# Patient Record
Sex: Male | Born: 1935 | Race: White | Hispanic: No | Marital: Married | Smoking: Former smoker
Health system: Southern US, Community
[De-identification: ages and names within clinical notes are randomized; demographics above are authoritative.]

## PROBLEM LIST (undated history)

## (undated) DIAGNOSIS — I739 Peripheral vascular disease, unspecified: Secondary | ICD-10-CM

## (undated) DIAGNOSIS — E039 Hypothyroidism, unspecified: Secondary | ICD-10-CM

## (undated) DIAGNOSIS — N289 Disorder of kidney and ureter, unspecified: Secondary | ICD-10-CM

## (undated) DIAGNOSIS — C189 Malignant neoplasm of colon, unspecified: Secondary | ICD-10-CM

## (undated) DIAGNOSIS — K5790 Diverticulosis of intestine, part unspecified, without perforation or abscess without bleeding: Secondary | ICD-10-CM

## (undated) DIAGNOSIS — E785 Hyperlipidemia, unspecified: Secondary | ICD-10-CM

## (undated) DIAGNOSIS — I6529 Occlusion and stenosis of unspecified carotid artery: Secondary | ICD-10-CM

## (undated) DIAGNOSIS — I10 Essential (primary) hypertension: Secondary | ICD-10-CM

## (undated) DIAGNOSIS — I2510 Atherosclerotic heart disease of native coronary artery without angina pectoris: Secondary | ICD-10-CM

## (undated) DIAGNOSIS — Z8673 Personal history of transient ischemic attack (TIA), and cerebral infarction without residual deficits: Secondary | ICD-10-CM

## (undated) HISTORY — DX: Hyperlipidemia, unspecified: E78.5

## (undated) HISTORY — DX: Disorder of kidney and ureter, unspecified: N28.9

## (undated) HISTORY — PX: HEMORRHOID SURGERY: SHX153

## (undated) HISTORY — DX: Essential (primary) hypertension: I10

## (undated) HISTORY — DX: Atherosclerotic heart disease of native coronary artery without angina pectoris: I25.10

## (undated) HISTORY — DX: Occlusion and stenosis of unspecified carotid artery: I65.29

## (undated) HISTORY — DX: Hypothyroidism, unspecified: E03.9

## (undated) HISTORY — DX: Personal history of transient ischemic attack (TIA), and cerebral infarction without residual deficits: Z86.73

## (undated) HISTORY — DX: Peripheral vascular disease, unspecified: I73.9

## (undated) HISTORY — PX: CAROTID ENDARTERECTOMY: SUR193

## (undated) HISTORY — DX: Diverticulosis of intestine, part unspecified, without perforation or abscess without bleeding: K57.90

## (undated) HISTORY — DX: Malignant neoplasm of colon, unspecified: C18.9

---

## 2001-08-24 ENCOUNTER — Ambulatory Visit (HOSPITAL_COMMUNITY): Admission: RE | Admit: 2001-08-24 | Discharge: 2001-08-24 | Payer: Self-pay | Admitting: Neurosurgery

## 2001-08-24 ENCOUNTER — Encounter: Payer: Self-pay | Admitting: Neurosurgery

## 2003-12-22 ENCOUNTER — Ambulatory Visit (HOSPITAL_COMMUNITY): Admission: RE | Admit: 2003-12-22 | Discharge: 2003-12-22 | Payer: Self-pay | Admitting: Ophthalmology

## 2004-11-03 ENCOUNTER — Ambulatory Visit: Payer: Self-pay | Admitting: Cardiology

## 2004-11-11 ENCOUNTER — Ambulatory Visit: Payer: Self-pay | Admitting: Cardiology

## 2004-11-14 HISTORY — PX: CORONARY ARTERY BYPASS GRAFT: SHX141

## 2004-11-16 ENCOUNTER — Inpatient Hospital Stay (HOSPITAL_COMMUNITY): Admission: RE | Admit: 2004-11-16 | Discharge: 2004-11-16 | Payer: Self-pay | Admitting: Cardiology

## 2004-11-16 ENCOUNTER — Inpatient Hospital Stay (HOSPITAL_COMMUNITY): Admission: AD | Admit: 2004-11-16 | Discharge: 2004-11-24 | Payer: Self-pay | Admitting: Cardiology

## 2004-11-16 ENCOUNTER — Ambulatory Visit: Payer: Self-pay | Admitting: Cardiology

## 2004-11-16 ENCOUNTER — Ambulatory Visit: Payer: Self-pay | Admitting: Gastroenterology

## 2004-12-06 ENCOUNTER — Ambulatory Visit: Payer: Self-pay | Admitting: Cardiology

## 2005-01-11 ENCOUNTER — Ambulatory Visit: Payer: Self-pay | Admitting: Cardiology

## 2005-02-14 HISTORY — PX: HEMICOLECTOMY: SHX854

## 2005-04-04 ENCOUNTER — Ambulatory Visit: Payer: Self-pay | Admitting: Gastroenterology

## 2005-04-22 ENCOUNTER — Encounter (INDEPENDENT_AMBULATORY_CARE_PROVIDER_SITE_OTHER): Payer: Self-pay | Admitting: Specialist

## 2005-04-22 ENCOUNTER — Ambulatory Visit: Payer: Self-pay | Admitting: Gastroenterology

## 2005-05-02 ENCOUNTER — Ambulatory Visit: Payer: Self-pay | Admitting: Cardiology

## 2005-05-12 ENCOUNTER — Ambulatory Visit: Payer: Self-pay | Admitting: Cardiology

## 2005-06-06 ENCOUNTER — Ambulatory Visit: Payer: Self-pay | Admitting: Gastroenterology

## 2005-06-17 ENCOUNTER — Encounter (INDEPENDENT_AMBULATORY_CARE_PROVIDER_SITE_OTHER): Payer: Self-pay | Admitting: *Deleted

## 2005-06-17 ENCOUNTER — Ambulatory Visit: Payer: Self-pay | Admitting: Gastroenterology

## 2005-06-21 ENCOUNTER — Ambulatory Visit: Payer: Self-pay | Admitting: Gastroenterology

## 2005-06-22 ENCOUNTER — Ambulatory Visit: Payer: Self-pay | Admitting: Internal Medicine

## 2005-07-06 ENCOUNTER — Ambulatory Visit: Payer: Self-pay | Admitting: Cardiology

## 2005-08-01 ENCOUNTER — Encounter (INDEPENDENT_AMBULATORY_CARE_PROVIDER_SITE_OTHER): Payer: Self-pay | Admitting: Specialist

## 2005-08-01 ENCOUNTER — Inpatient Hospital Stay (HOSPITAL_COMMUNITY): Admission: RE | Admit: 2005-08-01 | Discharge: 2005-08-08 | Payer: Self-pay | Admitting: Surgery

## 2005-08-03 ENCOUNTER — Ambulatory Visit: Payer: Self-pay | Admitting: Internal Medicine

## 2006-05-15 ENCOUNTER — Ambulatory Visit: Payer: Self-pay | Admitting: Cardiology

## 2006-06-05 ENCOUNTER — Ambulatory Visit: Payer: Self-pay | Admitting: Gastroenterology

## 2006-06-29 ENCOUNTER — Emergency Department (HOSPITAL_COMMUNITY): Admission: EM | Admit: 2006-06-29 | Discharge: 2006-06-29 | Payer: Self-pay | Admitting: Emergency Medicine

## 2006-07-04 ENCOUNTER — Encounter: Payer: Self-pay | Admitting: Gastroenterology

## 2006-07-11 ENCOUNTER — Ambulatory Visit: Payer: Self-pay | Admitting: Gastroenterology

## 2006-07-31 ENCOUNTER — Encounter: Payer: Self-pay | Admitting: Gastroenterology

## 2006-07-31 ENCOUNTER — Ambulatory Visit: Payer: Self-pay | Admitting: Gastroenterology

## 2006-12-22 ENCOUNTER — Ambulatory Visit: Payer: Self-pay | Admitting: Cardiology

## 2006-12-28 ENCOUNTER — Encounter: Payer: Self-pay | Admitting: Urology

## 2006-12-28 ENCOUNTER — Ambulatory Visit (HOSPITAL_BASED_OUTPATIENT_CLINIC_OR_DEPARTMENT_OTHER): Admission: RE | Admit: 2006-12-28 | Discharge: 2006-12-28 | Payer: Self-pay | Admitting: Urology

## 2007-05-30 ENCOUNTER — Encounter: Payer: Self-pay | Admitting: Cardiology

## 2007-07-10 ENCOUNTER — Encounter: Payer: Self-pay | Admitting: Physician Assistant

## 2007-07-10 ENCOUNTER — Ambulatory Visit: Payer: Self-pay | Admitting: Cardiology

## 2007-09-18 ENCOUNTER — Encounter: Payer: Self-pay | Admitting: Cardiology

## 2007-11-07 ENCOUNTER — Encounter: Payer: Self-pay | Admitting: Cardiology

## 2007-12-06 ENCOUNTER — Encounter: Payer: Self-pay | Admitting: Cardiology

## 2007-12-17 ENCOUNTER — Encounter: Payer: Self-pay | Admitting: Cardiology

## 2007-12-19 ENCOUNTER — Encounter: Payer: Self-pay | Admitting: Gastroenterology

## 2008-02-22 ENCOUNTER — Ambulatory Visit: Payer: Self-pay | Admitting: Cardiology

## 2008-07-09 ENCOUNTER — Encounter (INDEPENDENT_AMBULATORY_CARE_PROVIDER_SITE_OTHER): Payer: Self-pay | Admitting: *Deleted

## 2008-07-23 ENCOUNTER — Ambulatory Visit: Payer: Self-pay | Admitting: Gastroenterology

## 2008-07-29 ENCOUNTER — Encounter: Payer: Self-pay | Admitting: Cardiology

## 2008-08-07 ENCOUNTER — Ambulatory Visit (HOSPITAL_COMMUNITY): Admission: RE | Admit: 2008-08-07 | Discharge: 2008-08-07 | Payer: Self-pay | Admitting: Internal Medicine

## 2008-08-12 ENCOUNTER — Telehealth: Payer: Self-pay | Admitting: Gastroenterology

## 2008-08-20 ENCOUNTER — Encounter: Payer: Self-pay | Admitting: Gastroenterology

## 2008-08-20 ENCOUNTER — Ambulatory Visit: Payer: Self-pay | Admitting: Gastroenterology

## 2008-08-22 ENCOUNTER — Encounter: Payer: Self-pay | Admitting: Gastroenterology

## 2008-11-05 ENCOUNTER — Encounter: Payer: Self-pay | Admitting: Cardiology

## 2008-11-13 ENCOUNTER — Ambulatory Visit: Payer: Self-pay | Admitting: Cardiology

## 2008-11-13 ENCOUNTER — Encounter: Payer: Self-pay | Admitting: Physician Assistant

## 2008-11-14 ENCOUNTER — Encounter: Payer: Self-pay | Admitting: Physician Assistant

## 2008-11-14 ENCOUNTER — Encounter (INDEPENDENT_AMBULATORY_CARE_PROVIDER_SITE_OTHER): Payer: Self-pay | Admitting: *Deleted

## 2008-11-14 ENCOUNTER — Ambulatory Visit: Payer: Self-pay | Admitting: Cardiology

## 2008-11-14 DIAGNOSIS — E785 Hyperlipidemia, unspecified: Secondary | ICD-10-CM | POA: Insufficient documentation

## 2008-11-14 DIAGNOSIS — I359 Nonrheumatic aortic valve disorder, unspecified: Secondary | ICD-10-CM | POA: Insufficient documentation

## 2008-11-14 DIAGNOSIS — E039 Hypothyroidism, unspecified: Secondary | ICD-10-CM | POA: Insufficient documentation

## 2008-11-14 DIAGNOSIS — I10 Essential (primary) hypertension: Secondary | ICD-10-CM

## 2008-11-14 DIAGNOSIS — I25810 Atherosclerosis of coronary artery bypass graft(s) without angina pectoris: Secondary | ICD-10-CM

## 2008-11-18 ENCOUNTER — Telehealth (INDEPENDENT_AMBULATORY_CARE_PROVIDER_SITE_OTHER): Payer: Self-pay | Admitting: *Deleted

## 2008-11-18 ENCOUNTER — Encounter (INDEPENDENT_AMBULATORY_CARE_PROVIDER_SITE_OTHER): Payer: Self-pay | Admitting: *Deleted

## 2008-11-19 ENCOUNTER — Ambulatory Visit: Payer: Self-pay

## 2008-11-19 ENCOUNTER — Ambulatory Visit: Payer: Self-pay | Admitting: Cardiology

## 2008-11-19 ENCOUNTER — Encounter (HOSPITAL_COMMUNITY): Admission: RE | Admit: 2008-11-19 | Discharge: 2008-11-19 | Payer: Self-pay | Admitting: Cardiology

## 2008-11-24 ENCOUNTER — Encounter (INDEPENDENT_AMBULATORY_CARE_PROVIDER_SITE_OTHER): Payer: Self-pay | Admitting: *Deleted

## 2008-12-18 ENCOUNTER — Telehealth (INDEPENDENT_AMBULATORY_CARE_PROVIDER_SITE_OTHER): Payer: Self-pay | Admitting: *Deleted

## 2008-12-22 ENCOUNTER — Ambulatory Visit: Payer: Self-pay | Admitting: Cardiology

## 2009-01-21 ENCOUNTER — Ambulatory Visit (HOSPITAL_COMMUNITY): Admission: RE | Admit: 2009-01-21 | Discharge: 2009-01-21 | Payer: Self-pay | Admitting: Surgery

## 2009-01-29 ENCOUNTER — Encounter: Payer: Self-pay | Admitting: Gastroenterology

## 2009-02-17 ENCOUNTER — Encounter: Payer: Self-pay | Admitting: Cardiology

## 2009-04-07 ENCOUNTER — Ambulatory Visit (HOSPITAL_COMMUNITY): Admission: RE | Admit: 2009-04-07 | Discharge: 2009-04-08 | Payer: Self-pay | Admitting: Surgery

## 2009-04-23 ENCOUNTER — Encounter: Payer: Self-pay | Admitting: Cardiology

## 2010-03-14 LAB — CONVERTED CEMR LAB
ALT: 18 units/L (ref 0–53)
AST: 23 units/L (ref 0–37)
Albumin: 3.8 g/dL (ref 3.5–5.2)
Alkaline Phosphatase: 73 units/L (ref 39–117)
Bilirubin, Direct: 0.1 mg/dL (ref 0.0–0.3)
Cholesterol: 135 mg/dL (ref 0–200)
HDL: 35.5 mg/dL — ABNORMAL LOW (ref >39.00)
LDL Cholesterol: 84 mg/dL (ref 0–99)
Total Bilirubin: 0.7 mg/dL (ref 0.3–1.2)
Total CHOL/HDL Ratio: 4
Total Protein: 7.4 g/dL (ref 6.0–8.3)
Triglycerides: 79 mg/dL (ref 0.0–149.0)
VLDL: 15.8 mg/dL (ref 0.0–40.0)

## 2010-03-18 NOTE — Letter (Signed)
Summary: Followup Visit / [facility]  Followup Visit / [facility]   Imported By: Lennie Odor 02/19/2009 12:50:14  _____________________________________________________________________  External Attachment:    Type:   Image     Comment:   External Document

## 2010-03-18 NOTE — Letter (Signed)
Summary: Dr Molli Hazard Tsuei's Office Note  Dr Molli Hazard Tsuei's Office Note   Imported By: Roderic Ovens 03/16/2009 16:14:23  _____________________________________________________________________  External Attachment:    Type:   Image     Comment:   External Document

## 2010-03-18 NOTE — Letter (Signed)
Summary: Dr Wilmon Arms Tsuei's Office Note  Dr Wilmon Arms Tsuei's Office Note   Imported By: Roderic Ovens 05/18/2009 12:21:16  _____________________________________________________________________  External Attachment:    Type:   Image     Comment:   External Document

## 2010-05-05 LAB — DIFFERENTIAL
Basophils Absolute: 0.1 10*3/uL (ref 0.0–0.1)
Basophils Relative: 1 % (ref 0–1)
Eosinophils Absolute: 0.2 10*3/uL (ref 0.0–0.7)
Eosinophils Relative: 3 % (ref 0–5)
Lymphocytes Relative: 23 % (ref 12–46)
Lymphs Abs: 1.4 10*3/uL (ref 0.7–4.0)
Monocytes Absolute: 0.7 10*3/uL (ref 0.1–1.0)
Monocytes Relative: 11 % (ref 3–12)
Neutro Abs: 3.9 10*3/uL (ref 1.7–7.7)
Neutrophils Relative %: 61 % (ref 43–77)

## 2010-05-05 LAB — BASIC METABOLIC PANEL
BUN: 25 mg/dL — ABNORMAL HIGH (ref 6–23)
CO2: 26 mEq/L (ref 19–32)
Calcium: 9.4 mg/dL (ref 8.4–10.5)
Chloride: 108 mEq/L (ref 96–112)
Creatinine, Ser: 1.64 mg/dL — ABNORMAL HIGH (ref 0.4–1.5)
GFR calc Af Amer: 50 mL/min — ABNORMAL LOW (ref >60)
GFR calc non Af Amer: 41 mL/min — ABNORMAL LOW (ref >60)
Glucose, Bld: 109 mg/dL — ABNORMAL HIGH (ref 70–99)
Potassium: 4.8 mEq/L (ref 3.5–5.1)
Sodium: 139 mEq/L (ref 135–145)

## 2010-05-05 LAB — CBC
HCT: 43 % (ref 39.0–52.0)
Hemoglobin: 14.8 g/dL (ref 13.0–17.0)
MCHC: 34.4 g/dL (ref 30.0–36.0)
MCV: 96.3 fL (ref 78.0–100.0)
Platelets: 140 10*3/uL — ABNORMAL LOW (ref 150–400)
RBC: 4.47 MIL/uL (ref 4.22–5.81)
RDW: 13.7 % (ref 11.5–15.5)
WBC: 6.3 10*3/uL (ref 4.0–10.5)

## 2010-05-18 LAB — CBC
HCT: 43.2 % (ref 39.0–52.0)
Hemoglobin: 14.6 g/dL (ref 13.0–17.0)
MCHC: 33.9 g/dL (ref 30.0–36.0)
MCV: 95 fL (ref 78.0–100.0)
Platelets: 149 10*3/uL — ABNORMAL LOW (ref 150–400)
RBC: 4.55 MIL/uL (ref 4.22–5.81)
RDW: 15.5 % (ref 11.5–15.5)
WBC: 6.2 10*3/uL (ref 4.0–10.5)

## 2010-05-18 LAB — DIFFERENTIAL
Basophils Absolute: 0 10*3/uL (ref 0.0–0.1)
Basophils Relative: 1 % (ref 0–1)
Eosinophils Absolute: 0.2 10*3/uL (ref 0.0–0.7)
Monocytes Relative: 10 % (ref 3–12)
Neutrophils Relative %: 66 % (ref 43–77)

## 2010-05-18 LAB — BASIC METABOLIC PANEL
BUN: 20 mg/dL (ref 6–23)
CO2: 27 mEq/L (ref 19–32)
Calcium: 9.4 mg/dL (ref 8.4–10.5)
Chloride: 108 mEq/L (ref 96–112)
Creatinine, Ser: 1.41 mg/dL (ref 0.4–1.5)
GFR calc Af Amer: 59 mL/min — ABNORMAL LOW (ref >60)
GFR calc non Af Amer: 49 mL/min — ABNORMAL LOW (ref >60)
Glucose, Bld: 107 mg/dL — ABNORMAL HIGH (ref 70–99)
Potassium: 4.8 mEq/L (ref 3.5–5.1)
Sodium: 140 mEq/L (ref 135–145)

## 2010-06-29 NOTE — Assessment & Plan Note (Signed)
[facility]                          [facility]   NAME:SCOTTNealy, Karapetian                       MRN:          [id number]  DATE:12/22/2006                            DOB:          [birth year]    Mr. Maya is doing well.  He is post coronary artery bypass grafting.  He is doing well, he is not having any chest pain.  He has carotid  disease and needs a followup Doppler.  He does have peripheral vascular  disease, but he walks.  He had cut back on his walking routine when he  had a problem with his colon, and he tells me that he has increased it  again and will work on doing better.  He is not having any syncope or  presyncope.   PAST MEDICAL HISTORY:   ALLERGIES:  He has had muscles aches from ZOCOR, but he does tolerate  Crestor.   MEDICATIONS:  1. Thyroid.  2. Aspirin (aspirin currently on hold for a prostate procedure, and      then it will be restarted).  3. Fish oil.  4. Lisinopril 40.  5. Crestor 10.  6. Nifedipine.  7. An antibiotic for tooth abscess that will be completed in a few      days.   OTHER MEDICAL PROBLEMS:  See the list below.   REVIEW OF SYSTEMS:  Recently he was actually admitted overnight with a  fever.  He received antibiotics and there was question that he had  bronchitis.  However, it turned out the next day that it clearly was  related to an abscess in a tooth that was drained, and he is on  antibiotics and doing much better.  Otherwise, his review of systems is  negative.   PHYSICAL EXAMINATION:  Blood pressure is 168/76 with a pulse of 70.  At  home his blood pressure, systolic, is normal on a repeated basis.  His  weight is 191 pounds.  The patient is oriented to person, time, and place.  Affect is normal.  HEENT:  Reveals no xanthelasma.  He has normal extraocular  motion.  The  patient has bilateral carotid bruits.  There is no jugular venous  distension.  CARDIAC EXAM:  Reveals an S1 with an  S2.  There are no clicks or  significant murmurs.  LUNGS:  Clear.  Respiratory effort is not labored.  The abdomen is soft.  He has normal bowel sounds.  He has no significant peripheral edema.   No labs are done today.   PROBLEMS:  1. History of a CVA in the past with no residual.  2. Hypertension.  He needs close attention to his blood pressure, but      he insists that it is normal at home.  3. History of disability.  4. Status post left carotid endarterectomy by Dr. Hart Rochester in the past.      He has a right and left carotid bruit today.  I do not have any      recent Dopplers and these will be ordered and be done soon.  5.  Hypothyroidism, on medication.  6. Coronary artery disease, post 4-vessel coronary artery bypass      grafting in October of 2006.  7. Ejection fraction in the 50% range by catheterization.  8. Decreased ABIs bilaterally in his legs, but he is active and not      having any rest pain.  9. History of tobacco use historically.  10.Intolerance to ZOCOR and GEMFIBROZIL, but he tolerates Crestor.  11.History of mild renal insufficiency.  12.History of a small colon cancer that was removed successfully, and      he is followed through the cancer center.   The patient is doing well.  He is stable and I will see him back in 6  months.     Luis Abed, MD, [facility]  Electronically Signed    JDK/MedQ  DD: 12/22/2006  DT: 12/23/2006  Job #: [id number]   cc:   Juanetta Beets, MD, [facility]

## 2010-06-29 NOTE — Op Note (Signed)
NAME:  Darius Barber, Darius Barber                ACCOUNT NO.:  [id number]   MEDICAL RECORD NO.:  [id number]          PATIENT TYPE:  AMB   LOCATION:  [facility]                         FACILITY:  [facility]   PHYSICIAN:  Excell Seltzer. Annabell Howells, M.D.    DATE OF BIRTH:  27-Jun-1931   DATE OF PROCEDURE:  12/28/2006  DATE OF DISCHARGE:  12/28/2006                               OPERATIVE REPORT   PROCEDURE:  1. Prostate ultrasound.  2. Ultrasound guided needle biopsy of the prostate.   PREOPERATIVE DIAGNOSIS:  Elevated PSA.   POSTOPERATIVE DIAGNOSIS:  Elevated PSA.   SURGEON:  Excell Seltzer. Annabell Howells, M.D.   ANESTHESIA:  General.   SPECIMEN:  Prostate biopsies from the right and left base, apex, and mid  prostate.   COMPLICATIONS:  None.   INDICATIONS:  Darius Barber is a 77 year old white male with an elevated PSA  who could not tolerate office prostate biopsy.   FINDINGS AND PROCEDURE:  The patient is taken to the operating room.  He  received Cipro and Rocephin.  A general anesthetic was induced.  He was  placed in the lateral decubitus position.  A probe was inserted,  scanning was performed.  Examination revealed normal seminal vesicles.  The peripheral zone was generally isoechoic without obvious hypoechoic  lesions.  The transitional zone was slightly enlarged with normal  variegated echo texture. Sagittal views revealed no additional  abnormalities.  The prostate and seminal vesicle angle was unremarkable  and prostate volume is 42 mL.  After completion of diagnostic scan, a 12  core pattern biopsy was obtained in the routine fashion without  complications.  At this point, the probe was removed, the patient's  anesthetic was reversed, he was taken to the recovery room in stable  condition.  There were no complications.      Excell Seltzer. Annabell Howells, M.D.  Electronically Signed     JJW/MEDQ  D:  12/29/2006  T:  12/30/2006  Job:  [id number]

## 2010-06-29 NOTE — Assessment & Plan Note (Signed)
[facility]                          [facility]   Darius Barber, Darius Barber                       MRN:          [id number]  DATE:07/10/2007                            DOB:          March 07, 1931    PRIMARY CARDIOLOGIST:  Luis Abed, MD, [facility]   REASON FOR VISIT:  Scheduled followup.   Since last seen here in the clinic in November 2008, Darius Barber continues  to report no exertional chest discomfort.  In fact, he states that he  has never had any.  He is now nearly 3 years out since undergoing four-  vessel CABG in October 2006 for treatment of multivessel CAD.  LV  function was preserved by cardiac catheterization.  He has not had any  subsequent ischemic testing.   The patient also has severe cerebrovascular disease, having undergone  prior left carotid endarterectomy.  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  Surveillance Dopplers, ordered by Dr. Myrtis Ser when the  patient was last seen in the clinic, did suggest progression on the left  side with estimated 70-79% stenosis.  The right ICA remained completely  occluded.   Most recent lipid profile from this past March reveals total cholesterol  150, triglyceride 106, HDL 38 and LDL 91.   The patient is on Crestor, but only at 10 mg daily.  He was not able to  tolerate 20 mg daily or in divided dose of 10 mg b.i.d., secondary to  significant muscle ache.   The patient also has known, severe peripheral vascular disease, with  previously documented ABIs of 0.5-0.6 range in October 2006.  He  continues to walk on a regular basis and denies any worsening of his  chronic intermittent claudication, which is worse on the right.   The patient has remote history of tobacco smoking.   Electrocardiogram today reveals an normal sinus rhythm at 60 beats per  minute with the LAD and poor R-wave progression and no acute changes.   MEDICATIONS:  1. Lisinopril 40 nightly.  2. Nifedipine  60 daily.  3. Crestor 10 daily.  4. Fish oil 1000 mg daily.  5. Levothyroxine 0.1255 daily.  6. Finasteride 5 daily.  7. Aspirin 81 daily   PHYSICAL EXAMINATION:  VITAL SIGNS:  Blood pressure 153/86, pulse 72,  regular. Weight 194.  GENERAL:  A 77 year old male, sitting upright, no distress.  HEENT:  Normocephalic and atraumatic.  NECK:  Palpable carotid pulses with bilateral bruits (left greater than  right); no JVD.  LUNGS:  Clear to auscultation all fields.  HEART:  Regular rhythm (S1, S2), no significant murmurs.  No rubs.  ABDOMEN:  Protuberant, nontender, intact bowel sounds.  EXTREMITIES:  Minimally palpable dorsalis pedis pulses with trace pedal  edema.  NEUROLOGIC:  No focal deficit.   IMPRESSION:  1. Multivessel coronary artery disease.      a.     Four-vessel coronary artery bypass grafting, October 2006.      b.     Ejection fraction of 50% by catheterization.  2. Severe peripheral vascular disease.  a.     Known 100% right internal carotid artery; 70-79% left       internal carotid artery stenosis by carotid Dopplers, November       2008.      b.     Status post left carotid endarterectomy.      c.     Chronic, intermittent claudication (right greater left),       treated medically.  3. Hypertension.  4. Dyslipidemia, intolerant to ZOCOR and GEMFIBROZIL.  5. History of stroke.  6. History of mild renal insufficiency.  7. Treated hypothyroidism.   PLAN:  1. Surveillance carotid Dopplers to rule out significant progression      since his previous study in November.  2. Rechallenge with increased dose of Crestor, but in divided dose of      10 mg b.i.d.  I emphasized the need to try to attain an LDL target      of 70 or less, if at all possible.  The patient is willing to try      the higher dose and we will see if he can tolerate a certain level      of myalgia at this higher dose.  He will then need followup      FLP/LFTs in 12 weeks.  3. Otherwise  continue current medication regimen for now.  However, I      am concerned with respect to his uncontrolled hypertension.  I      suggested a twice-daily blood pressure log for 2 weeks, as well as      occasional readings in public settings.  We can review this data at      time of his next visit and make further recommendations with      respect to his antihypertensive regimen.  4. Schedule return clinic followup with myself and Dr. Myrtis Ser in 6      months.  Of note, we will defer scheduling ischemic diagnostic test      at this point in time, given that the patient has      remained asymptomatic since undergoing bypass surgery in 2006.  He      will be 3 years out this coming October.      Rozell Searing, PA-C  Electronically Signed      Luis Abed, MD, [facility]  Electronically Signed   GS/MedQ  DD: 07/10/2007  DT: 07/10/2007  Job #: [id number]   cc:   Doreen Beam, MD

## 2010-06-29 NOTE — Assessment & Plan Note (Signed)
[facility]                          [facility]   NAME:Darius Barber, Darius Barber                       MRN:          [id number]  DATE:02/22/2008                            DOB:          26-Jun-1931    Darius Barber is seen for Cardiology followup.  He is actually doing well.  He underwent CABG in 2006.  He is having no significant symptoms.  He  has good LV function.  He does not need followup testing.   He does have significant carotid disease.  He is status post left  carotid endarterectomy.  He has total occlusion of the right carotid and  he has a return of 70-79% stenosis of the left.  We arranged for  followup Dopplers that were done on July 17, 2007.  That study showed no  significant change with total occlusion of the right and 70-79% stenosis  of the left.  Vertebral arteries were patent.   Today, he is doing well.   ALLERGIES:  He has felt poorly with the ZOCOR and GEMFIBROZIL.   MEDICATIONS:  See the flow sheet and the MR.   OTHER MEDICAL PROBLEMS:  See the list on the note of Jul 10, 2007.   REVIEW OF SYSTEMS:  He has no GI or GU symptoms.  He has no fevers or  chills or headaches.  There are no skin rashes.  His review of systems  otherwise is negative.   PHYSICAL EXAMINATION:  VITAL SIGNS:  Blood pressure is 145/72 with a  pulse of 79.  Weight is 201 pounds.  NEUROLOGIC:  The patient is oriented to person, time, and place.  Affect  is normal.  HEENT:  No xanthelasma.  He has normal extraocular motion.  NECK:  There is a left carotid bruit.  There is no jugular venous  distention.  LUNGS:  Clear.  Respiratory effort is not labored.  CARDIAC:  S1 and S2.  There is a 2/6 systolic murmur.  ABDOMEN:  Soft.  EXTREMITIES:  He has no peripheral edema.   PROBLEMS:  1. History of coronary artery disease with four-vessel coronary artery      bypass graft in June 2006.  2. History of ejection fraction 50%.  3. Total occlusion of the  right carotid and 70-79% stenosis of the      left internal carotid artery.  He needs followup Dopplers.  4. Status post left carotid endarterectomy.  5. Some claudication, but this is stable.  6. Hypertension, treated.  7. Dyslipidemia, on Crestor, intolerant to Zocor and gemfibrozil.  8. History of a stroke.  9. History of mild renal insufficiency.  10.Hypothyroidism, treated.   He needs continued aggressive secondary prevention.  It is time for  followup carotid Doppler, this will be done.  I will see him back in 6  months.     Luis Abed, MD, [facility]  Electronically Signed    JDK/MedQ  DD: 02/22/2008  DT: 02/23/2008  Job #: [id number]   cc:   Franchot Heidelberg, M.D.

## 2010-06-29 NOTE — Assessment & Plan Note (Signed)
 HEALTHCARE                         GASTROENTEROLOGY OFFICE NOTE   NAME:SCOTTTracen, Mahler                       MRN:          [id number]  DATE:07/11/2006                            DOB:          1931/08/07    PRIMARY CARE PHYSICIAN:  Dr. Wende Crease.   SURGEON:  Wilmon Arms. Corliss Skains, M.D.   GASTROINTESTINAL PROBLEM LIST:  Colon cancer discovered on March 2007,  colonoscopy transverse colon cancer, small.  Underwent left  hemicolectomy by Dr. Marcille Blanco, June 2007, T1 N0 M0 colon cancer.  Scheduled for a repeat colonoscopy June 2008.   INTERVAL HISTORY:  I last saw Cebert about five weeks ago.  I was  suspicious that he was having some GERD related dyspeptic symptoms.  He  began taking Protonix and did not really notice much of a difference.  Started on Gas-X and apple vinegar and said that definitely helped him.  He presented to the emergency room recently with abdominal discomforts  and chest discomforts.  Imaging studies were normal and he was given the  diagnosis of possible irritable bowel, sent home on pain medicines and  antispasmodics by the ER physician.  He empirically started himself back  on Cipro and Flagyl that he has had laying around for history of  diverticulitis.  He said shortly after starting that, his pains went  away.   CURRENT MEDICATIONS:  1. Levothyroxine.  2. Baby aspirin.  3. Lisinopril.  4. Nifedipine.  5. Rosuvastatin.  6. Cipro/Flagyl on a p.r.n. basis.  7. GlycoLax.   PHYSICAL EXAMINATION:  GENERAL APPEARANCE:  Generally well-appearing.  VITAL SIGNS:  Weight is 195 pounds, which is down 3 pounds since his  last visit.  Blood pressure 148/88, pulse 68.  ABDOMEN:  Soft, nontender, nondistended, normal bowel sounds.  EXTREMITIES:  No lower extremity edema.   ASSESSMENT/PLAN:  A 77 year old man with personal history of colon  cancer and dyspeptic symptoms.   He has been having abdominal bloating after he eats that  is generally  helped by Gas-X and apple vinegar.  He says he has a feeling that if he  could belch, that things would definitely improved.  I recommended he  take peppermint on an as needed basis.  It is not clear that he has been  having bouts of diverticulitis.  Certainly has never had CT scan  confirming this.  Do not recommend he take empiric antibiotics without  at least calling one of his physicians first.  He is  due for a colonoscopy for one year follow-up after colon cancer  resection.  We will arrange for that to be done in the next two to three  weeks.     Rachael Fee, MD  Electronically Signed    DPJ/MedQ  DD: 07/11/2006  DT: 07/11/2006  Job #: [id number]   cc:   Wilmon Arms. Tsuei, M.D.  Wende Crease, M.D.

## 2010-07-02 NOTE — Assessment & Plan Note (Signed)
[facility]                         GASTROENTEROLOGY OFFICE NOTE   NAME:SCOTTEluzer, Howdeshell                       MRN:          [id number]  DATE:06/05/2006                            DOB:          1931-07-17    PRIMARY CARE PHYSICIAN:  Dr. Wende Crease.   GASTROINTESTINAL PROBLEM LIST:  Colon cancer discovered on March 2007,  colonoscopy transverse colon cancer, small.  Underwent left  hemicolectomy by Dr. Marcille Blanco, June 2007, T1 N0 M0 colon cancer.  Scheduled for a repeat colonoscopy June 2008.   INTERVAL HISTORY:  I last saw Mr. Villacis about a year ago around the time  I initially diagnosed him with colon cancer.  Since then, he has  undergone left hemicolectomy.  This was a T1 lesion.  He required no  adjuvant chemotherapy.  He is already in our reminder system for a  colonoscopy in June 2008.  He actually comes here now for some new  discomfort.  He has mild lower GI discomfort as well as some burning in  his epigastrium, especially when he lays down.  He had lab tests and  imaging studies recently.  He tells me what sounds like a plain  abdominal film was normal.  Lab testing including a CBC and a complete  metabolic profile were all essentially normal.  He was put on omeprazole  20 mg which he has been taking 20-30 minutes prior to his breakfast  meal.  He says on that, he has definitely improved, but his symptoms are  not completely gone.  He drinks 2 large coffees daily and eats within 2-  3 hours of lying down for bed.   CURRENT MEDICATIONS:  Levothyroxine, __________, Lisinopril, nifedipine,  Rosuvastatin, omeprazole.   PHYSICAL EXAMINATION:  VITAL SIGNS:  Weight 198 pounds which is 10  pounds heavier than he was a year ago.  Blood pressure 170/82, pulse 80.  CONSTITUTIONAL:  General well-appearing.  LUNGS:  Clear to auscultation bilaterally.  ABDOMEN:  Soft, nontender, nondistended, normal bowel sounds.   ASSESSMENT/PLAN:  A 77 year old  man with personal history of colon  cancer, new likely gastroesophageal reflux disease symptoms.   I will give him samples of Protonix 40 mg.  He will take these 20-30  minutes prior to his breakfast meal.  I also recommend he cut back on  his daily caffeine and try not to eat within 2-3 hours of lying down for  bed.  We have also given him a gastroesophageal reflux disease handout  to summarize these and other lifestyle modifications.  He will return to  see me in 4 weeks' time and if his symptoms are not improved by then, we  will have to proceed with upper  endoscopy.  He has no worrisome findings of anemia, overt  gastrointestinal bleeding, or dysphagia.     Rachael Fee, MD  Electronically Signed    DPJ/MedQ  DD: 06/05/2006  DT: 06/05/2006  Job #: [id number]   cc:   Wende Crease

## 2010-07-02 NOTE — Op Note (Signed)
NAME:  Darius Barber, Darius Barber                ACCOUNT NO.:  [id number]   MEDICAL RECORD NO.:  [id number]          PATIENT TYPE:  INP   LOCATION:  0005                         FACILITY:  [facility]   PHYSICIAN:  Wilmon Arms. Corliss Skains, M.D. DATE OF BIRTH:  1931-06-21   DATE OF PROCEDURE:  DATE OF DISCHARGE:                                 OPERATIVE REPORT   PREOPERATIVE DIAGNOSIS:  Transverse colon cancer.   POSTOPERATIVE DIAGNOSIS:  Transverse colon cancer.   PROCEDURE PERFORMED:  Left hemicolectomy.   SURGEON:  Wilmon Arms. Corliss Skains, M.D.   ASSISTANT:  Dr. Kendrick Ranch.   ANESTHESIA:  General endotracheal.   INDICATIONS:  The patient is a 77 year old male who has a significant  cardiac history who presents after a colonoscopy for chronic constipation  and intermittent lower abdominal pain.  He was initially seen by Dr. Wendall Papa who performed a colonoscopy in March this year.  At that time a 12 mm  sessile polyp was removed from the transverse colon in piecemeal fashion.  He did not feel he completely removed the polyp.  Pathology at that time  showed a tubular adenoma with focal high-grade dysplasia.  He brought the  patient back on May4 for repeat colonoscopy and removed what appeared to be  residual tumor at the prior polypectomy site.  Fortunately he did  polypectomy site with [place] ink.  The pathology at that time showed  adenocarcinoma.  The patient underwent a cardiac workup for presurgical  clearance.  He presents today for resection.   DESCRIPTION OF PROCEDURE:  The patient is brought to the operating room,  placed supine position on the operating room table.  After an adequate level  of general endotracheal anesthesia was obtained, the patient's abdomen was  shaved, prepped with Betadine and draped in sterile fashion.  A Foley  catheter had been placed under sterile technique.  A time-out was then taken  assure proper patient, proper procedure.  An upper midline incision was made  just below  the xiphoid to just below the umbilicus.  The peritoneal cavity  was entered.  Exploration of abdomen showed no liver lesions.  The stomach  appeared to be normal.  We were able to thread the nasogastric tube into the  distal stomach.  We were able to identify the inked site in the transverse  colon near the splenic flexure.  The splenic flexure was fairly high.  We  inserted the Balfour retractor.  We then began mobilizing the left colon by  dividing the white line of Toldt with cautery.  Blunt dissection was used to  mobilize the colon medially.  We continued our dissection up around the  splenic flexure.  We were able mobilize this down.  The LigaSure device was  used to dissect the greater omentum off the transverse colon in the distal  half.  Once we were satisfied that we had adequately mobilized the colon, we  divided the transverse colon just distal to the middle colic artery.  This  was performed with a GIA stapler.  The LigaSure device was used to divide  the  mesentery heading distally.  Once we had an adequate distal length in  the mid descending colon, we divided the colon distally with another firing  of the GIA stapler.  The specimen was sent for pathologic examination with a  suture to marked the proximal end.  We were able to palpate a small polyp  under the inked area.  We then reinspected for hemostasis.  We created our  anastomosis by using a GIA stapler in a side-to-side functional end-to-end  anastomosis.  The enterotomy site was inspected for hemostasis and then was  closed with a TA 60 stapler.  A reinforcing crotch stitch of 2-0 silk was  placed.  The mesenteric defect was closed with figure-of-eight 2-0 silk  sutures.  The anastomosis was inspected and it was felt to be widely patent  and there was no obvious leak.  The abdomen was then thoroughly irrigated.  Hemostasis was felt to be adequate.  All the sponge, instrument and needle  counts were correct.  The fascia  was then closed with a double-stranded #1  PDS suture.  The subcutaneous tissues were then irrigated and skin staples  were used to close skin.  The patient tolerated the procedure well.  He was  extubated, brought to recovery in stable condition.  All sponge, instrument  and needle counts correct.      Wilmon Arms. Tsuei, M.D.  Electronically Signed     MKT/MEDQ  D:  08/01/2005  T:  08/02/2005  Job:  [id number]   cc:   Rachael Fee, M.D.

## 2010-07-02 NOTE — Op Note (Signed)
NAME:  Darius Barber, Darius Barber NO.:  [id number]   MEDICAL RECORD NO.:  [id number]          PATIENT TYPE:  INP   LOCATION:  2303                         FACILITY:  [facility]   PHYSICIAN:  Salvatore Decent. [name], M.D.DATE OF BIRTH:  27-Oct-1931   DATE OF PROCEDURE:  11/17/2004  DATE OF DISCHARGE:                                 OPERATIVE REPORT   PREOPERATIVE DIAGNOSIS:  Left main and three-vessel coronary disease with  exertional angina.   POSTOPERATIVE DIAGNOSIS:  Left main and three-vessel coronary disease with  exertional angina.   PROCEDURE:  Median sternotomy, extracorporeal circulation, coronary artery  bypass grafting x4 (left internal mammary artery to left anterior  descending, saphenous vein to first diagonal, saphenous vein graft to first  obtuse marginal, saphenous vein graft to posterior descending), endoscopic  vein harvest, left thigh.   SURGEON:  Salvatore Decent. [name], M.D.   ASSISTANT:  Pecola Leisure, PA   ANESTHESIA:  General.   FINDINGS:  PDA -- fair quality.  Remaining targets  -- good quality.  Vein -  - fair quality.  Mammary -- good quality.   CLINICAL NOTE:  Darius Barber is a 77 year old gentleman who presented with  exertional angina.  He saw Dr. [name] and a Cardiolite was markedly  abnormal.  He underwent cardiac catheterization on November 16, 2004 which  revealed total occlusion of his right coronary and critical stenosis of his  left main.  The patient was referred for coronary artery bypass grafting.  The indications, benefits and alternatives were discussed in detail with the  patient; he understood and accepted the risks, and agreed to proceed.   OPERATIVE NOTE:  Darius Barber was brought to the preop holding area on November 17, 2004.  There, the Anesthesia Service placed the lines for monitoring  arterial, central venous and pulmonary arterial pressure.  EKG leads were  placed for continuous telemetry.  Intravenous antibiotics were  administered.  The patient was taken to the operating room, anesthetized and intubated.  A  Foley catheter was placed.  The chest, abdomen and legs were prepped and  draped in the usual fashion.   A median sternotomy was performed and the left internal mammary artery was  harvested using standard technique.  Simultaneously, incision was made in  the medial aspect of the left leg at the level of the knee.  The greater  saphenous vein was harvested endoscopically from the left thigh.  A short  additional segment was harvested in an open fashion just below the knee  because of a branching vessel.  The vein was of fair quality.  The left  internal mammary artery was of excellent quality.  It was taken down in a  standard fashion and the patient was heparinized prior to dividing the  distal end of the vessel.   The pericardium was opened and the ascending aorta was inspected and  palpated; there was no palpable atherosclerotic disease.  The aorta was  cannulated via concentric  2-0 Ethibond pledgeted pursestring sutures.  A  dual-stage venous cannula was placed via a pursestring suture in the right  atrial appendage.  Cardiopulmonary bypass was instituted and the patient was  cooled to 32 degrees Celsius.  The coronary arteries were inspected and  anastomotic sites were chosen.  The conduits were inspected and cut to  length.  A foam pad was placed in the pericardium to protect the left  phrenic nerve.  A temperature probe was placed in the myocardial septum and  a cardioplegic cannula was placed in the ascending aorta.   The aorta was crossclamped.  The left ventricle was emptied via the aortic  root vent.  Cardiac arrest then was achieved with a combination of cold  antegrade cardioplegia and topical iced saline.  After achieving a complete  diastolic arrest and adequate myocardial septal cooling, the following  distal anastomoses were performed.   First, a reversed saphenous vein graft  was placed end-to-side to the  posterior descending; this was a 1.3-mm fair-quality target.  The vein was  of fair quality.  The anastomosis was performed with a running 7-0 Prolene  suture.  This anastomosis and all subsequent ones were probed proximally and  distally at its completion to ensure patency.  The graft did flush well and  cardioplegia was administered with good flow and good hemostasis.   Next, a reverse saphenous vein graft was placed end-to-side to the obtuse  marginal 1; this was a large dominant lateral branch; it 2 mm in diameter.  The vein graft was of better quality for this vessel.  An end-to-side  anastomosis was performed with a running 7-0 Prolene suture.  There was  excellent flow through the graft.  Cardioplegia was administered.  There was  good hemostasis.   Next, a reversed saphenous vein graft was placed end-to-side to the first  diagonal.  This was 1.5-mm vessel.  The vein was anastomosed end-to-side  with a running 7-0 Prolene suture.  There was good flow through this graft.   Next, the left internal mammary artery was brought through a window in the  pericardium.  The distal end was spatulated and it then was anastomosed end-  to-side to the LAD.  The LAD was a 2-mm vessel at the site of the  anastomosis.  The mammary was a good-quality conduit.  The anastomosis was  performed end-to-side with a running 8-0 Prolene suture.  At the completion  of the anastomosis, the bulldog clamp was briefly removed to inspect for  hemostasis; immediate and rapid septal rewarming was noted.  The bulldog  clamp was replaced.  Additional cardioplegia then was administered.   The vein grafts were cut to length.  The cardioplegic cannula was removed  from the ascending aorta and the proximal vein graft anastomoses were  performed to 4.5-mm punch aortotomies with running 6-0 Prolene sutures. After completion of the final proximal anastomosis, the patient was placed  in  Trendelenburg position.  The bulldog clamp was again removed from the  left mammary artery.  Lidocaine was administered.  The aortic root was de-  aired and the aortic crossclamp was removed.  The total crossclamp time was  74 minutes.   While the patient was being rewarmed, all proximal and distal anastomoses  were inspected for hemostasis.  The patient initially had fibrillated and  was defibrillated with a single cardioversion with 20 joules; he then was in  heart block.  Epicardial pacing wires were placed on the right ventricle and  right atrium, and the patient was DDD-paced.  When the patient had rewarmed  to a core temperature of 37  degrees Celsius, he was weaned from  cardiopulmonary bypass without difficulty.  The total bypass time was 121  minutes.   A test dose of protamine was administered and was well-tolerated.  The  atrial and aortic cannulae were removed.  The remainder of the protamine was  administered without incident.  The chest was irrigated with warm normal  saline containing 1 gram of vancomycin.  Hemostasis was achieved.  A left  pleural and 2 mediastinal chest tubes were placed through separate subcostal  incisions.  The pericardium was closed with interrupted 3-0 silk sutures; it  came together easily without tension.  The sternum was closed with heavy-  gauge interrupted stainless steel wires.  The remainder of the incisions were closed in standard fashion.  All sponge,  needle and instrument counts were correct at the end of the procedure.  The  patient remained hemodynamically stable throughout the post- bypass period  and was taken from the operating room to the surgical intensive care unit in  critical, but stable condition.           ______________________________  Salvatore Decent [name], M.D.     SCH/MEDQ  D:  11/17/2004  T:  11/18/2004  Job:  [id number]   cc:   [name], M.D.  [address]  [address]  [place]  [place] [address]   Wende Crease  MD

## 2010-07-02 NOTE — Assessment & Plan Note (Signed)
[facility]                          [facility] NOTE   Darius Barber                       MRN:          [id number]  DATE:05/15/2006                            DOB:          February 17, 1931    PRIMARY CARDIOLOGIST:  Darius Abed, MD, [facility]   REASON FOR VISIT:  Annual followup.   Since last seen in the clinic in May 2007, by Dr. Myrtis Ser, the patient  reports no interim development of signs or symptoms suggestive of  unstable angina pectoris. When last seen here in the clinic, the patient  was cleared to proceed with colon surgery, which he reports today did  take place later that same month with successful removal of colonic  polyps, one of which apparently was malignant. However, he has been  closely followed by Dr. Toney Reil at the oncology clinic here in [place] and  he has done well without the need for any adjuvant therapy with either  chemotherapy or radiation treatment.   From a clinical standpoint, the patient continues to walk on a daily  basis with his wife, [place], with no  associated chest discomfort.   The patient was seen for scheduled followup at the [facility] earlier  this morning. He obtains most of his medications there. He reports  having had a systolic blood pressure of 200 and was instructed to  double his dose of nifedipine. The patient also has had surveillance  carotid Dopplers done there, as well as followup lipid profiles, the  results of which are currently unavailable.   Of note, the does report easy bruisability on hands and arms on current  dose of aspirin. There was no evidence of overt bleeding.   Electrocardiogram today reveals NSR at 76 bpm with left axis deviation  (LAHB) and no ischemic changes.   CURRENT MEDICATIONS:  1. Aspirin 162 daily.  2. Levothyroxine 0.125 daily.  3. Nifedipine 30 daily.  4. Fish oil.  5. Lisinopril 40 daily.  6. Crestor 10 daily.   REVIEW OF SYSTEMS:  The patient has chronic bilateral intermittent  claudication (right greater than left). Remaining systems negative.   PHYSICAL EXAMINATION:  Blood pressure is 138/80, pulse 76 and regular,  weight 201.4.  GENERAL: A 77 year old male, sitting upright, in no distress.  HEENT: Normocephalic, atraumatic.  NECK: Palpable bilateral carotid pulses with bilateral bruits; left CEA  scar.  LUNGS:  Diminished breath sounds in the bases, with mild crackles in the  right lower base. No wheezes.  HEART: Regular rate and rhythm (S1, S2). No significant murmurs. Soft  S4.  ABDOMEN: Soft, nontender with intact bowel sounds.  EXTREMITIES: Non-palpable peripheral pulses with no edema and feet are  warm to touch.  NEURO: No focal deficit.   IMPRESSION:  1. Coronary artery disease, stable.      a.     Four vessel coronary artery bypass graft October 2006.      b.     Ejection fraction of 50% by catheterization.  2. Peripheral vascular disease.      a.  Status post remote left carotid endarterectomy; known 100%       occlusion proximal right ICA.      b.     ABIs 0.54 right; 0.57 left, October 2006.  3. Hypertension.  4. History of tobacco.  5. Hyperlipidemia.      a.     INTOLERANT TO ZOCOR AND GEMFIBROZIL.  6. History of stroke.  7. History of mild renal insufficiency.   PLAN:  1. Decrease aspirin to 81 daily, given complaint of easy bruisability.  2. Request carotid Doppler and lipid profile results from the      [facility].  3. Schedule return clinic followup with Dr. Zackery Barber in six      months.      Darius Searing, PA-C  Electronically Signed      Darius Abed, MD, [facility]  Electronically Signed   GS/MedQ  DD: 05/15/2006  DT: 05/15/2006  Job #: [id number]   cc:   Darius Barber

## 2010-07-02 NOTE — Discharge Summary (Signed)
NAME:  Darius Barber, Darius Barber                ACCOUNT NO.:  [id number]   MEDICAL RECORD NO.:  [id number]          PATIENT TYPE:  INP   LOCATION:  2020                         FACILITY:  [facility]   PHYSICIAN:  Salvatore Decent. [name], M.D.DATE OF BIRTH:  04/01/1931   DATE OF ADMISSION:  11/16/2004  DATE OF DISCHARGE:                                 DISCHARGE SUMMARY   ADMISSION DIAGNOSIS:  Exertional chest discomfort.   PAST MEDICAL HISTORY/DISCHARGE DIAGNOSES:  1.  Hypertension.  2.  Hyperlipidemia.  3.  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  4.  Baseline renal insufficiency with a baseline creatinine of 1.6.  5.  Status post cataract removal with lens implant.  6.  Status post hemorrhoidectomy.  7.  Status post melanoma excision.  8.  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  9.  Left carotid endarterectomy in 1989.  10. Peripheral vascular disease with AVI's  0.6 bilaterally.  Duplex      revealing right internal carotid artery occlusion and left carotid      artery patency.  11. Three vessel coronary artery disease, status post coronary artery bypass      grafting x4.  12. Postoperative anemia, stable.  13. Postoperative sternal drainage, cultures negative.  Prophylactic Keflex      was given x7 days.   ALLERGIES:  STATINS, which cause myalgias.   BRIEF HISTORY:  The patient is a 77 year old Caucasian male who presented to  Dr. Willa Barber with exertional angina.  A Cardiolite was performed, and  this was found to be markedly abnormal.  He was therefore sent for elective  cardiac cath on November 16, 2004.   HOSPITAL COURSE:  The patient was admitted and taken for a cardiac cath on  November 16, 2004, which revealed left main and three vessel coronary artery  disease, in the setting of exertional angina.  Secondary to these findings,  Dr. Charlett Lango, CVTS service was called regarding surgical  revascularization.  Dr. [name] evaluated the patient on November 16, 2004, and it was his opinion that the patient should proceed with elective  coronary artery bypass graft surgery.  The patient remained in stable  condition until he could be taken to the OR.   The patient was taken to the OR on November 17, 2004 for coronary artery  bypass grafting x4.  Endoscopic harvesting was performed on the left lower  extremity.  The left internal mammary artery was grafted to the LAD,  saphenous vein was grafted to the PDA, saphenous vein was grafted to the OM,  and saphenous vein was grafted to the diagonal.  The patient tolerated the  procedure well and was hemodynamically stable immediately postoperatively.  The patient was transferred from the OR to the ICU in stable condition.  The  patient was extubated without complication.  He woke up from anesthesia  neurologically intact.   The patient's postoperative course has progressed as expected.  On  postoperative day #1, he was afebrile with stable vital signs and maintained  a  normal sinus rhythm.  All chest tubes and invasive lines were discontinued  in a routine manner without difficulty.  All drips were weaned accordingly.  The patient's creatinine was noted to be elevated on postoperative day #1 at  1.6; however, this is his baseline.  His creatinine did continue to trend up  over the next several days with a maximum value of 2.1.  This has  subsequently continued to trend down with a current value on postoperative  day #6 of 1.7.  Also on postoperative day #1, the patient was noted to be  mildly anemic.  This has been followed and has subsequently also improved.   The patient began cardiac rehab on postoperative day #1 and has increased  his tolerance for a satisfactory level on room air, and he is ambulating  independently.   On postoperative day #4, the patient was noted to have a small amount of  purulent drainage present from the  proximal portion of the sternal incision.  The skin edges were intact, and the sternum was stable.  Wound cultures were  obtained, and these were found to be negative.  The patient was started on  Keflex and continued on local wound care.  This drainage has become  serosanguineous in nature, and the patient will complete a seven day course  of Keflex.   The patient has been volume-overloaded postoperatively and has been diuresed  accordingly.  At one point, he did require both Lasix and Zaroxylan.  His  fluid has been removed effectively, and he will continue on a short term  diuretic as an outpatient.   On postoperative day #6, the patient is without complaints with the  exception of a mildly productive cough with brownish-colored sputum.  His  malfunction has returned, and he is ambulating well.  He is afebrile with  stable vital signs and maintaining a normal sinus rhythm.  Again, his wound  culture was negative.   PHYSICAL EXAMINATION:  CARDIAC:  Regular rate and rhythm.  LUNGS:  Crackles in the lateral bases.  ABDOMEN:  Benign.  EXTREMITIES:  Left lower extremity incisions are clean, dry, and intact.  There is a small amount of serosanguineous present from the proximal portion  of the sternal incision.  The skin edges are intact.  It is mildly  indurated.  There is edema present in the left lower extremity.   The patient is in stable condition at this time.  As long as he continues to  rest in this current manner and his sternal drainage resolves, he will be  ready for discharge in the next 1-2 days, pending morning round re-  evaluation.   LABS:  BNP on November 23, 2004:  Sodium 137, potassium 3.9, BUN 33,  creatinine 1.7, glucose 131.  CBC on November 22, 2004:  White count 8.3,  hemoglobin 10.8, hematocrit 30.4, platelets 252.   CONDITION ON DISCHARGE:  Improved.   INSTRUCTIONS: 1.  Medications:  Aspirin 325 mg daily, Toprol XL 25 mg daily, gemfibrozil      600 mg b.i.d.,  Synthroid 125 mcg daily, chlorthalidone 12.5 mg daily,      folic acid 1 mg daily, Lasix 40 mg daily x5 days, potassium chloride 20      mEq daily x5 days, Keflex 500 mg b.i.d. x5 additional days, albuterol      inhaler 2 puffs q.4h. p.r.n., Tylox 1-2 q.4-6h. p.r.n. pain.  2.  Activity:  No driving.  No lifting of more than 10 pounds.  The patient      should continue daily bathing and walking exercises.  3.  Diet:  Low salt, low fat.  4.  Wound care: The patient will shower daily and clean the incision with      soap and water.  If wound problems arise, the patient should contact the      CVTS office at [phone].  5.  Follow-up appointment with Dr. Henrietta Hoover office.  Patient will be      instructed to contact his office for an appointment two weeks after      discharge.  6.  Follow up with Dr. [name] on December 10, 2004 at 12:30.      Pecola Leisure, PA    ______________________________  Salvatore Decent [name], M.D.    AY/MEDQ  D:  11/23/2004  T:  11/23/2004  Job:  [id number]   cc:   Salvatore Decent. [name], M.D.  [address]  [place]  [place] [address]   Darius Barber, M.D.  [address]  [address]  [place]  [place] [address]

## 2010-07-02 NOTE — Discharge Summary (Signed)
NAME:  Darius Barber, Darius Barber                ACCOUNT NO.:  [id number]   MEDICAL RECORD NO.:  [id number]          PATIENT TYPE:  INP   LOCATION:  1621                         FACILITY:  [facility]   PHYSICIAN:  Wilmon Arms. Corliss Skains, M.D. DATE OF BIRTH:  [birth year]   DATE OF ADMISSION:  08/01/2005  DATE OF DISCHARGE:  08/08/2005                                 DISCHARGE SUMMARY   ADMISSION DIAGNOSIS:  Colon cancer.   DISCHARGE DIAGNOSIS:  Colon cancer.   PROCEDURE PERFORMED:  Left hemicolectomy   BRIEF HISTORY:  The patient is a 77 year old male with a significant cardiac  history who presents after a recent colonoscopy for chronic constipation and  intermittent lower abdominal pain.  Dr. Wendall Papa performed this endoscopy  and removed a polyp from the transverse colon which showed tubular adenoma  with a focal high-grade dysplasia.  In May, the patient had a repeat  colonoscopy and had a residual tumor at the prior polypectomy site.  This  was biopsied; and the areas was tattooed with [place] ink.  Pathology at that  time showed adenocarcinoma.  The patient underwent a preoperative cardiac  workup; and now presents for elective excision.   DESCRIPTION OF HOSPITAL COURSE:  The patient was admitted to hospital on  08/01/2005, where he underwent an exploratory laparotomy.  No sign of  metastatic disease was identified.  The lesion was noted to be at the  splenic flexure.  He underwent a left hemicolectomy with a primary stapled  anastomosis.  Pathology showed a 0 out of 11 lymph nodes involved with  cancer.  The margins were clear.  The primary tumor size was 0.9 cm.  Postoperatively the patient reported some flatus on postop day #1.  However,  he did develop postoperative fever.  Chest x-ray revealed possible  pneumonia, so the patient was started on antibiotics.  He also had elevation  of his creatinine to 2.1.  This resolved with fluid bolus.  His fever  resolved with antibiotics.  The patient  continued passing flatus, but did  not have any bowel movements until postop day #6.  The patient also  complained of some right knee pain and orthopedics was consulted.  The  decision was made not to drain any type of perfusion.  On postop day #7 the  patient was tolerating a regular diet; and was having regular bowel  movements.  He was discharged home.   DISCHARGE INSTRUCTIONS:  Vicodin p.r.n. for pain.  Continue his course of  antibiotics until complete.  Follow-up with Dr. Corliss Skains in 2-3 weeks.      Wilmon Arms. Tsuei, M.D.  Electronically Signed     MKT/MEDQ  D:  08/30/2005  T:  08/30/2005  Job:  [id number]

## 2010-07-02 NOTE — Cardiovascular Report (Signed)
NAME:  Darius Barber, MOM NO.:  [id number]   MEDICAL RECORD NO.:  [id number]          PATIENT TYPE:  OIB   LOCATION:  1966                         FACILITY:  [facility]   PHYSICIAN:  Arturo Morton. Riley Kill, M.D. [facility] OF BIRTH:  Oct 21, 1931   DATE OF PROCEDURE:  11/16/2004  DATE OF DISCHARGE:                              CARDIAC CATHETERIZATION   INDICATIONS:  Darius Barber is a delightful 77 year old gentleman who has had  some recurrent chest pain. He presents with exertional symptoms while  cutting the grass, and he has a markedly abnormal Cardiolite scan with a  large inferior defect.  Current study was done to assess coronary anatomy.   PROCEDURES:  1.  Left heart catheterization.  2.  Selective coronary arteriography.  3.  Selective left ventriculography.  4.  Subclavian angiography.   DESCRIPTION OF PROCEDURE:  The patient was brought to the catheterization  laboratory and prepped and draped in the usual fashion. Because of a  creatinine of 1.6, we gave him IV hydration just prior to the procedure.  In  the beginning of the procedure, the systolic pressure was over 200;  therefore, 20 mg of intravenous labetalol was administered with some  improvement down to about 180. There was extensive calcification of the left  main and damping in the left main with the first injection.  There was  evidence of severe disease involving the left main with collateralization of  the distal right.  We therefore took limited shots, but we were able to see  that the left main was severely diseased, that there was high-grade disease  of the proximal circumflex and segmental disease of the LAD as well as  involvement of the second diagonal.  In addition, the right filled by  collaterals on the left injection.  One additional view was obtained  attempting to keep the catheter just outside the left main. Following this,  we also took nonselective injections of the right which demonstrated  total  occlusion.  Ventriculography was performed in the RAO projection. We also  took a shot of the subclavian to demonstrate mammary patency.   He was taken to the holding area in satisfactory clinical condition for  sheath removal.  He experienced no chest pain during the course of the  procedure.   1.  Hemodynamic data:      1.  Central aortic pressure 186/77, mean 177.      2.  Left ventricular pressure 168/16.      3.  There was no gradient on pullback across the aortic valve.  2.  Ventriculography was performed in the RAO projection. There appeared to      be hypokinesis of the inferior segment. Ejection fraction to be      estimated in the range of 50%.  3.  The right coronary artery has 70% segmental plaquing proximally and then      is totally occluded. There is evidence of bridging collaterals to the      distal vessel. There is also evidence of left-to-right collaterals.  4.  The left main is heavily calcified. There  is damping on engagement of      the left main. There is severe segmental disease of the left main with a      focal lesion of about 80% or more in the midportion of the left main.      There is also extensive calcification at the LAD left main bifurcation.  5.  The circumflex proper provides predominantly one major marginal branch.      There is a long area of 80% diffuse proximal narrowing with a fairly      focal area of high-grade disease in the proximal circumflex but a fairly      large distal vessel that appears to be fairly suitable for grafting.  6.  The LAD proper demonstrates a segmental plaquing of 50-60% overlying the      origin of the first three diagonal branches.  There is then 70-75%      narrowing just after the third diagonal takeoff. The first diagonal is      an insignificant vessel. The second diagonal is a fairly large vessel      that has calcified disease at the ostium probably of 80-90%.  Distal to      this there is a moderate-sized  vessel.  7.  The third diagonal appears without critical narrowing, is relatively      small to moderate in caliber. The distal LAD wraps the apex and appears      to be at least suitable for grafting.   CONCLUSIONS:  1.  Preserved overall LV function with inferior wall hypokinesis.  2.  Total occlusion of the right coronary artery with left-to-right and      right-to-right collaterals.  3.  Significant disease of the left main as well as disease involving both      the LAD and circumflex as described in the above text.  4.  Systemic hypertension.   DISPOSITION:  The patient has a critical left main disease as well as total  occlusion of the right coronary artery.  He has not been symptomatic at rest  and we will admit him to the CCU step-down and obtain an immediate surgical  consultation for revascularization surgery.      Arturo Morton. Riley Kill, M.D. [facility]  Electronically Signed     TDS/MEDQ  D:  11/16/2004  T:  11/16/2004  Job:  [id number]   cc:   [name], M.D.  [address]  [address]  [place]  [place] [address]   Elby Beck M.D.   [facility]

## 2010-08-27 ENCOUNTER — Ambulatory Visit (INDEPENDENT_AMBULATORY_CARE_PROVIDER_SITE_OTHER): Payer: Medicare Other | Admitting: Urology

## 2010-08-27 DIAGNOSIS — R972 Elevated prostate specific antigen [PSA]: Secondary | ICD-10-CM

## 2010-08-27 DIAGNOSIS — N401 Enlarged prostate with lower urinary tract symptoms: Secondary | ICD-10-CM

## 2010-08-27 DIAGNOSIS — N529 Male erectile dysfunction, unspecified: Secondary | ICD-10-CM

## 2010-11-23 LAB — I-STAT 8, (EC8 V) (CONVERTED LAB)
BUN: 17
Bicarbonate: 26.4 — ABNORMAL HIGH
Glucose, Bld: 101 — ABNORMAL HIGH
Hemoglobin: 17.3 — ABNORMAL HIGH
Operator id: 133231
TCO2: 28
pCO2, Ven: 49.6

## 2011-08-19 ENCOUNTER — Encounter: Payer: Self-pay | Admitting: *Deleted

## 2011-08-19 DIAGNOSIS — I639 Cerebral infarction, unspecified: Secondary | ICD-10-CM | POA: Insufficient documentation

## 2011-08-19 DIAGNOSIS — K5790 Diverticulosis of intestine, part unspecified, without perforation or abscess without bleeding: Secondary | ICD-10-CM | POA: Insufficient documentation

## 2011-08-19 DIAGNOSIS — I517 Cardiomegaly: Secondary | ICD-10-CM | POA: Insufficient documentation

## 2011-09-02 ENCOUNTER — Ambulatory Visit (INDEPENDENT_AMBULATORY_CARE_PROVIDER_SITE_OTHER): Payer: Medicare Other | Admitting: Urology

## 2011-09-02 DIAGNOSIS — N529 Male erectile dysfunction, unspecified: Secondary | ICD-10-CM

## 2011-09-02 DIAGNOSIS — N401 Enlarged prostate with lower urinary tract symptoms: Secondary | ICD-10-CM

## 2011-09-02 DIAGNOSIS — R972 Elevated prostate specific antigen [PSA]: Secondary | ICD-10-CM

## 2012-09-07 ENCOUNTER — Ambulatory Visit (INDEPENDENT_AMBULATORY_CARE_PROVIDER_SITE_OTHER): Payer: Medicare Other | Admitting: Urology

## 2012-09-07 DIAGNOSIS — N529 Male erectile dysfunction, unspecified: Secondary | ICD-10-CM

## 2012-09-07 DIAGNOSIS — N401 Enlarged prostate with lower urinary tract symptoms: Secondary | ICD-10-CM

## 2012-09-07 DIAGNOSIS — N138 Other obstructive and reflux uropathy: Secondary | ICD-10-CM

## 2013-04-25 ENCOUNTER — Ambulatory Visit (HOSPITAL_COMMUNITY)
Admission: RE | Admit: 2013-04-25 | Discharge: 2013-04-25 | Disposition: A | Discharge status: Home / Self Care | Payer: Medicare HMO | Source: Ambulatory Visit | Attending: Family Medicine | Admitting: Family Medicine

## 2013-04-25 DIAGNOSIS — I2510 Atherosclerotic heart disease of native coronary artery without angina pectoris: Secondary | ICD-10-CM

## 2013-04-25 DIAGNOSIS — R011 Cardiac murmur, unspecified: Secondary | ICD-10-CM | POA: Insufficient documentation

## 2013-04-25 DIAGNOSIS — I10 Essential (primary) hypertension: Secondary | ICD-10-CM | POA: Insufficient documentation

## 2013-04-25 DIAGNOSIS — I059 Rheumatic mitral valve disease, unspecified: Secondary | ICD-10-CM

## 2013-04-25 DIAGNOSIS — E785 Hyperlipidemia, unspecified: Secondary | ICD-10-CM | POA: Insufficient documentation

## 2013-04-25 NOTE — Progress Notes (Signed)
*  PRELIMINARY RESULTS* Echocardiogram 2D Echocardiogram has been performed.  Darius Barber, [name] 04/25/2013, 10:58 AM

## 2013-06-18 ENCOUNTER — Encounter: Payer: Self-pay | Admitting: Gastroenterology

## 2013-06-18 ENCOUNTER — Encounter: Payer: Self-pay | Admitting: Cardiology

## 2013-06-18 DIAGNOSIS — R059 Cough, unspecified: Secondary | ICD-10-CM | POA: Insufficient documentation

## 2013-06-18 DIAGNOSIS — Z789 Other specified health status: Secondary | ICD-10-CM | POA: Insufficient documentation

## 2013-06-18 DIAGNOSIS — R9430 Abnormal result of cardiovascular function study, unspecified: Secondary | ICD-10-CM | POA: Insufficient documentation

## 2013-06-18 DIAGNOSIS — N183 Chronic kidney disease, stage 3 unspecified: Secondary | ICD-10-CM | POA: Insufficient documentation

## 2013-06-18 DIAGNOSIS — Z951 Presence of aortocoronary bypass graft: Secondary | ICD-10-CM | POA: Insufficient documentation

## 2013-06-18 DIAGNOSIS — I5032 Chronic diastolic (congestive) heart failure: Secondary | ICD-10-CM | POA: Insufficient documentation

## 2013-06-18 DIAGNOSIS — E349 Endocrine disorder, unspecified: Secondary | ICD-10-CM | POA: Insufficient documentation

## 2013-06-18 DIAGNOSIS — I779 Disorder of arteries and arterioles, unspecified: Secondary | ICD-10-CM | POA: Insufficient documentation

## 2013-06-18 DIAGNOSIS — I739 Peripheral vascular disease, unspecified: Secondary | ICD-10-CM

## 2013-06-18 DIAGNOSIS — F4310 Post-traumatic stress disorder, unspecified: Secondary | ICD-10-CM | POA: Insufficient documentation

## 2013-06-18 DIAGNOSIS — C189 Malignant neoplasm of colon, unspecified: Secondary | ICD-10-CM | POA: Insufficient documentation

## 2013-06-18 DIAGNOSIS — I340 Nonrheumatic mitral (valve) insufficiency: Secondary | ICD-10-CM | POA: Insufficient documentation

## 2013-06-18 DIAGNOSIS — R05 Cough: Secondary | ICD-10-CM | POA: Insufficient documentation

## 2013-06-19 ENCOUNTER — Telehealth: Payer: Self-pay

## 2013-06-19 ENCOUNTER — Ambulatory Visit (INDEPENDENT_AMBULATORY_CARE_PROVIDER_SITE_OTHER): Payer: Medicare Other | Admitting: Cardiology

## 2013-06-19 ENCOUNTER — Encounter: Payer: Self-pay | Admitting: Cardiology

## 2013-06-19 VITALS — BP 178/72 | HR 51 | Ht 68.0 in | Wt 203.0 lb

## 2013-06-19 DIAGNOSIS — R9430 Abnormal result of cardiovascular function study, unspecified: Secondary | ICD-10-CM

## 2013-06-19 DIAGNOSIS — I517 Cardiomegaly: Secondary | ICD-10-CM

## 2013-06-19 DIAGNOSIS — I6350 Cerebral infarction due to unspecified occlusion or stenosis of unspecified cerebral artery: Secondary | ICD-10-CM

## 2013-06-19 DIAGNOSIS — I779 Disorder of arteries and arterioles, unspecified: Secondary | ICD-10-CM

## 2013-06-19 DIAGNOSIS — I639 Cerebral infarction, unspecified: Secondary | ICD-10-CM

## 2013-06-19 DIAGNOSIS — I509 Heart failure, unspecified: Secondary | ICD-10-CM

## 2013-06-19 DIAGNOSIS — I359 Nonrheumatic aortic valve disorder, unspecified: Secondary | ICD-10-CM

## 2013-06-19 DIAGNOSIS — I10 Essential (primary) hypertension: Secondary | ICD-10-CM

## 2013-06-19 DIAGNOSIS — E785 Hyperlipidemia, unspecified: Secondary | ICD-10-CM

## 2013-06-19 DIAGNOSIS — E039 Hypothyroidism, unspecified: Secondary | ICD-10-CM

## 2013-06-19 DIAGNOSIS — N183 Chronic kidney disease, stage 3 unspecified: Secondary | ICD-10-CM

## 2013-06-19 DIAGNOSIS — I25810 Atherosclerosis of coronary artery bypass graft(s) without angina pectoris: Secondary | ICD-10-CM

## 2013-06-19 DIAGNOSIS — I5032 Chronic diastolic (congestive) heart failure: Secondary | ICD-10-CM

## 2013-06-19 DIAGNOSIS — I498 Other specified cardiac arrhythmias: Secondary | ICD-10-CM

## 2013-06-19 DIAGNOSIS — R0989 Other specified symptoms and signs involving the circulatory and respiratory systems: Secondary | ICD-10-CM

## 2013-06-19 DIAGNOSIS — R001 Bradycardia, unspecified: Secondary | ICD-10-CM

## 2013-06-19 DIAGNOSIS — I739 Peripheral vascular disease, unspecified: Secondary | ICD-10-CM

## 2013-06-19 DIAGNOSIS — I2510 Atherosclerotic heart disease of native coronary artery without angina pectoris: Secondary | ICD-10-CM

## 2013-06-19 MED ORDER — LOSARTAN POTASSIUM 25 MG PO TABS: 12.5000 mg | ORAL_TABLET | Freq: Every day | ORAL | Status: DC

## 2013-06-19 MED ORDER — ATENOLOL 25 MG PO TABS: 25.0000 mg | ORAL_TABLET | Freq: Every day | ORAL | Status: DC

## 2013-06-19 NOTE — Assessment & Plan Note (Signed)
Patient has significant renal insufficiency. This probably plays a role with his volume retention also. The patient and his wife know that Dr. Pleas Koch is thinking about this very carefully. I feel that his volume status is stable. I'm concerned that if he pushes diuretics more vigorously, we may worsen his renal function.

## 2013-06-19 NOTE — Assessment & Plan Note (Signed)
Thyroid is being treated. No change in therapy.

## 2013-06-19 NOTE — Progress Notes (Signed)
Patient ID: Darius Barber, male   DOB: 16-Apr-1931, 80 y.o.   MRN: [id number]    HPI  The patient is seen today in consultation with Dr. Pleas Koch to reestablish cardiology care. He was seen last in our office in 2010. The patient has known coronary disease. There is history of CABG in the past. Most recently he has had a problem with increased edema. He had shortness of breath with this. Two-dimensional echo was done April 25, 2013. There is severe left ventricular hypertrophy. The ejection fraction is 65-70%. There is diastolic dysfunction. The patient also has renal insufficiency. With a combination of diastolic dysfunction and renal insufficiency he is been retaining fluid. Dr. Pleas Koch started the diuretic. The patient has improved. He still has some edema in his left leg. Also a small dose of losartan was started.  The patient is 80 years old. He is quite active working in his yard. He does get fatigued. His breathing is somewhat better now that his volume is under better control. He is not having chest pain.  Allergies  Allergen Reactions  . Simvastatin     REACTION: muscle aches    Current Outpatient Prescriptions  Medication Sig Dispense Refill  . ALPRAZolam (XANAX) 0.25 MG tablet Take 0.25 mg by mouth at bedtime as needed.      Marland Kitchen aspirin (ASPIRIN EC) 81 MG EC tablet Take 81 mg by mouth daily. Swallow whole.      Marland Kitchen atenolol (TENORMIN) 50 MG tablet Take 50 mg by mouth daily.       . furosemide (LASIX) 40 MG tablet Take 1.5 tablets by mouth daily.      . hydrochlorothiazide (MICROZIDE) 12.5 MG capsule Take 12.5 mg by mouth daily.      Marland Kitchen levothyroxine (SYNTHROID, LEVOTHROID) 137 MCG tablet Take 137 mcg by mouth daily before breakfast.      . lisinopril (PRINIVIL,ZESTRIL) 40 MG tablet Take 40 mg by mouth daily.      Marland Kitchen NIFEdipine (PROCARDIA XL/ADALAT-CC) 60 MG 24 hr tablet Take 60 mg by mouth daily.      . rosuvastatin (CRESTOR) 20 MG tablet Take 20 mg by mouth daily.      . sertraline  (ZOLOFT) 100 MG tablet Take 100 mg by mouth daily.      Marland Kitchen testosterone cypionate (DEPOTESTOTERONE CYPIONATE) 200 MG/ML injection Inject into the muscle as directed.        No current facility-administered medications for this visit.    History   Social History  . Marital Status: Married    Spouse Name: N/A    Number of Children: N/A  . Years of Education: N/A   Occupational History  . Not on file.   Social History Main Topics  . Smoking status: Never Smoker   . Smokeless tobacco: Not on file  . Alcohol Use: No  . Drug Use:   . Sexual Activity:    Other Topics Concern  . Not on file   Social History Narrative  . No narrative on file    No family history on file.  Past Medical History  Diagnosis Date  . Hyperlipidemia   . Cancer     colorectal  . Hypertension   . Diverticulosis   . Coronary artery disease   . Carotid artery occlusion   . Thyroid disease     hypo  . PAD (peripheral artery disease)   . Renal insufficiency, mild   . Stroke   . LVH (left ventricular hypertrophy)  mild  . Ejection fraction     Past Surgical History  Procedure Laterality Date  . Coronary artery bypass graft  oct of 2006  . Other surgical history  2010    echo EF 65%  . Hemorrhoid surgery    . Hemicolectomy  2007    Dr.Matt Tsuei  . Carotid endarterectomy      Patient Active Problem List   Diagnosis Date Noted  . Mitral regurgitation 06/18/2013  . Carotid artery disease without cerebral infarction 06/18/2013  . Hx of CABG 06/18/2013  . CKD (chronic kidney disease) stage 3, GFR 30-59 ml/min 06/18/2013  . Chronic diastolic CHF (congestive heart failure) 06/18/2013  . Statin intolerance 06/18/2013  . Cough 06/18/2013  . Testosterone deficiency 06/18/2013  . Colon cancer 06/18/2013  . PTSD (post-traumatic stress disorder) 06/18/2013  . Ejection fraction   . Diverticulosis   . Stroke   . LVH (left ventricular hypertrophy)   . HYPOTHYROIDISM 11/14/2008  .  HYPERLIPIDEMIA-MIXED 11/14/2008  . HYPERTENSION, UNSPECIFIED 11/14/2008  . CAD 11/14/2008  . AORTIC VALVE SCLEROSIS 11/14/2008    ROS   Patient denies fever, chills, headache, sweats, rash, change in vision, change in hearing, chest pain, nausea vomiting, urinary symptoms. All other systems are reviewed and are negative.  PHYSICAL EXAM   Patient is here with his wife. He answers questions well. His wife is a very reliable historian. He is oriented to person time and place. Affect is normal. Head is atraumatic. Sclerae and conjunctiva are normal. There is no jugular venous distention. Lungs are clear. Respiratory effort is not labored. Cardiac exam reveals S1 and S2. There are carotid bruits. Abdomen is soft. There is mild edema in the left leg. There no musculoskeletal deformities. There are no skin rashes.  Filed Vitals:   06/19/13 0949  BP: 178/72  Pulse: 51  Height: 5\' 8"  (1.727 m)  Weight: 203 lb (92.08 kg)   EKG is done today and reviewed by me. There is sinus bradycardia with a rate of 51.  ASSESSMENT & PLAN

## 2013-06-19 NOTE — Assessment & Plan Note (Signed)
The patient has mild valvular heart disease by echo. No further workup is needed.

## 2013-06-19 NOTE — Assessment & Plan Note (Addendum)
His ejection fraction is normal by echo March, 2015. The EF is 65-70%.  As part of today's evaluation I spent greater than 45 minutes with his total care. More than half of this time has been with direct contact with the patient and his wife discussing all aspects of his care. He came to [facility] for this visit. I will follow him up in the [place] office.

## 2013-06-19 NOTE — Addendum Note (Signed)
Addended by: Dennie Fetters on: 06/19/2013 03:53 PM   Modules accepted: Orders

## 2013-06-19 NOTE — Telephone Encounter (Signed)
Per Dr Ron Parker I called Dr Burdine's office to see if the pt has had a carotid duplex. The lady (resceptionist) that I spoke to stated she would have to ask Dr Pleas Koch and that she will call me back with answer. If the pt has not had a carotid duplex we need to order one.

## 2013-06-19 NOTE — Assessment & Plan Note (Signed)
The patient had a left carotid endarterectomy in the past. Today I hear a right carotid bruit. We will suggest a followup carotid Doppler if one has not been done recently

## 2013-06-19 NOTE — Assessment & Plan Note (Signed)
Lipids are being treated. No change in therapy.

## 2013-06-19 NOTE — Assessment & Plan Note (Signed)
There is history of a remote stroke. It affected his left side. He does not have any significant residua.

## 2013-06-19 NOTE — Assessment & Plan Note (Signed)
The patient has diastolic CHF. Careful attention his to be paid to his blood pressure. His diuretic dosing is being managed carefully. No other workup is needed at this time.

## 2013-06-19 NOTE — Assessment & Plan Note (Signed)
Systolic blood pressure was elevated today. It was not elevated in Dr. Iverson Alamin office recently. At the time of his last visit a small dose of losartan had been added. I do not feel that this is needed strictly for diastolic heart failure. However blood pressure control is very important for diastolic art failure. Initially I suggested to the patient that we stop the small dose of losartan. However we are recontacting the patient and instructing him to remain on it.

## 2013-06-19 NOTE — Assessment & Plan Note (Signed)
The patient underwent CABG 2006. Nuclear scan in 2010 revealed an EF of 57% and no ischemia. No further workup at this time. He is not having significant symptoms.

## 2013-06-19 NOTE — Assessment & Plan Note (Signed)
Heart rate today in the office is 51. We want to protect against tachycardia with his diastolic dysfunction. However he may do a little better with mild increase in heart rate. I will instruct him to lower his atenolol dose in half.

## 2013-06-19 NOTE — Telephone Encounter (Signed)
**Note De-Identified Darius Barber Obfuscation** The pt had an OV with Dr Ron Parker this morning and was advised to stop taking Losartan. After reviewing the pts chart Dr Ron Parker recommends that the pt continue to take Losartan and to decrease Atenolol to 25 mg daily. The pts wife, Darius Barber, is advised and she verbalized understanding. RX's sent to [facility] in [place] to fill per Betty's request.

## 2013-06-19 NOTE — Patient Instructions (Signed)
Your physician has recommended you make the following change in your medication: stop taking Losartan  Your physician recommends that you schedule a follow-up appointment in: 3 months in the [place] office.  Please continue to be careful of your salt and fluid intake and wear your support stockings as often as possible during the day.

## 2013-06-19 NOTE — Assessment & Plan Note (Signed)
The patient has severe left ventricular hypertrophy by echo. This plays a role with his diastolic dysfunction. There is no literature to outflow tract obstruction. Careful attention to his blood pressure is very important.

## 2013-06-21 NOTE — Telephone Encounter (Signed)
**Note De-Identified Darius Barber Obfuscation** Dr Burdine's office contacted me and stated that the pt has not had a carotid doppler.  The pts wife is advised that Dr Ron Parker wants the pt to have a carotid duplex, she verbalized understanding and agrees with plan. She is aware that someone from this office will call her to schedule Carotid duplex.

## 2013-07-09 ENCOUNTER — Encounter (INDEPENDENT_AMBULATORY_CARE_PROVIDER_SITE_OTHER): Payer: Medicare HMO

## 2013-07-09 DIAGNOSIS — I739 Peripheral vascular disease, unspecified: Principal | ICD-10-CM

## 2013-07-09 DIAGNOSIS — R0989 Other specified symptoms and signs involving the circulatory and respiratory systems: Secondary | ICD-10-CM

## 2013-07-09 DIAGNOSIS — I6529 Occlusion and stenosis of unspecified carotid artery: Secondary | ICD-10-CM

## 2013-07-09 DIAGNOSIS — I779 Disorder of arteries and arterioles, unspecified: Secondary | ICD-10-CM

## 2013-07-10 ENCOUNTER — Other Ambulatory Visit: Payer: Self-pay

## 2013-07-10 ENCOUNTER — Encounter: Payer: Self-pay | Admitting: Cardiology

## 2013-07-10 DIAGNOSIS — I779 Disorder of arteries and arterioles, unspecified: Secondary | ICD-10-CM

## 2013-07-10 NOTE — Progress Notes (Signed)
Patient ID: Darius Barber, male   DOB: September 05, 1931, 80 y.o.   MRN: [id number]  When I saw the patient in the office recently, I checked to see if he had carotid Dopplers done in any other settings. There had not been any carotid Dopplers done and one was scheduled. This was done today. The patient has total R. ICA. There is 68-03% LICA. We are in the process of arranging for early vascular surgery assessment. The patient had his left carotid endarterectomy in the past by Dr. Kellie Simmering.   [name], MD

## 2013-07-12 ENCOUNTER — Other Ambulatory Visit: Payer: Self-pay | Admitting: *Deleted

## 2013-07-12 DIAGNOSIS — I6529 Occlusion and stenosis of unspecified carotid artery: Secondary | ICD-10-CM

## 2013-07-15 ENCOUNTER — Encounter: Payer: Self-pay | Admitting: Vascular Surgery

## 2013-07-16 ENCOUNTER — Other Ambulatory Visit: Payer: Self-pay

## 2013-07-16 ENCOUNTER — Ambulatory Visit (INDEPENDENT_AMBULATORY_CARE_PROVIDER_SITE_OTHER): Payer: Medicare HMO | Admitting: Vascular Surgery

## 2013-07-16 ENCOUNTER — Encounter: Payer: Self-pay | Admitting: Vascular Surgery

## 2013-07-16 ENCOUNTER — Ambulatory Visit (HOSPITAL_COMMUNITY)
Admission: RE | Admit: 2013-07-16 | Discharge: 2013-07-16 | Disposition: A | Discharge status: Home / Self Care | Payer: Medicare HMO | Source: Ambulatory Visit | Attending: Vascular Surgery | Admitting: Vascular Surgery

## 2013-07-16 VITALS — BP 181/72 | HR 57 | Ht 68.0 in | Wt 204.0 lb

## 2013-07-16 DIAGNOSIS — I6529 Occlusion and stenosis of unspecified carotid artery: Secondary | ICD-10-CM | POA: Insufficient documentation

## 2013-07-16 NOTE — Progress Notes (Addendum)
Subjective:     Patient ID: Darius Barber, male   DOB: 07/20/1931, 80 y.o.   MRN: [id number]  HPI this 80-year-old male was referred by Dr. Jeff Katz for evaluation of severe recurrent left ICA stenosis-80-90%. This patient had left carotid endarterectomy and left carotid-vertebral anastomosis performed by me in 1989. He had a right brain TIAs he preoperatively but did well postoperatively. He had a known right ICA occlusion at that time. Since then he has denies lateralizing weakness, aphasia, amaurosis fugax, diplopia, blurred vision, or syncope. He has not had a carotid duplex exam in 5 years according to family recently had a study which revealed a high-grade left ICA stenosis and he was referred for evaluation. He does have a history of coronary artery bypass grafting in 2006 by Dr. Hendrickson.  Past Medical History  Diagnosis Date  . Hyperlipidemia   . Cancer     colorectal  . Hypertension   . Diverticulosis   . Coronary artery disease   . Carotid artery occlusion   . Thyroid disease     hypo  . PAD (peripheral artery disease)   . Renal insufficiency, mild   . Stroke   . LVH (left ventricular hypertrophy)     mild  . Ejection fraction     History  Substance Use Topics  . Smoking status: Never Smoker   . Smokeless tobacco: Not on file  . Alcohol Use: No    History reviewed. No pertinent family history.  Allergies  Allergen Reactions  . Simvastatin     REACTION: muscle aches    Current outpatient prescriptions:ALPRAZolam (XANAX) 0.25 MG tablet, Take 0.25 mg by mouth at bedtime as needed., Disp: , Rfl: ;  aspirin (ASPIRIN EC) 81 MG EC tablet, Take 81 mg by mouth daily. Swallow whole., Disp: , Rfl: ;  atenolol (TENORMIN) 25 MG tablet, Take 1 tablet (25 mg total) by mouth daily., Disp: 30 tablet, Rfl: 3;  furosemide (LASIX) 40 MG tablet, Take 1.5 tablets by mouth daily., Disp: , Rfl:  hydrochlorothiazide (MICROZIDE) 12.5 MG capsule, Take 12.5 mg by mouth daily., Disp: , Rfl:  ;  levothyroxine (SYNTHROID, LEVOTHROID) 137 MCG tablet, Take 137 mcg by mouth daily before breakfast., Disp: , Rfl: ;  lisinopril (PRINIVIL,ZESTRIL) 40 MG tablet, Take 40 mg by mouth daily., Disp: , Rfl: ;  losartan (COZAAR) 25 MG tablet, Take 0.5 tablets (12.5 mg total) by mouth daily., Disp: 30 tablet, Rfl: 3 NIFEdipine (PROCARDIA XL/ADALAT-CC) 60 MG 24 hr tablet, Take 60 mg by mouth daily., Disp: , Rfl: ;  rosuvastatin (CRESTOR) 20 MG tablet, Take 20 mg by mouth daily., Disp: , Rfl: ;  sertraline (ZOLOFT) 100 MG tablet, Take 100 mg by mouth daily., Disp: , Rfl: ;  testosterone cypionate (DEPOTESTOTERONE CYPIONATE) 200 MG/ML injection, Inject into the muscle as directed. , Disp: , Rfl:   BP 181/72  Pulse 57  Ht 5' 8" (1.727 m)  Wt 204 lb (92.534 kg)  BMI 31.03 kg/m2  SpO2 95%  Body mass index is 31.03 kg/(m^2).           Review of Systems denies chest pain, dyspnea on exertion, PND, orthopnea, hemoptysis. Does complain of leg pain with walking, swelling in his legs as the day progresses. Other systems negative and a complete review of systems     Objective:   Physical Exam BP 181/72  Pulse 57  Ht 5' 8" (1.727 m)  Wt 204 lb (92.534 kg)  BMI 31.03 kg/m2  SpO2 95%    Gen.-alert and oriented x3 in no apparent distress HEENT normal for age Lungs no rhonchi or wheezing Cardiovascular regular rhythm no murmurs carotid pulses 3+ palpable -high pitch bruit on the left side Abdomen soft nontender no palpable masses-obese Musculoskeletal free of  major deformities Skin clear -no rashes Neurologic normal Lower extremities 3+ femoral  pulses palpable bilaterally with no edema  Today I ordered a carotid duplex exam which I reviewed and interpreted and compared her recent study. Patient does have an 80-90% recurrent left ICA stenosis and a right ICA occlusion which has been present for 26 years        Assessment:     Recurrent 80-90% left ICA stenosis with known right ICA  occlusion-asymptomatic History coronary artery disease with coronary bypass grafting 2006 Status post left carotid endarterectomy and carotid vertebral anastomosis performed in 1989    Plan:     Discuss situation with patient and family and have recommended treatment of this recurrent stenosis particularly in light of contralateral right ICA occlusion. Will obtain cerebral angiography by Dr. Brabham in the near future to see if patient candidate for left carotid stenting-will need to look for carotid vertebral anastomosis which should be in the proximal common carotid artery on the left If not we'll need to discuss redo left carotid endarterectomy. Patient does have saphenous vein remaining in right leg he states he has had vein removed from the left leg for heart surgery  We will schedule cerebral angiography on Tuesday, June 16 if this can be arranged      

## 2013-07-18 ENCOUNTER — Other Ambulatory Visit: Payer: Self-pay

## 2013-07-19 ENCOUNTER — Ambulatory Visit: Payer: Medicare Other | Admitting: Cardiology

## 2013-08-01 ENCOUNTER — Encounter (HOSPITAL_COMMUNITY): Payer: Self-pay | Admitting: Pharmacy Technician

## 2013-08-05 MED ORDER — SODIUM CHLORIDE 0.9 % IV SOLN
INTRAVENOUS | Status: DC
  Administered 2013-08-06: 07:00:00 via INTRAVENOUS

## 2013-08-06 ENCOUNTER — Encounter (HOSPITAL_COMMUNITY)
Admission: RE | Disposition: A | Discharge status: Home / Self Care | Payer: Self-pay | Source: Ambulatory Visit | Attending: Surgery

## 2013-08-06 ENCOUNTER — Ambulatory Visit (HOSPITAL_COMMUNITY)
Admission: RE | Admit: 2013-08-06 | Discharge: 2013-08-06 | Disposition: A | Discharge status: Home / Self Care | Payer: Medicare HMO | Source: Ambulatory Visit | Attending: Surgery | Admitting: Surgery

## 2013-08-06 DIAGNOSIS — I658 Occlusion and stenosis of other precerebral arteries: Secondary | ICD-10-CM | POA: Insufficient documentation

## 2013-08-06 DIAGNOSIS — M7989 Other specified soft tissue disorders: Secondary | ICD-10-CM | POA: Insufficient documentation

## 2013-08-06 DIAGNOSIS — I6529 Occlusion and stenosis of unspecified carotid artery: Secondary | ICD-10-CM

## 2013-08-06 DIAGNOSIS — Z951 Presence of aortocoronary bypass graft: Secondary | ICD-10-CM | POA: Insufficient documentation

## 2013-08-06 DIAGNOSIS — M79609 Pain in unspecified limb: Secondary | ICD-10-CM | POA: Insufficient documentation

## 2013-08-06 DIAGNOSIS — K5730 Diverticulosis of large intestine without perforation or abscess without bleeding: Secondary | ICD-10-CM | POA: Insufficient documentation

## 2013-08-06 DIAGNOSIS — I2510 Atherosclerotic heart disease of native coronary artery without angina pectoris: Secondary | ICD-10-CM | POA: Insufficient documentation

## 2013-08-06 DIAGNOSIS — E039 Hypothyroidism, unspecified: Secondary | ICD-10-CM | POA: Insufficient documentation

## 2013-08-06 DIAGNOSIS — E785 Hyperlipidemia, unspecified: Secondary | ICD-10-CM | POA: Insufficient documentation

## 2013-08-06 DIAGNOSIS — I10 Essential (primary) hypertension: Secondary | ICD-10-CM | POA: Insufficient documentation

## 2013-08-06 DIAGNOSIS — Z8673 Personal history of transient ischemic attack (TIA), and cerebral infarction without residual deficits: Secondary | ICD-10-CM | POA: Insufficient documentation

## 2013-08-06 DIAGNOSIS — Z9889 Other specified postprocedural states: Secondary | ICD-10-CM | POA: Insufficient documentation

## 2013-08-06 DIAGNOSIS — I517 Cardiomegaly: Secondary | ICD-10-CM | POA: Insufficient documentation

## 2013-08-06 HISTORY — PX: CAROTID ANGIOGRAM: SHX5504

## 2013-08-06 HISTORY — PX: ARCH AORTOGRAM: SHX5501

## 2013-08-06 LAB — POCT I-STAT, CHEM 8
BUN: 38 mg/dL — AB (ref 6–23)
CALCIUM ION: 1.16 mmol/L (ref 1.13–1.30)
CREATININE: 2.3 mg/dL — AB (ref 0.50–1.35)
Chloride: 99 mEq/L (ref 96–112)
GLUCOSE: 103 mg/dL — AB (ref 70–99)
HCT: 54 % — ABNORMAL HIGH (ref 39.0–52.0)
Hemoglobin: 18.4 g/dL — ABNORMAL HIGH (ref 13.0–17.0)
Potassium: 4.1 mEq/L (ref 3.7–5.3)
Sodium: 142 mEq/L (ref 137–147)
TCO2: 29 mmol/L (ref 0–100)

## 2013-08-06 SURGERY — ARCH AORTOGRAM
Anesthesia: LOCAL

## 2013-08-06 MED ORDER — GUAIFENESIN-DM 100-10 MG/5ML PO SYRP: 15.0000 mL | ORAL_SOLUTION | ORAL | Status: DC | PRN

## 2013-08-06 MED ORDER — SODIUM CHLORIDE 0.9 % IV SOLN: 1.0000 mL/kg/h | INTRAVENOUS | Status: DC

## 2013-08-06 MED ORDER — ACETAMINOPHEN 325 MG RE SUPP: 325.0000 mg | RECTAL | Status: DC | PRN

## 2013-08-06 MED ORDER — HYDRALAZINE HCL 20 MG/ML IJ SOLN
INTRAMUSCULAR | Status: AC
  Filled 2013-08-06: qty 1

## 2013-08-06 MED ORDER — METOPROLOL TARTRATE 1 MG/ML IV SOLN
2.0000 mg | INTRAVENOUS | Status: DC | PRN
Start: 2013-08-06 — End: 2013-08-06

## 2013-08-06 MED ORDER — PHENOL 1.4 % MT LIQD: 1.0000 | OROMUCOSAL | Status: DC | PRN

## 2013-08-06 MED ORDER — ONDANSETRON HCL 4 MG/2ML IJ SOLN: 4.0000 mg | Freq: Four times a day (QID) | INTRAMUSCULAR | Status: DC | PRN

## 2013-08-06 MED ORDER — ACETAMINOPHEN 325 MG PO TABS: 325.0000 mg | ORAL_TABLET | ORAL | Status: DC | PRN

## 2013-08-06 MED ORDER — ALUM & MAG HYDROXIDE-SIMETH 200-200-20 MG/5ML PO SUSP: 15.0000 mL | ORAL | Status: DC | PRN

## 2013-08-06 MED ORDER — LABETALOL HCL 5 MG/ML IV SOLN: 10.0000 mg | INTRAVENOUS | Status: DC | PRN

## 2013-08-06 MED ORDER — LIDOCAINE HCL (PF) 1 % IJ SOLN
INTRAMUSCULAR | Status: AC
  Filled 2013-08-06: qty 30

## 2013-08-06 MED ORDER — HYDRALAZINE HCL 20 MG/ML IJ SOLN
10.0000 mg | INTRAMUSCULAR | Status: DC | PRN
  Administered 2013-08-06: 10 mg via INTRAVENOUS

## 2013-08-06 MED ORDER — OXYCODONE HCL 5 MG PO TABS: 5.0000 mg | ORAL_TABLET | ORAL | Status: DC | PRN

## 2013-08-06 MED ORDER — HEPARIN (PORCINE) IN NACL 2-0.9 UNIT/ML-% IJ SOLN
INTRAMUSCULAR | Status: AC
  Filled 2013-08-06: qty 1000

## 2013-08-06 NOTE — Discharge Instructions (Signed)
Angiogram, Care After °Refer to this sheet in the next few weeks. These instructions provide you with information on caring for yourself after your procedure. Your health care provider may also give you more specific instructions. Your treatment has been planned according to current medical practices, but problems sometimes occur. Call your health care provider if you have any problems or questions after your procedure.  °WHAT TO EXPECT AFTER THE PROCEDURE °After your procedure, it is typical to have the following sensations: °· Minor discomfort or tenderness and a small bump at the catheter insertion site. The bump should usually decrease in size and tenderness within 1 to 2 weeks. °· Any bruising will usually fade within 2 to 4 weeks. °HOME CARE INSTRUCTIONS  °· You may need to keep taking blood thinners if they were prescribed for you. Only take over-the-counter or prescription medicines for pain, fever, or discomfort as directed by your health care provider. °· Do not apply powder or lotion to the site. °· Do not sit in a bathtub, swimming pool, or whirlpool for 5 to 7 days. °· You may shower 24 hours after the procedure. Remove the bandage (dressing) and gently wash the site with plain soap and water. Gently pat the site dry. °· Inspect the site at least twice daily. °· Limit your activity for the first 48 hours. Do not bend, squat, or lift anything over 20 lb (9 kg) or as directed by your health care provider. °· Do not drive home if you are discharged the day of the procedure. Have someone else drive you. Follow instructions about when you can drive or return to work. °SEEK MEDICAL CARE IF: °· You get lightheaded when standing up. °· You have drainage (other than a small amount of blood on the dressing). °· You have chills. °· You have a fever. °· You have redness, warmth, swelling, or pain at the insertion site. °SEEK IMMEDIATE MEDICAL CARE IF:  °· You develop chest pain or shortness of breath, feel faint,  or pass out. °· You have bleeding, swelling larger than a walnut, or drainage from the catheter insertion site. °· You develop pain, discoloration, coldness, or severe bruising in the leg or arm that held the catheter. °· You have heavy bleeding from the site. If this happens, hold pressure on the site and call 911. °MAKE SURE YOU: °· Understand these instructions. °· Will watch your condition. °· Will get help right away if you are not doing well or get worse. °Document Released: 08/19/2004 Document Revised: 02/05/2013 Document Reviewed: 06/25/2012 °Exit[employer]® Patient Information ©2015 [employer]. This information is not intended to replace advice given to you by your health care provider. Make sure you discuss any questions you have with your health care provider. ° °

## 2013-08-06 NOTE — Interval H&P Note (Signed)
History and Physical Interval Note:  08/06/2013 7:57 AM  Darius Barber  has presented today for surgery, with the diagnosis of carotid stenosis  The various methods of treatment have been discussed with the patient and family. After consideration of risks, benefits and other options for treatment, the patient has consented to  Procedure(s): ARCH AORTOGRAM (N/A) CAROTID ANGIOGRAM (N/A) as a surgical intervention .  The patient's history has been reviewed, patient examined, no change in status, stable for surgery.  I have reviewed the patient's chart and labs.  Questions were answered to the patient's satisfaction.     BRABHAM IV, V. WELLS

## 2013-08-06 NOTE — Op Note (Signed)
    Patient name: RUFINO STAUP MRN: [id number] DOB: [birth year] Sex: male  08/06/2013 Pre-operative Diagnosis: Recurrent left carotid stenosis Post-operative diagnosis:  Same Surgeon:  Eldridge Abrahams Procedure Performed:  1.  ultrasound-guided access, right femoral artery  2.  first order catheterization (left common carotid artery)  3.  left carotid angiogram     Indications:  The patient has a history of a left carotid endarterectomy and vertebral reimplantation to the left common carotid artery and 1989.  He has a known right carotid occlusion.  Ultrasound detected high-grade stenosis.  He is here today for further evaluation.  His creatinine is 2.3.  Procedure:  The patient was identified in the holding area and taken to room 8.  The patient was then placed supine on the table and prepped and draped in the usual sterile fashion.  A time out was called.  Ultrasound was used to evaluate the right common femoral artery.  It was patent .  A digital ultrasound image was acquired.  A micropuncture needle was used to access the right common femoral artery under ultrasound guidance.  An 018 wire was advanced without resistance and a micropuncture sheath was placed.  The 018 wire was removed and a benson wire was placed.  The micropuncture sheath was exchanged for a 5 french sheath.  Using a Berenstein 2 catheter and a Benson wire, the left common carotid artery was selected without difficulty.  The catheter tip was placed in the proximal left common carotid artery and left common carotid angiogram was performed in multiple obliquities.   Findings:  *  Left carotid artery:  The left common carotid artery is widely patent.  There is a reimplantation of the left vertebral artery near its origin.  The anastomosis is widely patent.  There is a high-grade stenosis within the left internal carotid artery, likely at the distal aspect of the previously placed patch.  This corresponds to approximately a  90% stenosis    Impression:  #1  widely patent left carotid vertebral anastomosis  #2  high-grade, greater than 90% left internal carotid artery stenosis    V. Annamarie Major, M.D. [facility] Office: [phone] Pager:  [phone]

## 2013-08-06 NOTE — H&P (View-Only) (Signed)
Subjective:     Patient ID: Darius Barber, male   DOB: [birth year], 80 y.o.   MRN: [id number]  HPI this 80 year old male was referred by Dr. [name] for evaluation of severe recurrent left ICA stenosis-80-90%. This patient had left carotid endarterectomy and left carotid-vertebral anastomosis performed by me in 1989. He had a right brain TIAs he preoperatively but did well postoperatively. He had a known right ICA occlusion at that time. Since then he has denies lateralizing weakness, aphasia, amaurosis fugax, diplopia, blurred vision, or syncope. He has not had a carotid duplex exam in 5 years according to family recently had a study which revealed a high-grade left ICA stenosis and he was referred for evaluation. He does have a history of coronary artery bypass grafting in 2006 by Dr. Roxan Hockey.  Past Medical History  Diagnosis Date  . Hyperlipidemia   . Cancer     colorectal  . Hypertension   . Diverticulosis   . Coronary artery disease   . Carotid artery occlusion   . Thyroid disease     hypo  . PAD (peripheral artery disease)   . Renal insufficiency, mild   . Stroke   . LVH (left ventricular hypertrophy)     mild  . Ejection fraction     History  Substance Use Topics  . Smoking status: Never Smoker   . Smokeless tobacco: Not on file  . Alcohol Use: No    History reviewed. No pertinent family history.  Allergies  Allergen Reactions  . Simvastatin     REACTION: muscle aches    Current outpatient prescriptions:ALPRAZolam (XANAX) 0.25 MG tablet, Take 0.25 mg by mouth at bedtime as needed., Disp: , Rfl: ;  aspirin (ASPIRIN EC) 81 MG EC tablet, Take 81 mg by mouth daily. Swallow whole., Disp: , Rfl: ;  atenolol (TENORMIN) 25 MG tablet, Take 1 tablet (25 mg total) by mouth daily., Disp: 30 tablet, Rfl: 3;  furosemide (LASIX) 40 MG tablet, Take 1.5 tablets by mouth daily., Disp: , Rfl:  hydrochlorothiazide (MICROZIDE) 12.5 MG capsule, Take 12.5 mg by mouth daily., Disp: , Rfl:  ;  levothyroxine (SYNTHROID, LEVOTHROID) 137 MCG tablet, Take 137 mcg by mouth daily before breakfast., Disp: , Rfl: ;  lisinopril (PRINIVIL,ZESTRIL) 40 MG tablet, Take 40 mg by mouth daily., Disp: , Rfl: ;  losartan (COZAAR) 25 MG tablet, Take 0.5 tablets (12.5 mg total) by mouth daily., Disp: 30 tablet, Rfl: 3 NIFEdipine (PROCARDIA XL/ADALAT-CC) 60 MG 24 hr tablet, Take 60 mg by mouth daily., Disp: , Rfl: ;  rosuvastatin (CRESTOR) 20 MG tablet, Take 20 mg by mouth daily., Disp: , Rfl: ;  sertraline (ZOLOFT) 100 MG tablet, Take 100 mg by mouth daily., Disp: , Rfl: ;  testosterone cypionate (DEPOTESTOTERONE CYPIONATE) 200 MG/ML injection, Inject into the muscle as directed. , Disp: , Rfl:   BP 181/72  Pulse 57  Ht 5\' 8"  (1.727 m)  Wt 204 lb (92.534 kg)  BMI 31.03 kg/m2  SpO2 95%  Body mass index is 31.03 kg/(m^2).           Review of Systems denies chest pain, dyspnea on exertion, PND, orthopnea, hemoptysis. Does complain of leg pain with walking, swelling in his legs as the day progresses. Other systems negative and a complete review of systems     Objective:   Physical Exam BP 181/72  Pulse 57  Ht 5\' 8"  (1.727 m)  Wt 204 lb (92.534 kg)  BMI 31.03 kg/m2  SpO2 95%  Gen.-alert and oriented x3 in no apparent distress HEENT normal for age Lungs no rhonchi or wheezing Cardiovascular regular rhythm no murmurs carotid pulses 3+ palpable -high pitch bruit on the left side Abdomen soft nontender no palpable masses-obese Musculoskeletal free of  major deformities Skin clear -no rashes Neurologic normal Lower extremities 3+ femoral  pulses palpable bilaterally with no edema  Today I ordered a carotid duplex exam which I reviewed and interpreted and compared her recent study. Patient does have an 80-90% recurrent left ICA stenosis and a right ICA occlusion which has been present for 26 years        Assessment:     Recurrent 80-90% left ICA stenosis with known right ICA  occlusion-asymptomatic History coronary artery disease with coronary bypass grafting 2006 Status post left carotid endarterectomy and carotid vertebral anastomosis performed in 1989    Plan:     Discuss situation with patient and family and have recommended treatment of this recurrent stenosis particularly in light of contralateral right ICA occlusion. Will obtain cerebral angiography by Dr. Trula Slade in the near future to see if patient candidate for left carotid stenting-will need to look for carotid vertebral anastomosis which should be in the proximal common carotid artery on the left If not we'll need to discuss redo left carotid endarterectomy. Patient does have saphenous vein remaining in right leg he states he has had vein removed from the left leg for heart surgery  We will schedule cerebral angiography on Tuesday, June 16 if this can be arranged

## 2013-08-08 ENCOUNTER — Other Ambulatory Visit: Payer: Self-pay

## 2013-08-20 ENCOUNTER — Encounter (HOSPITAL_COMMUNITY)
Admission: RE | Disposition: A | Discharge status: Home / Self Care | Payer: Medicare HMO | Source: Ambulatory Visit | Attending: Surgery

## 2013-08-20 ENCOUNTER — Inpatient Hospital Stay (HOSPITAL_COMMUNITY)
Admission: RE | Admit: 2013-08-20 | Discharge: 2013-08-21 | DRG: 036 | Disposition: A | Discharge status: Home / Self Care | Payer: Medicare HMO | Source: Ambulatory Visit | Attending: Surgery | Admitting: Surgery

## 2013-08-20 DIAGNOSIS — I6522 Occlusion and stenosis of left carotid artery: Secondary | ICD-10-CM

## 2013-08-20 DIAGNOSIS — Z8673 Personal history of transient ischemic attack (TIA), and cerebral infarction without residual deficits: Secondary | ICD-10-CM

## 2013-08-20 DIAGNOSIS — Z7982 Long term (current) use of aspirin: Secondary | ICD-10-CM

## 2013-08-20 DIAGNOSIS — I739 Peripheral vascular disease, unspecified: Secondary | ICD-10-CM | POA: Diagnosis present

## 2013-08-20 DIAGNOSIS — I63239 Cerebral infarction due to unspecified occlusion or stenosis of unspecified carotid arteries: Secondary | ICD-10-CM

## 2013-08-20 DIAGNOSIS — E785 Hyperlipidemia, unspecified: Secondary | ICD-10-CM | POA: Diagnosis present

## 2013-08-20 DIAGNOSIS — I6529 Occlusion and stenosis of unspecified carotid artery: Principal | ICD-10-CM | POA: Diagnosis present

## 2013-08-20 DIAGNOSIS — I2510 Atherosclerotic heart disease of native coronary artery without angina pectoris: Secondary | ICD-10-CM | POA: Diagnosis present

## 2013-08-20 DIAGNOSIS — E039 Hypothyroidism, unspecified: Secondary | ICD-10-CM | POA: Diagnosis present

## 2013-08-20 HISTORY — PX: CAROTID STENT INSERTION: SHX5505

## 2013-08-20 LAB — POCT I-STAT, CHEM 8
BUN: 33 mg/dL — AB (ref 6–23)
CALCIUM ION: 1.22 mmol/L (ref 1.13–1.30)
CHLORIDE: 98 meq/L (ref 96–112)
CREATININE: 2 mg/dL — AB (ref 0.50–1.35)
Glucose, Bld: 97 mg/dL (ref 70–99)
HCT: 54 % — ABNORMAL HIGH (ref 39.0–52.0)
Hemoglobin: 18.4 g/dL — ABNORMAL HIGH (ref 13.0–17.0)
Potassium: 4.2 mEq/L (ref 3.7–5.3)
Sodium: 142 mEq/L (ref 137–147)
TCO2: 31 mmol/L (ref 0–100)

## 2013-08-20 SURGERY — CAROTID STENT INSERTION
Anesthesia: LOCAL | Laterality: Left

## 2013-08-20 MED ORDER — NOREPINEPHRINE BITARTRATE 1 MG/ML IV SOLN
INTRAVENOUS | Status: AC
  Filled 2013-08-20: qty 4

## 2013-08-20 MED ORDER — LEVOTHYROXINE SODIUM 137 MCG PO TABS
137.0000 ug | ORAL_TABLET | Freq: Every day | ORAL | Status: DC
  Administered 2013-08-21: 137 ug via ORAL
  Filled 2013-08-20 (×2): qty 1

## 2013-08-20 MED ORDER — CLOPIDOGREL BISULFATE 75 MG PO TABS
75.0000 mg | ORAL_TABLET | Freq: Every day | ORAL | Status: DC
  Administered 2013-08-21: 75 mg via ORAL
  Filled 2013-08-20 (×2): qty 1

## 2013-08-20 MED ORDER — HYDRALAZINE HCL 20 MG/ML IJ SOLN
INTRAMUSCULAR | Status: AC
  Administered 2013-08-20: 10 mg
  Filled 2013-08-20: qty 1

## 2013-08-20 MED ORDER — ATORVASTATIN CALCIUM 20 MG PO TABS
20.0000 mg | ORAL_TABLET | Freq: Every day | ORAL | Status: DC
  Filled 2013-08-20: qty 1

## 2013-08-20 MED ORDER — NIFEDIPINE ER 60 MG PO TB24
60.0000 mg | ORAL_TABLET | Freq: Every day | ORAL | Status: DC
  Administered 2013-08-21: 60 mg via ORAL
  Filled 2013-08-20: qty 1

## 2013-08-20 MED ORDER — ASPIRIN 81 MG PO TBEC: 81.0000 mg | DELAYED_RELEASE_TABLET | Freq: Every day | ORAL | Status: DC

## 2013-08-20 MED ORDER — GUAIFENESIN-DM 100-10 MG/5ML PO SYRP: 15.0000 mL | ORAL_SOLUTION | ORAL | Status: DC | PRN

## 2013-08-20 MED ORDER — CLOPIDOGREL BISULFATE 75 MG PO TABS
75.0000 mg | ORAL_TABLET | Freq: Once | ORAL | Status: AC
  Administered 2013-08-20: 75 mg via ORAL

## 2013-08-20 MED ORDER — ACETAMINOPHEN 325 MG PO TABS
325.0000 mg | ORAL_TABLET | ORAL | Status: DC | PRN
  Administered 2013-08-20: 650 mg via ORAL
  Filled 2013-08-20: qty 2

## 2013-08-20 MED ORDER — METOPROLOL TARTRATE 1 MG/ML IV SOLN: 2.0000 mg | INTRAVENOUS | Status: DC | PRN

## 2013-08-20 MED ORDER — PHENOL 1.4 % MT LIQD: 1.0000 | OROMUCOSAL | Status: DC | PRN

## 2013-08-20 MED ORDER — ALUM & MAG HYDROXIDE-SIMETH 200-200-20 MG/5ML PO SUSP: 15.0000 mL | ORAL | Status: DC | PRN

## 2013-08-20 MED ORDER — ASPIRIN EC 81 MG PO TBEC
81.0000 mg | DELAYED_RELEASE_TABLET | Freq: Every day | ORAL | Status: DC
  Administered 2013-08-21: 81 mg via ORAL
  Filled 2013-08-20: qty 1

## 2013-08-20 MED ORDER — LABETALOL HCL 5 MG/ML IV SOLN: 10.0000 mg | INTRAVENOUS | Status: DC | PRN

## 2013-08-20 MED ORDER — ALPRAZOLAM 0.25 MG PO TABS: 0.2500 mg | ORAL_TABLET | Freq: Every evening | ORAL | Status: DC | PRN

## 2013-08-20 MED ORDER — HEPARIN (PORCINE) IN NACL 2-0.9 UNIT/ML-% IJ SOLN
INTRAMUSCULAR | Status: AC
  Filled 2013-08-20: qty 1000

## 2013-08-20 MED ORDER — TRAZODONE HCL 150 MG PO TABS
150.0000 mg | ORAL_TABLET | Freq: Every day | ORAL | Status: DC
  Administered 2013-08-20: 150 mg via ORAL
  Filled 2013-08-20 (×2): qty 1

## 2013-08-20 MED ORDER — BIVALIRUDIN 250 MG IV SOLR
INTRAVENOUS | Status: AC
  Filled 2013-08-20: qty 250

## 2013-08-20 MED ORDER — ONDANSETRON HCL 4 MG/2ML IJ SOLN: 4.0000 mg | Freq: Four times a day (QID) | INTRAMUSCULAR | Status: DC | PRN

## 2013-08-20 MED ORDER — ATENOLOL 25 MG PO TABS
25.0000 mg | ORAL_TABLET | Freq: Every day | ORAL | Status: DC
  Administered 2013-08-21: 25 mg via ORAL
  Filled 2013-08-20: qty 1

## 2013-08-20 MED ORDER — ATROPINE SULFATE 0.1 MG/ML IJ SOLN
INTRAMUSCULAR | Status: AC
  Filled 2013-08-20: qty 10

## 2013-08-20 MED ORDER — FUROSEMIDE 40 MG PO TABS
60.0000 mg | ORAL_TABLET | Freq: Every day | ORAL | Status: DC
  Administered 2013-08-20 – 2013-08-21 (×2): 60 mg via ORAL
  Filled 2013-08-20 (×2): qty 1

## 2013-08-20 MED ORDER — SERTRALINE HCL 100 MG PO TABS
100.0000 mg | ORAL_TABLET | Freq: Every day | ORAL | Status: DC
  Administered 2013-08-20 – 2013-08-21 (×2): 100 mg via ORAL
  Filled 2013-08-20 (×2): qty 1

## 2013-08-20 MED ORDER — LOSARTAN POTASSIUM 25 MG PO TABS
12.5000 mg | ORAL_TABLET | Freq: Every day | ORAL | Status: DC
  Administered 2013-08-21: 12.5 mg via ORAL
  Filled 2013-08-20: qty 0.5

## 2013-08-20 MED ORDER — SODIUM CHLORIDE 0.9 % IV SOLN: INTRAVENOUS | Status: DC

## 2013-08-20 MED ORDER — SODIUM CHLORIDE 0.9 % IV SOLN
INTRAVENOUS | Status: DC
  Administered 2013-08-20: 09:00:00 via INTRAVENOUS

## 2013-08-20 MED ORDER — TESTOSTERONE CYPIONATE 100 MG/ML IM SOLN: 100.0000 mg | INTRAMUSCULAR | Status: DC

## 2013-08-20 MED ORDER — ACETAMINOPHEN 325 MG RE SUPP
325.0000 mg | RECTAL | Status: DC | PRN
  Filled 2013-08-20: qty 2

## 2013-08-20 MED ORDER — CLOPIDOGREL BISULFATE 75 MG PO TABS
ORAL_TABLET | ORAL | Status: AC
  Administered 2013-08-20: 75 mg via ORAL
  Filled 2013-08-20: qty 1

## 2013-08-20 MED ORDER — LIDOCAINE HCL (PF) 1 % IJ SOLN
INTRAMUSCULAR | Status: AC
  Filled 2013-08-20: qty 30

## 2013-08-20 MED ORDER — HYDRALAZINE HCL 20 MG/ML IJ SOLN: 10.0000 mg | INTRAMUSCULAR | Status: DC | PRN

## 2013-08-20 NOTE — Progress Notes (Signed)
Right femoral arterial sheath removed by LMurphy,RN. 

## 2013-08-20 NOTE — Op Note (Signed)
    Patient name: Darius Barber MRN: [id number] DOB: [birth year] Sex: male  08/20/2013 Pre-operative Diagnosis: Recurrent left carotid stenosis Post-operative diagnosis:  Same Surgeon:  Eldridge Abrahams, Quay Burow Procedure Performed:  1.  ultrasound-guided access, right femoral artery  2.  left carotid stenting with distal embolic protection   Indications:  The patient has a history of left carotid endarterectomy many years ago by Dr. Kellie Simmering.  Simultaneously he had reimplantation of his left vertebral artery into the left common carotid artery.  He underwent angiography last week which identified a high-grade recurrent stenosis at the distal patch on the internal carotid artery.  He comes back in today for stenting.  Procedure:  The patient was identified in the holding area and taken to room 8.  The patient was then placed supine on the table and prepped and draped in the usual sterile fashion.  A time out was called.  Ultrasound was used to evaluate the right common femoral artery.  It was patent .  A digital ultrasound image was acquired.  A micropuncture needle was used to access the right common femoral artery under ultrasound guidance.  An 018 wire was advanced without resistance and a micropuncture sheath was placed.  The 018 wire was removed and a benson wire was placed.  The micropuncture sheath was exchanged for a 6 french sheath.  Next, a 6 French 90 cm sheath was inserted into the descending thoracic aorta.  Using a JB 1 catheter, the left common carotid artery was selected.  The patient was then given a bolus followed by continuous infusion of Angiomax.  Next, the catheter was advanced over the Bentson wire.  The sheath was then advanced over the catheter until it was in good position in the mid left common carotid artery.  Angiography was performed which identified the lesion as well as a good working view.  We then confirmed that the ACT was greater than 300.  A large NAV-6 filter  was then easily navigated across the stenosis and then fully deployed.  The stenosis was then predilated using a 3 x 20 balloon.  We selected a 9 x 40 Accu-Link stent.  This was successfully deployed in the internal/common carotid artery.  The lesion was then molded using a 5.5 balloon.  We did give 0.5 mg of atropine prior to molding of the stent.  Completion angiography was then performed this showed a widely patent internal carotid with the associated stent.  The filter was then retrieved.  I then withdrew the sheath to the proximal left carotid artery and shot an additional imaging to confirm that the vertebral artery is widely patent.  Next, the long sheath was exchanged out for a short 6 [place] sheath.  Patient was confirmed to be neurologically intact.  He was then taken to the holding area for sheath pull his coagulation profile corrects.  A total of 85 cc of contrast was utilized    Impression:  #1  Successful stenting of the left internal carotid artery with distal embolic protection.  A 9 x 40 stent was utilized.    Theotis Burrow, M.D. [facility] Office: [phone] Pager:  [phone]

## 2013-08-20 NOTE — Progress Notes (Signed)
Site area:Right groin and 5 Fr sheath removed  Site Prior to Removal:  Level 0  Pressure Applied For 25 MINUTES    Minutes Beginning at 1705  Manual:   Yes.    Patient Status During Pull:  stable  Post Pull Groin Site:  Level 0  Post Pull Instructions Given:  Yes.    Post Pull Pulses Present:  Yes.    Dressing Applied:  Yes.    Comments:  Pt remain stable during sheath pull.  Neuro status WNL.  Hemostatis achieved.  Pt transported to [facility] room 14

## 2013-08-20 NOTE — H&P (Signed)
HPI this 80 year old male was referred by Dr. [name] for evaluation of severe recurrent left ICA stenosis-80-90%. This patient had left carotid endarterectomy and left carotid-vertebral anastomosis performed by me in 1989. He had a right brain TIAs he preoperatively but did well postoperatively. He had a known right ICA occlusion at that time. Since then he has denies lateralizing weakness, aphasia, amaurosis fugax, diplopia, blurred vision, or syncope. He has not had a carotid duplex exam in 5 years according to family recently had a study which revealed a high-grade left ICA stenosis and he was referred for evaluation. He does have a history of coronary artery bypass grafting in 2006 by Dr. Roxan Hockey.  Past Medical History   Diagnosis  Date   .  Hyperlipidemia    .  Cancer      colorectal   .  Hypertension    .  Diverticulosis    .  Coronary artery disease    .  Carotid artery occlusion    .  Thyroid disease      hypo   .  PAD (peripheral artery disease)    .  Renal insufficiency, mild    .  Stroke    .  LVH (left ventricular hypertrophy)      mild   .  Ejection fraction     History   Substance Use Topics   .  Smoking status:  Never Smoker   .  Smokeless tobacco:  Not on file   .  Alcohol Use:  No   History reviewed. No pertinent family history.  Allergies   Allergen  Reactions   .  Simvastatin      REACTION: muscle aches   Current outpatient prescriptions:ALPRAZolam (XANAX) 0.25 MG tablet, Take 0.25 mg by mouth at bedtime as needed., Disp: , Rfl: ; aspirin (ASPIRIN EC) 81 MG EC tablet, Take 81 mg by mouth daily. Swallow whole., Disp: , Rfl: ; atenolol (TENORMIN) 25 MG tablet, Take 1 tablet (25 mg total) by mouth daily., Disp: 30 tablet, Rfl: 3; furosemide (LASIX) 40 MG tablet, Take 1.5 tablets by mouth daily., Disp: , Rfl:  hydrochlorothiazide (MICROZIDE) 12.5 MG capsule, Take 12.5 mg by mouth daily., Disp: , Rfl: ; levothyroxine (SYNTHROID, LEVOTHROID) 137 MCG tablet, Take  137 mcg by mouth daily before breakfast., Disp: , Rfl: ; lisinopril (PRINIVIL,ZESTRIL) 40 MG tablet, Take 40 mg by mouth daily., Disp: , Rfl: ; losartan (COZAAR) 25 MG tablet, Take 0.5 tablets (12.5 mg total) by mouth daily., Disp: 30 tablet, Rfl: 3  NIFEdipine (PROCARDIA XL/ADALAT-CC) 60 MG 24 hr tablet, Take 60 mg by mouth daily., Disp: , Rfl: ; rosuvastatin (CRESTOR) 20 MG tablet, Take 20 mg by mouth daily., Disp: , Rfl: ; sertraline (ZOLOFT) 100 MG tablet, Take 100 mg by mouth daily., Disp: , Rfl: ; testosterone cypionate (DEPOTESTOTERONE CYPIONATE) 200 MG/ML injection, Inject into the muscle as directed. , Disp: , Rfl:  BP 181/72  Pulse 57  Ht 5\' 8"  (1.727 m)  Wt 204 lb (92.534 kg)  BMI 31.03 kg/m2  SpO2 95%  Body mass index is 31.03 kg/(m^2).  Review of Systems denies chest pain, dyspnea on exertion, PND, orthopnea, hemoptysis. Does complain of leg pain with walking, swelling in his legs as the day progresses. Other systems negative and a complete review of systems  Objective:   Physical Exam BP 181/72  Pulse 57  Ht 5\' 8"  (1.727 m)  Wt 204 lb (92.534 kg)  BMI 31.03 kg/m2  SpO2 95%  Gen.-alert  and oriented x3 in no apparent distress  HEENT normal for age  Lungs no rhonchi or wheezing  Cardiovascular regular rhythm no murmurs carotid pulses 3+ palpable -high pitch bruit on the left side  Abdomen soft nontender no palpable masses-obese  Musculoskeletal free of major deformities  Skin clear -no rashes  Neurologic normal  Lower extremities 3+ femoral pulses palpable bilaterally with no edema  Today I ordered a carotid duplex exam which I reviewed and interpreted and compared her recent study. Patient does have an 80-90% recurrent left ICA stenosis and a right ICA occlusion which has been present for 26 years  Assessment:   Recurrent 80-90% left ICA stenosis with known right ICA occlusion-asymptomatic  History coronary artery disease with coronary bypass grafting 2006  Status post left  carotid endarterectomy and carotid vertebral anastomosis performed in 1989  Plan:   Discuss situation with patient and family and have recommended treatment of this recurrent stenosis particularly in light of contralateral right ICA occlusion.  Will obtain cerebral angiography by Dr. Trula Slade in the near future to see if patient candidate for left carotid stenting-will need to look for carotid vertebral anastomosis which should be in the proximal common carotid artery on the left  If not we'll need to discuss redo left carotid endarterectomy. Patient does have saphenous vein remaining in right leg he states he has had vein removed from the left leg for heart surgery    Angio shows that he is a candidate for stenting which we will proceed with.  Discussed risks and benefits including stroke  Annamarie Major

## 2013-08-20 NOTE — Progress Notes (Signed)
Pt arrived with 67fr sheath attached to transduder and sutured into rt femoral artery. Level 0. Pupils equal round and reactive. Pt able to repeat statement, hand grasp equal and feet push strong. Room air sat 81% and 2 liters nasal cannula has pt at 95%. Reinforce rt leg straight and bedrest

## 2013-08-21 ENCOUNTER — Telehealth: Payer: Self-pay | Admitting: Surgery

## 2013-08-21 ENCOUNTER — Encounter (HOSPITAL_COMMUNITY): Payer: Self-pay | Admitting: *Deleted

## 2013-08-21 ENCOUNTER — Other Ambulatory Visit: Payer: Self-pay

## 2013-08-21 DIAGNOSIS — I6529 Occlusion and stenosis of unspecified carotid artery: Secondary | ICD-10-CM

## 2013-08-21 DIAGNOSIS — Z48812 Encounter for surgical aftercare following surgery on the circulatory system: Secondary | ICD-10-CM

## 2013-08-21 LAB — POCT ACTIVATED CLOTTING TIME: ACTIVATED CLOTTING TIME: 467 s

## 2013-08-21 MED ORDER — CLOPIDOGREL BISULFATE 75 MG PO TABS: 75.0000 mg | ORAL_TABLET | Freq: Every day | ORAL | Status: DC

## 2013-08-21 MED FILL — Sodium Chloride IV Soln 0.9%: INTRAVENOUS | Qty: 50 | Status: AC

## 2013-08-21 NOTE — Progress Notes (Signed)
Darius Barber to be D/C'd Home per MD order.  Discussed with the patient and all questions fully answered.    Medication List         ALPRAZolam 0.25 MG tablet  Commonly known as:  XANAX  Take 0.25 mg by mouth at bedtime as needed.     aspirin EC 81 MG EC tablet  Generic drug:  aspirin  Take 81 mg by mouth daily. Swallow whole.     atenolol 25 MG tablet  Commonly known as:  TENORMIN  Take 1 tablet (25 mg total) by mouth daily.     clopidogrel 75 MG tablet  Commonly known as:  PLAVIX  Take 1 tablet (75 mg total) by mouth daily with breakfast.     furosemide 40 MG tablet  Commonly known as:  LASIX  Take 1.5 tablets by mouth daily.     levothyroxine 137 MCG tablet  Commonly known as:  SYNTHROID, LEVOTHROID  Take 137 mcg by mouth daily before breakfast.     losartan 25 MG tablet  Commonly known as:  COZAAR  Take 0.5 tablets (12.5 mg total) by mouth daily.     NIFEdipine 60 MG 24 hr tablet  Commonly known as:  PROCARDIA XL/ADALAT-CC  Take 60 mg by mouth daily.     rosuvastatin 20 MG tablet  Commonly known as:  CRESTOR  Take 20 mg by mouth daily.     sertraline 100 MG tablet  Commonly known as:  ZOLOFT  Take 100 mg by mouth daily.     testosterone cypionate 200 MG/ML injection  Commonly known as:  DEPOTESTOTERONE CYPIONATE  Inject 100 mg into the muscle every 14 (fourteen) days.     traZODone 150 MG tablet  Commonly known as:  DESYREL  Take 150 mg by mouth at bedtime.        VVS, Skin clean, dry and intact without evidence of skin break down, no evidence of skin tears noted. IV catheter discontinued intact. Site without signs and symptoms of complications. Dressing and pressure applied.  An After Visit Summary was printed and given to the patient.  D/c education completed with patient/family including follow up instructions, medication list, d/c activities limitations if indicated, with other d/c instructions as indicated by MD - patient able to verbalize  understanding, all questions fully answered.   Patient instructed to return to ED, call 911, or call MD for any changes in condition.   Patient escorted via [place], and D/C home via private auto.  [name] [facility] 08/21/2013 10:37 AM

## 2013-08-21 NOTE — Discharge Instructions (Signed)
Carotid Endarterectomy, Care After Refer to this sheet in the next few weeks. These instructions provide you with information on caring for yourself after your procedure. Your health care provider may also give you more specific instructions. Your treatment has been planned according to current medical practices, but problems sometimes occur. Call your health care provider if you have any problems or questions after your procedure.  WHAT TO EXPECT AFTER THE PROCEDURE You may have some pain or an ache in your neck for up to 2 weeks. This is normal. Recovery time varies depending on your age, condition, general health, and other factors. You will likely be able to return to a normal lifestyle within a few weeks.  HOME CARE INSTRUCTIONS   Take showers if your health care provider approves. Do not take tub baths or go swimming until your health care provider says it is okay.   Only take over-the-counter or prescription medicines as directed by your health care provider. If a blood thinner (anticoagulant) is prescribed after surgery, take this medicine exactly as directed.   Change bandages (dressings) as directed by your health care provider.   Avoid heavy lifting or strenuous activity until your health care provider says it is okay. Resume your normal activities as directed.   Stop smoking if you smoke. This is a risk factor for poor wound healing.   Stop taking the pill (oral contraceptives) unless your health care provider recommends otherwise.   Maintain good control of your blood pressure.   Exercise regularly or as instructed by your health care provider.   Eat a heart-healthy diet. Talk to your health care provider about how to lower blood lipids (cholesterol and triglycerides).   Follow up with your health care provider as directed. Make an appointment for the removal of stitches (sutures) or staples. SEEK MEDICAL CARE IF:   You have increased bleeding from the incision site.    You notice redness, swelling, or increasing pain at the incision site.   You notice swelling in your neck or have difficulty breathing or talking.   You notice a bad smell or pus coming from the incision site or dressing.   You have an oral temperature above 101F (38.3C).   You develop a rash.   You develop any reaction or side effects to medicine given.  SEEK IMMEDIATE MEDICAL CARE IF:   Your initial symptoms are getting worse rather than better.   You develop any abnormal bruising or bleeding.   You have difficulty breathing.   You develop chest pain, shortness of breath, or pain or swelling in your legs.   You have a return of symptoms or problems that caused you to have this surgery.   You develop a temporary loss of vision.   You develop temporary numbness on one side.   You develop a temporary inability to speak (aphasia).   You develop temporary weakness.  MAKE SURE YOU:   Understand these instructions.  Will watch your condition.  Will get help right away if you are not doing well or get worse. Document Released: 08/20/2004 Document Revised: 10/03/2012 Document Reviewed: 07/04/2012 [facility] Patient Information 2015 [place], [place]. This information is not intended to replace advice given to you by your health care provider. Make sure you discuss any questions you have with your health care provider.

## 2013-08-21 NOTE — Telephone Encounter (Signed)
Left message for patient regarding follow up appointments, dpm

## 2013-08-21 NOTE — Progress Notes (Signed)
UR Completed Kreed Kauffman Graves-Bigelow, RN,BSN [phone]  

## 2013-08-21 NOTE — Discharge Summary (Signed)
Agree with the above.  The patient is status post left carotid stent without complication.  He will followup in one month.  Annamarie Major

## 2013-08-21 NOTE — Telephone Encounter (Signed)
Message copied by Gena Fray on Wed Aug 21, 2013 12:11 PM ------      Message from: Denman George      Created: Wed Aug 21, 2013 10:51 AM      Regarding: Steph log; and needs vasc study/ov in 1 mo.                   ----- Message -----         From: Ulyses Amor, PA-C         Sent: 08/21/2013   7:43 AM           To: [facility] Charge Pool            Left carotid stent f/u with Dr. Trula Slade in 1 month and he needs a carotid duplex. ------

## 2013-08-21 NOTE — Discharge Summary (Signed)
[facility] Discharge Summary   Patient ID:  Darius Barber MRN: [id number] DOB/AGE: [birth year] 80 y.o.  Admit date: 08/20/2013 Discharge date: 08/21/2013 Date of Surgery: 08/20/2013 Surgeon: Surgeon(s): Serafina Mitchell, MD Lorretta Harp, MD  Admission Diagnosis: carotid stenosis  Discharge Diagnoses:  carotid stenosis  Secondary Diagnoses: Past Medical History  Diagnosis Date  . Hyperlipidemia   . Cancer     colorectal  . Hypertension   . Diverticulosis   . Coronary artery disease   . Carotid artery occlusion   . Thyroid disease     hypo  . PAD (peripheral artery disease)   . Renal insufficiency, mild   . Stroke   . LVH (left ventricular hypertrophy)     mild  . Ejection fraction     Procedure(s): CAROTID STENT INSERTION  Discharged Condition: good  HPI: 80 year old male with severe recurrent left ICA stenosis-80-90%.  This patient had left carotid endarterectomy and left carotid-vertebral anastomosis performed by me in 1989. He had a right brain TIAs he preoperatively but did well postoperatively. He had a known right ICA occlusion at that time. Since then he has denies lateralizing weakness, aphasia, amaurosis fugax, diplopia, blurred vision, or syncope.     Hospital Course:  Darius Barber is a 80 y.o. male is S/P Left Procedure(s): CAROTID STENT INSERTION Extubated: POD # 0 He was admitted to 3[facility] for close post-op observation.  His stay was uneventful.  He is ambulating, taking PO's well, and voiding.  No weakness of extremities, no vision changes, and no signs or symptoms of stroke. Complications:none  Consults:     Significant Diagnostic Studies: CBC Lab Results  Component Value Date   WBC 6.3 04/03/2009   HGB 18.4* 08/20/2013   HCT 54.0* 08/20/2013   MCV 96.3 04/03/2009   PLT 140* 04/03/2009    BMET    Component Value Date/Time   NA 142 08/20/2013 0842   K 4.2 08/20/2013 0842   CL 98 08/20/2013 0842   CO2 26 04/03/2009 1359    GLUCOSE 97 08/20/2013 0842   BUN 33* 08/20/2013 0842   CREATININE 2.00* 08/20/2013 0842   CALCIUM 9.4 04/03/2009 1359   GFRNONAA 41* 04/03/2009 1359   GFRAA  Value: 50        The eGFR has been calculated using the MDRD equation. This calculation has not been validated in all clinical situations. eGFR's persistently <60 mL/min signify possible Chronic Kidney Disease.* 04/03/2009 1359   COAG No results found for this basename: INR, PROTIME     Disposition:  Discharge to :Home Discharge Instructions   Call MD for:  redness, tenderness, or signs of infection (pain, swelling, bleeding, redness, odor or green/yellow discharge around incision site)    Complete by:  As directed      Call MD for:  severe or increased pain, loss or decreased feeling  in affected limb(s)    Complete by:  As directed      Call MD for:  temperature >100.5    Complete by:  As directed      Discharge instructions    Complete by:  As directed   Gradually increase you activity.  You may shower 24 hours from surgery with soap and water.     Driving Restrictions    Complete by:  As directed   No driving for 48 hours     Lifting restrictions    Complete by:  As directed   No lifting for 4 weeks  Resume previous diet    Complete by:  As directed             Medication List         ALPRAZolam 0.25 MG tablet  Commonly known as:  XANAX  Take 0.25 mg by mouth at bedtime as needed.     aspirin EC 81 MG EC tablet  Generic drug:  aspirin  Take 81 mg by mouth daily. Swallow whole.     atenolol 25 MG tablet  Commonly known as:  TENORMIN  Take 1 tablet (25 mg total) by mouth daily.     clopidogrel 75 MG tablet  Commonly known as:  PLAVIX  Take 1 tablet (75 mg total) by mouth daily with breakfast.     furosemide 40 MG tablet  Commonly known as:  LASIX  Take 1.5 tablets by mouth daily.     levothyroxine 137 MCG tablet  Commonly known as:  SYNTHROID, LEVOTHROID  Take 137 mcg by mouth daily before breakfast.      losartan 25 MG tablet  Commonly known as:  COZAAR  Take 0.5 tablets (12.5 mg total) by mouth daily.     NIFEdipine 60 MG 24 hr tablet  Commonly known as:  PROCARDIA XL/ADALAT-CC  Take 60 mg by mouth daily.     rosuvastatin 20 MG tablet  Commonly known as:  CRESTOR  Take 20 mg by mouth daily.     sertraline 100 MG tablet  Commonly known as:  ZOLOFT  Take 100 mg by mouth daily.     testosterone cypionate 200 MG/ML injection  Commonly known as:  DEPOTESTOTERONE CYPIONATE  Inject 100 mg into the muscle every 14 (fourteen) days.     traZODone 150 MG tablet  Commonly known as:  DESYREL  Take 150 mg by mouth at bedtime.       Verbal and written Discharge instructions given to the patient. Wound care per Discharge AVS     Follow-up Information   Follow up with Trula Slade IV, [place], MD In 1 month. (sent message tp office)    Specialty:  Vascular Surgery   Contact information:   [address] [place] [address] [phone]      He was given a prescription for plavix by Dr. Trula Slade.  He will f/u for carotid duplex and post-op visit in 1 month. SignedRoxy Horseman 08/21/2013, 7:48 AM

## 2013-08-21 NOTE — Progress Notes (Addendum)
[facility] [place]  Subjective  - Doing well.  Right groin is a little tender.   Objective 156/66 45 97.6 F (36.4 C) (Oral) 15 97%  Intake/Output Summary (Last 24 hours) at 08/21/13 0737 Last data filed at 08/21/13 0412  Gross per 24 hour  Intake 921.25 ml  Output    300 ml  Net 621.25 ml    Right groin is soft, slightly tender to palpation. Palpable radial pulses equal bil. Grip 5/5   Assessment/Planning: POD #1 Surgeon: Jacquelyne Balint  Procedure Performed:  1. ultrasound-guided access, right femoral artery  2. left carotid stenting with distal embolic protection F/U in 1 month with Dr. Fae Pippin, [place] 08/21/2013 7:37 AM --  Laboratory Lab Results:  Recent Labs  08/20/13 0842  HGB 18.4*  HCT 54.0*   BMET  Recent Labs  08/20/13 0842  NA 142  K 4.2  CL 98  GLUCOSE 97  BUN 33*  CREATININE 2.00*    COAG No results found for this basename: INR, PROTIME   No results found for this basename: PTT      The patient is neurologically intact, status post left carotid stent.  His right groin is soft.  He'll be discharged today  Annamarie Major

## 2013-08-21 NOTE — Plan of Care (Signed)
Problem: Phase I Progression Outcomes Goal: Vascular site scale level 0 - I Vascular Site Scale Level 0: No bruising/bleeding/hematoma Level I (Mild): Bruising/Ecchymosis, minimal bleeding/ooozing, palpable hematoma < 3 cm Level II (Moderate): Bleeding not affecting hemodynamic parameters, pseudoaneurysm, palpable hematoma > 3 cm Level III (Severe) Bleeding which affects hemodynamic parameters or retroperitoneal hemorrhage  Outcome: Completed/Met Date Met:  08/21/13 Level 0

## 2013-09-13 ENCOUNTER — Ambulatory Visit (INDEPENDENT_AMBULATORY_CARE_PROVIDER_SITE_OTHER): Payer: Medicare HMO | Admitting: Urology

## 2013-09-13 DIAGNOSIS — N401 Enlarged prostate with lower urinary tract symptoms: Secondary | ICD-10-CM

## 2013-09-13 DIAGNOSIS — N529 Male erectile dysfunction, unspecified: Secondary | ICD-10-CM

## 2013-09-16 ENCOUNTER — Encounter: Payer: Self-pay | Admitting: Cardiology

## 2013-09-16 ENCOUNTER — Ambulatory Visit (INDEPENDENT_AMBULATORY_CARE_PROVIDER_SITE_OTHER): Payer: Medicare HMO | Admitting: Cardiology

## 2013-09-16 ENCOUNTER — Ambulatory Visit (HOSPITAL_COMMUNITY)
Admission: RE | Admit: 2013-09-16 | Discharge: 2013-09-16 | Disposition: A | Discharge status: Home / Self Care | Payer: Medicare HMO | Source: Ambulatory Visit | Attending: Surgery | Admitting: Surgery

## 2013-09-16 ENCOUNTER — Other Ambulatory Visit: Payer: Self-pay | Admitting: Surgery

## 2013-09-16 VITALS — BP 186/65 | HR 52 | Ht 68.0 in | Wt 202.0 lb

## 2013-09-16 DIAGNOSIS — Z48812 Encounter for surgical aftercare following surgery on the circulatory system: Secondary | ICD-10-CM | POA: Diagnosis not present

## 2013-09-16 DIAGNOSIS — I739 Peripheral vascular disease, unspecified: Secondary | ICD-10-CM

## 2013-09-16 DIAGNOSIS — I6529 Occlusion and stenosis of unspecified carotid artery: Secondary | ICD-10-CM | POA: Insufficient documentation

## 2013-09-16 DIAGNOSIS — I2510 Atherosclerotic heart disease of native coronary artery without angina pectoris: Secondary | ICD-10-CM

## 2013-09-16 DIAGNOSIS — I779 Disorder of arteries and arterioles, unspecified: Secondary | ICD-10-CM

## 2013-09-16 DIAGNOSIS — I10 Essential (primary) hypertension: Secondary | ICD-10-CM

## 2013-09-16 NOTE — Patient Instructions (Signed)
Your physician recommends that you schedule a follow-up appointment in: 4 months. You will receive a reminder letter in the mail in about 2 months reminding you to call and schedule your appointment. If you don't receive this letter, please contact our office. Your physician recommends that you continue on your current medications as directed. Please refer to the Current Medication list given to you today. 

## 2013-09-16 NOTE — Progress Notes (Signed)
Patient ID: Darius Barber, male   DOB: [birth year], 80 y.o.   MRN: [id number]    HPI  Patient is seen today to followup his overall cardiac status. I saw him as an inpatient on Jun 19, 2013. There is a very extensive note. Decision was made to proceed with a carotid Doppler. This showed severe disease. He underwent carotid stenting. He is scheduled to be seen back for followup Doppler for this this afternoon in [place]. His blood pressures taken at home by history. The systolic tends to range in the 155 area. His medicines recently were increased.  Allergies  Allergen Reactions  . Simvastatin     REACTION: muscle aches    Current Outpatient Prescriptions  Medication Sig Dispense Refill  . ALPRAZolam (XANAX) 0.25 MG tablet Take 0.25 mg by mouth at bedtime as needed.      Marland Kitchen aspirin (ASPIRIN EC) 81 MG EC tablet Take 81 mg by mouth daily. Swallow whole.      Marland Kitchen atenolol (TENORMIN) 25 MG tablet Take 1 tablet (25 mg total) by mouth daily.  30 tablet  3  . clopidogrel (PLAVIX) 75 MG tablet Take 1 tablet (75 mg total) by mouth daily with breakfast.  30 tablet  5  . furosemide (LASIX) 40 MG tablet Take 1.5 tablets by mouth daily.      Marland Kitchen levothyroxine (SYNTHROID, LEVOTHROID) 137 MCG tablet Take 137 mcg by mouth daily before breakfast.      . losartan (COZAAR) 25 MG tablet Take 0.5 tablets (12.5 mg total) by mouth daily.  30 tablet  3  . NIFEdipine (PROCARDIA XL/ADALAT-CC) 60 MG 24 hr tablet Take 60 mg by mouth daily.      . rosuvastatin (CRESTOR) 20 MG tablet Take 20 mg by mouth daily.      . sertraline (ZOLOFT) 100 MG tablet Take 100 mg by mouth daily.      Marland Kitchen testosterone cypionate (DEPOTESTOTERONE CYPIONATE) 200 MG/ML injection Inject 100 mg into the muscle every 14 (fourteen) days.       . traZODone (DESYREL) 150 MG tablet Take 150 mg by mouth at bedtime.       No current facility-administered medications for this visit.    History   Social History  . Marital Status: Married    Spouse Name:  N/A    Number of Children: N/A  . Years of Education: N/A   Occupational History  . Not on file.   Social History Main Topics  . Smoking status: Never Smoker   . Smokeless tobacco: Not on file  . Alcohol Use: No  . Drug Use:   . Sexual Activity:    Other Topics Concern  . Not on file   Social History Narrative  . No narrative on file    No family history on file.  Past Medical History  Diagnosis Date  . Hyperlipidemia   . Cancer     colorectal  . Hypertension   . Diverticulosis   . Coronary artery disease   . Carotid artery occlusion   . Thyroid disease     hypo  . PAD (peripheral artery disease)   . Renal insufficiency, mild   . Stroke   . LVH (left ventricular hypertrophy)     mild  . Ejection fraction     Past Surgical History  Procedure Laterality Date  . Coronary artery bypass graft  oct of 2006  . Other surgical history  2010    echo EF 65%  . Hemorrhoid surgery    .  Hemicolectomy  2007    Dr.Matt Tsuei  . Carotid endarterectomy      Patient Active Problem List   Diagnosis Date Noted  . Carotid artery stenosis without cerebral infarction 08/20/2013  . Occlusion and stenosis of carotid artery without mention of cerebral infarction 07/16/2013  . Sinus bradycardia 06/19/2013  . Mitral regurgitation 06/18/2013  . Carotid artery disease without cerebral infarction 06/18/2013  . Hx of CABG 06/18/2013  . CKD (chronic kidney disease) stage 3, GFR 30-59 ml/min 06/18/2013  . Chronic diastolic CHF (congestive heart failure) 06/18/2013  . Statin intolerance 06/18/2013  . Cough 06/18/2013  . Testosterone deficiency 06/18/2013  . Colon cancer 06/18/2013  . PTSD (post-traumatic stress disorder) 06/18/2013  . Ejection fraction   . Diverticulosis   . Stroke   . LVH (left ventricular hypertrophy)   . HYPOTHYROIDISM 11/14/2008  . HYPERLIPIDEMIA-MIXED 11/14/2008  . HYPERTENSION, UNSPECIFIED 11/14/2008  . CAD 11/14/2008  . AORTIC VALVE SCLEROSIS 11/14/2008     ROS   Patient denies fever, chills, headache, sweats, rash, change in vision, change in hearing, chest pain, cough, nausea vomiting, urinary symptoms. All other systems are reviewed and are negative.  PHYSICAL EXAM  Patient is oriented to person time and place. Affect is normal. There is no jugulovenous distention. Head is atraumatic. Sclera and conjunctiva are normal. Lungs are clear. Respiratory effort is nonlabored. Cardiac exam reveals S1 and S2. Abdomen is soft. There is no peripheral edema. He is here with a family member.  Filed Vitals:   09/16/13 1040 09/16/13 1042  BP: 186/65 186/65  Pulse: 52   Height: 5\' 8"  (1.727 m)   Weight: 202 lb (91.627 kg)   SpO2: 93%      ASSESSMENT & PLAN

## 2013-09-16 NOTE — Assessment & Plan Note (Signed)
He has had a carotid stent placed and is receiving early followup today.

## 2013-09-16 NOTE — Assessment & Plan Note (Signed)
Coronary disease is stable. No change in therapy. 

## 2013-09-16 NOTE — Assessment & Plan Note (Signed)
His blood pressure was elevated. His meds have been adjusted. Pressure is high today but I am not inclined to change his meds at this point. This will be followed

## 2013-09-20 ENCOUNTER — Encounter: Payer: Self-pay | Admitting: Surgery

## 2013-09-23 ENCOUNTER — Encounter: Payer: Self-pay | Admitting: Surgery

## 2013-09-23 ENCOUNTER — Ambulatory Visit (INDEPENDENT_AMBULATORY_CARE_PROVIDER_SITE_OTHER): Payer: Medicare HMO | Admitting: Surgery

## 2013-09-23 VITALS — BP 156/55 | HR 50 | Ht 68.0 in | Wt 201.0 lb

## 2013-09-23 DIAGNOSIS — I6529 Occlusion and stenosis of unspecified carotid artery: Secondary | ICD-10-CM

## 2013-09-23 NOTE — Addendum Note (Signed)
Addended by: Mena Goes on: 09/23/2013 11:51 AM   Modules accepted: Orders

## 2013-09-23 NOTE — Progress Notes (Signed)
The patient is back today for followup.  He is status post left carotid stenting with distal embolic protection on 62/70/3500.  This was done for asymptomatic stenosis.  The patient has a history of left carotid endarterectomy in 1989 by Dr. Kellie Simmering.  This was done in the setting of neurologic compromise.  He has a known occlusion of his right carotid artery.  The patient reports no issues since his procedure.  He denies any neurologic symptoms.  On examination he remains neurologically intact.  His right groin cannulation site is okay.  I have reviewed his carotid ultrasound which shows a widely patent left carotid stent.  The patient will follow back up in 6 months with a repeat carotid ultrasound.

## 2013-11-22 ENCOUNTER — Other Ambulatory Visit (HOSPITAL_COMMUNITY): Payer: Self-pay | Admitting: Nephrology

## 2013-11-22 DIAGNOSIS — N183 Chronic kidney disease, stage 3 unspecified: Secondary | ICD-10-CM

## 2013-12-10 ENCOUNTER — Encounter: Payer: Self-pay | Admitting: Cardiology

## 2013-12-11 ENCOUNTER — Other Ambulatory Visit (HOSPITAL_COMMUNITY): Payer: Medicare HMO

## 2013-12-17 ENCOUNTER — Other Ambulatory Visit: Payer: Self-pay | Admitting: Surgery

## 2014-01-21 ENCOUNTER — Encounter: Payer: Self-pay | Admitting: Cardiology

## 2014-01-21 ENCOUNTER — Ambulatory Visit (INDEPENDENT_AMBULATORY_CARE_PROVIDER_SITE_OTHER): Payer: Medicare HMO | Admitting: Cardiology

## 2014-01-21 ENCOUNTER — Ambulatory Visit: Payer: Medicare HMO | Admitting: Cardiology

## 2014-01-21 VITALS — BP 188/74 | HR 48 | Ht 68.0 in | Wt 197.1 lb

## 2014-01-21 DIAGNOSIS — I25810 Atherosclerosis of coronary artery bypass graft(s) without angina pectoris: Secondary | ICD-10-CM

## 2014-01-21 DIAGNOSIS — I5032 Chronic diastolic (congestive) heart failure: Secondary | ICD-10-CM

## 2014-01-21 DIAGNOSIS — I10 Essential (primary) hypertension: Secondary | ICD-10-CM

## 2014-01-21 NOTE — Assessment & Plan Note (Signed)
His volume status is stable. No change in therapy. 

## 2014-01-21 NOTE — Assessment & Plan Note (Signed)
Coronary disease is stable. No change in therapy. 

## 2014-01-21 NOTE — Assessment & Plan Note (Signed)
I've carefully considered whether we should change his blood pressure meds. He is on several meds. He and his wife are hesitant. His wife is insistent that his blood pressure is very carefully followed at home. The majority of the time systolic is in the 185 range. This really is acceptable for this 80 year old patient. Therefore I chosen not to make any changes.

## 2014-01-21 NOTE — Progress Notes (Signed)
Patient ID: Darius Barber, male   DOB: 06/14/1931, 80 y.o.   MRN: [id number]    HPI Patient is seen today to follow-up coronary disease. I saw him last August, 2015. Earlier in the year it had become clear that he had severe carotid stenosis. He underwent carotid stenting. He had a post stenting Doppler done in August, 2015 and vascular surgery and it looked good. His blood pressure tends to run higher in the office that at home. His wife checks his blood pressure regularly at home. She says it is always in the 150s and sometimes as high as the 160s. His diastolic is always below 80. He has bradycardia at rest. He is stable.  Allergies  Allergen Reactions  . Simvastatin     REACTION: muscle aches    Current Outpatient Prescriptions  Medication Sig Dispense Refill  . ALPRAZolam (XANAX) 0.25 MG tablet Take 0.25 mg by mouth at bedtime as needed.    Marland Kitchen aspirin (ASPIRIN EC) 81 MG EC tablet Take 81 mg by mouth daily. Swallow whole.    Marland Kitchen atenolol (TENORMIN) 50 MG tablet Take 50 mg by mouth daily.    Marland Kitchen atorvastatin (LIPITOR) 80 MG tablet Take 80 mg by mouth daily.    . clopidogrel (PLAVIX) 75 MG tablet take 1 tablet by mouth once daily 30 tablet 6  . furosemide (LASIX) 40 MG tablet Take 40 mg by mouth. Take one tab every morning & 1/2 tab around 2 pm    . iron polysaccharides (NIFEREX) 150 MG capsule Take 150 mg by mouth daily.    Marland Kitchen levothyroxine (SYNTHROID, LEVOTHROID) 150 MCG tablet Take 150 mcg by mouth every morning.    Marland Kitchen losartan (COZAAR) 50 MG tablet Take 50 mg by mouth daily.    Marland Kitchen NIFEdipine (PROCARDIA XL/ADALAT-CC) 60 MG 24 hr tablet Take 60 mg by mouth daily.    . sertraline (ZOLOFT) 100 MG tablet Take 100 mg by mouth daily.    Marland Kitchen testosterone cypionate (DEPOTESTOTERONE CYPIONATE) 200 MG/ML injection Inject 100 mg into the muscle every 14 (fourteen) days.     . traZODone (DESYREL) 150 MG tablet Take 150 mg by mouth at bedtime.     No current facility-administered medications for this visit.     History   Social History  . Marital Status: Married    Spouse Name: N/A    Number of Children: N/A  . Years of Education: N/A   Occupational History  . Not on file.   Social History Main Topics  . Smoking status: Never Smoker   . Smokeless tobacco: Not on file  . Alcohol Use: No  . Drug Use: Not on file  . Sexual Activity: Not on file   Other Topics Concern  . Not on file   Social History Narrative    History reviewed. No pertinent family history.  Past Medical History  Diagnosis Date  . Hyperlipidemia   . Cancer     colorectal  . Hypertension   . Diverticulosis   . Coronary artery disease   . Carotid artery occlusion   . Thyroid disease     hypo  . PAD (peripheral artery disease)   . Renal insufficiency, mild   . Stroke   . LVH (left ventricular hypertrophy)     mild  . Ejection fraction     Past Surgical History  Procedure Laterality Date  . Coronary artery bypass graft  oct of 2006  . Other surgical history  2010    echo  EF 65%  . Hemorrhoid surgery    . Hemicolectomy  2007    Dr.Matt Tsuei  . Carotid endarterectomy      Patient Active Problem List   Diagnosis Date Noted  . Carotid artery stenosis without cerebral infarction 08/20/2013  . Occlusion and stenosis of carotid artery without mention of cerebral infarction 07/16/2013  . Sinus bradycardia 06/19/2013  . Mitral regurgitation 06/18/2013  . Carotid artery disease without cerebral infarction 06/18/2013  . Hx of CABG 06/18/2013  . CKD (chronic kidney disease) stage 3, GFR 30-59 ml/min 06/18/2013  . Chronic diastolic CHF (congestive heart failure) 06/18/2013  . Statin intolerance 06/18/2013  . Cough 06/18/2013  . Testosterone deficiency 06/18/2013  . Colon cancer 06/18/2013  . PTSD (post-traumatic stress disorder) 06/18/2013  . Ejection fraction   . Diverticulosis   . Stroke   . LVH (left ventricular hypertrophy)   . HYPOTHYROIDISM 11/14/2008  . HYPERLIPIDEMIA-MIXED 11/14/2008    . HYPERTENSION, UNSPECIFIED 11/14/2008  . CAD 11/14/2008  . AORTIC VALVE SCLEROSIS 11/14/2008    ROS  Patient denies fever, chills, headache, sweats, rash, change in vision, change in hearing, chest pain, cough, nausea or vomiting, urinary symptoms. All other systems are reviewed and are negative.  PHYSICAL EXAM Patient is stable. He is here with his wife. He is oriented to person time and place. Affect is normal. Head is atraumatic. Sclera and conjunctiva are normal. There is no jugular venous distention. Lungs are clear. Respiratory effort is not labored. Cardiac exam reveals S1 and S2. There is a soft systolic murmur. The abdomen is soft. There is no peripheral edema.  Filed Vitals:   01/21/14 1042  BP: 188/74  Pulse: 48  Height: 5\' 8"  (1.727 m)  Weight: 197 lb 1.9 oz (89.413 kg)  SpO2: 94%     ASSESSMENT & PLAN

## 2014-01-21 NOTE — Patient Instructions (Signed)

## 2014-01-23 ENCOUNTER — Encounter (HOSPITAL_COMMUNITY): Payer: Self-pay | Admitting: Surgery

## 2014-03-31 ENCOUNTER — Ambulatory Visit: Payer: Medicare HMO | Admitting: Surgery

## 2014-03-31 ENCOUNTER — Other Ambulatory Visit (HOSPITAL_COMMUNITY): Payer: Medicare HMO

## 2014-04-01 ENCOUNTER — Ambulatory Visit: Payer: Medicare HMO | Admitting: Vascular Surgery

## 2014-04-01 ENCOUNTER — Other Ambulatory Visit (HOSPITAL_COMMUNITY): Payer: Medicare HMO

## 2014-04-07 ENCOUNTER — Encounter: Payer: Self-pay | Admitting: Vascular Surgery

## 2014-04-08 ENCOUNTER — Ambulatory Visit (INDEPENDENT_AMBULATORY_CARE_PROVIDER_SITE_OTHER): Payer: Medicare HMO | Admitting: Vascular Surgery

## 2014-04-08 ENCOUNTER — Ambulatory Visit (HOSPITAL_COMMUNITY)
Admission: RE | Admit: 2014-04-08 | Discharge: 2014-04-08 | Disposition: A | Discharge status: Home / Self Care | Payer: Medicare HMO | Source: Ambulatory Visit | Attending: Vascular Surgery | Admitting: Vascular Surgery

## 2014-04-08 ENCOUNTER — Encounter: Payer: Self-pay | Admitting: Vascular Surgery

## 2014-04-08 VITALS — BP 149/78 | HR 47 | Resp 16 | Ht 68.5 in | Wt 192.0 lb

## 2014-04-08 DIAGNOSIS — I6523 Occlusion and stenosis of bilateral carotid arteries: Secondary | ICD-10-CM

## 2014-04-08 DIAGNOSIS — I6522 Occlusion and stenosis of left carotid artery: Secondary | ICD-10-CM | POA: Insufficient documentation

## 2014-04-08 NOTE — Progress Notes (Signed)
Subjective:     Patient ID: Darius Barber, male   DOB: [birth year], 81 y.o.   MRN: [id number]  HPI this 81 year old male returns for continued follow-up regarding his left carotid stent placed by Dr. Trula Slade in July 2015. Patient had a previous history of left carotid endarterectomy by me in 1989. Patient has a known right ICA occlusion. Patient has had no recent neurologic symptoms including lateralizing weakness, aphasia, amaurosis fugax, diplopia, blurred vision, or syncope. Patient does take aspirin and Plavix on a daily basis. Patient did have an episode a few months ago when he decided he would discontinue his testosterone injections which she had received twice monthly for several years. He developed some instability of gait and balance problems and when the testosterone injections were reinstituted the symptoms resolved.  Past Medical History  Diagnosis Date  . Hyperlipidemia   . Cancer     colorectal  . Hypertension   . Diverticulosis   . Coronary artery disease   . Carotid artery occlusion   . Thyroid disease     hypo  . PAD (peripheral artery disease)   . Renal insufficiency, mild   . Stroke   . LVH (left ventricular hypertrophy)     mild  . Ejection fraction     History  Substance Use Topics  . Smoking status: Never Smoker   . Smokeless tobacco: Not on file  . Alcohol Use: No    History reviewed. No pertinent family history.  Allergies  Allergen Reactions  . Simvastatin     REACTION: muscle aches     Current outpatient prescriptions:  .  ALPRAZolam (XANAX) 0.25 MG tablet, Take 0.25 mg by mouth at bedtime as needed., Disp: , Rfl:  .  aspirin (ASPIRIN EC) 81 MG EC tablet, Take 81 mg by mouth daily. Swallow whole., Disp: , Rfl:  .  atenolol (TENORMIN) 50 MG tablet, Take 50 mg by mouth daily., Disp: , Rfl:  .  atorvastatin (LIPITOR) 80 MG tablet, Take 80 mg by mouth daily., Disp: , Rfl:  .  clopidogrel (PLAVIX) 75 MG tablet, take 1 tablet by mouth once daily, Disp:  30 tablet, Rfl: 6 .  furosemide (LASIX) 40 MG tablet, Take 40 mg by mouth. Take one tab every morning & 1/2 tab around 2 pm, Disp: , Rfl:  .  iron polysaccharides (NIFEREX) 150 MG capsule, Take 150 mg by mouth daily., Disp: , Rfl:  .  levothyroxine (SYNTHROID, LEVOTHROID) 150 MCG tablet, Take 150 mcg by mouth every morning., Disp: , Rfl:  .  losartan (COZAAR) 50 MG tablet, Take 50 mg by mouth daily., Disp: , Rfl:  .  NIFEdipine (PROCARDIA XL/ADALAT-CC) 60 MG 24 hr tablet, Take 60 mg by mouth daily., Disp: , Rfl:  .  sertraline (ZOLOFT) 100 MG tablet, Take 100 mg by mouth daily., Disp: , Rfl:  .  testosterone cypionate (DEPOTESTOTERONE CYPIONATE) 200 MG/ML injection, Inject 100 mg into the muscle every 14 (fourteen) days. , Disp: , Rfl:  .  traZODone (DESYREL) 150 MG tablet, Take 150 mg by mouth at bedtime., Disp: , Rfl:   BP 149/78 mmHg  Pulse 47  Resp 16  Ht 5' 8.5" (1.74 m)  Wt 192 lb (87.091 kg)  BMI 28.77 kg/m2  Body mass index is 28.77 kg/(m^2).         Review of Systems denies chest pain does have a history of coronary artery disease with previous coronary artery bypass grafting in 2007.   patient reports bilateral calf  claudication symptoms but no rest pain or history of nonhealing ulcer. Patient has mild renal insufficiency and complains of occasional edema in lower extremities. Remote history of smoking but not in the last many years. Other systems negative and complete review of systems Objective:   Physical Exam BP 149/78 mmHg  Pulse 47  Resp 16  Ht 5' 8.5" (1.74 m)  Wt 192 lb (87.091 kg)  BMI 28.77 kg/m2  Gen.-alert and oriented x3 in no apparent distress HEENT normal for age Lungs no rhonchi or wheezing Cardiovascular regular rhythm no murmurs carotid pulses 3+ palpable no bruits audible Abdomen soft nontender no palpable masses Musculoskeletal free of  major deformities Skin clear -no rashes Neurologic normal Lower extremities 3+ femoral pulses bilaterally. No  distal pulses palpable bilaterally. Minimal edema bilaterally.  Today I ordered a carotid duplex exam which I reviewed and interpreted. The right ICA is chronically occluded. The left ICA has a widely patent carotid stent which has no evidence of stenosis or hyperplasia.      Assessment:     Widely patent left carotid stent placed 26 years post left carotid endarterectomy. Known right ICA occlusion Coronary artery disease status post coronary artery bypass grafting Peripheral arterial disease with stable bilateral claudication    Plan:     Patient will return in 6 months for follow-up carotid duplex exam to once again look at stent and evidence of restenosis If this is unremarkable will then go to annual carotid duplex follow-up Continue daily aspirin and Plavix therapy

## 2014-04-09 NOTE — Addendum Note (Signed)
Addended by: Mena Goes on: 04/09/2014 01:31 PM   Modules accepted: Orders

## 2014-04-10 NOTE — Addendum Note (Signed)
Addended by: Mena Goes on: 04/10/2014 09:41 AM   Modules accepted: Orders

## 2014-07-20 ENCOUNTER — Other Ambulatory Visit: Payer: Self-pay | Admitting: Surgery

## 2014-10-06 ENCOUNTER — Encounter: Payer: Self-pay | Admitting: Family

## 2014-10-07 ENCOUNTER — Ambulatory Visit: Payer: Medicare HMO | Admitting: Family

## 2014-10-07 ENCOUNTER — Encounter (HOSPITAL_COMMUNITY): Payer: Medicare HMO

## 2014-10-14 ENCOUNTER — Other Ambulatory Visit: Payer: Self-pay | Admitting: Vascular Surgery

## 2014-10-17 ENCOUNTER — Inpatient Hospital Stay (HOSPITAL_COMMUNITY): Admission: RE | Admit: 2014-10-17 | Payer: Medicare HMO | Source: Ambulatory Visit

## 2014-10-17 ENCOUNTER — Other Ambulatory Visit: Payer: Self-pay | Admitting: *Deleted

## 2014-10-17 DIAGNOSIS — I6523 Occlusion and stenosis of bilateral carotid arteries: Secondary | ICD-10-CM

## 2014-10-17 DIAGNOSIS — Z48812 Encounter for surgical aftercare following surgery on the circulatory system: Secondary | ICD-10-CM

## 2014-10-23 ENCOUNTER — Encounter: Payer: Self-pay | Admitting: Family

## 2014-10-24 ENCOUNTER — Ambulatory Visit (INDEPENDENT_AMBULATORY_CARE_PROVIDER_SITE_OTHER): Payer: Medicare HMO | Admitting: Family

## 2014-10-24 ENCOUNTER — Ambulatory Visit (HOSPITAL_COMMUNITY)
Admission: RE | Admit: 2014-10-24 | Discharge: 2014-10-24 | Disposition: A | Discharge status: Home / Self Care | Payer: Medicare HMO | Source: Ambulatory Visit | Attending: Vascular Surgery | Admitting: Vascular Surgery

## 2014-10-24 ENCOUNTER — Encounter: Payer: Self-pay | Admitting: Family

## 2014-10-24 VITALS — BP 184/71 | HR 52 | Temp 97.8°F | Resp 14 | Ht 69.0 in | Wt 202.0 lb

## 2014-10-24 DIAGNOSIS — I6521 Occlusion and stenosis of right carotid artery: Secondary | ICD-10-CM | POA: Diagnosis not present

## 2014-10-24 DIAGNOSIS — I6523 Occlusion and stenosis of bilateral carotid arteries: Secondary | ICD-10-CM | POA: Diagnosis not present

## 2014-10-24 DIAGNOSIS — R0989 Other specified symptoms and signs involving the circulatory and respiratory systems: Secondary | ICD-10-CM

## 2014-10-24 DIAGNOSIS — Z9889 Other specified postprocedural states: Secondary | ICD-10-CM

## 2014-10-24 DIAGNOSIS — Z48812 Encounter for surgical aftercare following surgery on the circulatory system: Secondary | ICD-10-CM

## 2014-10-24 DIAGNOSIS — Z959 Presence of cardiac and vascular implant and graft, unspecified: Secondary | ICD-10-CM

## 2014-10-24 NOTE — Progress Notes (Signed)
Established Carotid Patient   History of Present Illness  Darius Barber is a 81 y.o. male patient of Dr. Kellie Simmering who returns for continued follow-up regarding his left carotid stent placed by Dr. Trula Slade in July 2015. Patient had a previous history of left carotid endarterectomy by Dr. Kellie Simmering in 1989. Patient has a known right ICA occlusion.  He had a preoperative stroke in 1989 as manifested by syncope and left hemiparesis, has had no subsequent stroke or TIA. Patient does take aspirin and Plavix on a daily basis.  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   He reports that he has had tingling and numbness in right 4th and 5th fingers since he injured his right elbow long ago. He denies tingling or numbness or cold sensation in his left upper extremity.   The patient denies New Medical or Surgical History.  Wife states ot had testing of his legs years ago for circulation and states it was normal. Wife state pt has complained of right calf pain with walking about 2 miles, relieved by rest, since about 1997.  Pt Diabetic: no Pt smoker: former smoker, quit in the 1970's, smoked for about 10 years  Pt meds include: Statin : yes ASA: yes Other anticoagulants/antiplatelets: Plavix   Past Medical History  Diagnosis Date  . Hyperlipidemia   . Cancer     colorectal  . Hypertension   . Diverticulosis   . Coronary artery disease   . Carotid artery occlusion   . Thyroid disease     hypo  . PAD (peripheral artery disease)   . Renal insufficiency, mild   . Stroke   . LVH (left ventricular hypertrophy)     mild  . Ejection fraction     Social History Social History  Substance Use Topics  . Smoking status: Never Smoker   . Smokeless tobacco: Not on file  . Alcohol Use: No    Family History No family history on file.  Surgical History Past Surgical History  Procedure Laterality Date  . Coronary artery bypass graft  oct of 2006  . Other surgical  history  2010    echo EF 65%  . Hemorrhoid surgery    . Hemicolectomy  2007    Dr.Matt Tsuei  . Carotid endarterectomy    . Arch aortogram N/A 08/06/2013    Procedure: ARCH AORTOGRAM;  Surgeon: Serafina Mitchell, MD;  Location: [facility] CATH LAB;  Service: Cardiovascular;  Laterality: N/A;  . Carotid angiogram N/A 08/06/2013    Procedure: CAROTID ANGIOGRAM;  Surgeon: Serafina Mitchell, MD;  Location: [facility] CATH LAB;  Service: Cardiovascular;  Laterality: N/A;  . Carotid stent insertion Left 08/20/2013    Procedure: CAROTID STENT INSERTION;  Surgeon: Serafina Mitchell, MD;  Location: [facility] CATH LAB;  Service: Cardiovascular;  Laterality: Left;    Allergies  Allergen Reactions  . Simvastatin     REACTION: muscle aches    Current Outpatient Prescriptions  Medication Sig Dispense Refill  . ALPRAZolam (XANAX) 0.25 MG tablet Take 0.25 mg by mouth at bedtime as needed.    Marland Kitchen aspirin (ASPIRIN EC) 81 MG EC tablet Take 81 mg by mouth daily. Swallow whole.    Marland Kitchen atenolol (TENORMIN) 50 MG tablet Take 50 mg by mouth daily.    Marland Kitchen atorvastatin (LIPITOR) 80 MG tablet Take 80 mg by mouth daily.    . clopidogrel (PLAVIX) 75 MG tablet take 1 tablet by mouth once daily 30 tablet 6  .  furosemide (LASIX) 40 MG tablet Take 40 mg by mouth. Take one tab every morning & 1/2 tab around 2 pm    . iron polysaccharides (NIFEREX) 150 MG capsule Take 150 mg by mouth daily.    Marland Kitchen levothyroxine (SYNTHROID, LEVOTHROID) 150 MCG tablet Take 150 mcg by mouth every morning.    Marland Kitchen losartan (COZAAR) 50 MG tablet Take 50 mg by mouth daily.    Marland Kitchen NIFEdipine (PROCARDIA XL/ADALAT-CC) 60 MG 24 hr tablet Take 60 mg by mouth daily.    . sertraline (ZOLOFT) 100 MG tablet Take 100 mg by mouth daily.    Marland Kitchen testosterone cypionate (DEPOTESTOTERONE CYPIONATE) 200 MG/ML injection Inject 100 mg into the muscle every 14 (fourteen) days.     . traZODone (DESYREL) 150 MG tablet Take 150 mg by mouth at bedtime.     No current facility-administered medications for this  visit.    Review of Systems : See HPI for pertinent positives and negatives.  Physical Examination  Filed Vitals:   10/24/14 1325 10/24/14 1335 10/24/14 1336  BP: 127/69 191/69 184/71  Pulse: 52 52 52  Temp: 97.8 F (36.6 C)    Resp: 14    Height: 5\' 9"  (1.753 m)    Weight: 202 lb (91.627 kg)    SpO2: 97%     Body mass index is 29.82 kg/(m^2).  General: WDWN male in NAD GAIT: normal Eyes: PERRLA Pulmonary:  Non-labored, CTAB, no  Rales, no rhonchi, & no wheezing.  Cardiac: regular rhythm,  no detected murmur.  VASCULAR EXAM Carotid Bruits Right Left   Positive Negative    Aorta is not palpable. Radial pulses: 2+ right, absent in left, absent left ulnar pulse, 1+ left brachial pulse.                                                                                                                            LE Pulses Right Left       POPLITEAL  not palpable   not palpable       POSTERIOR TIBIAL  not palpable   not palpable        DORSALIS PEDIS      ANTERIOR TIBIAL not palpable  not palpable     Gastrointestinal: soft, nontender, BS WNL, no r/g,  no palpable masses.  Musculoskeletal: Negative muscle atrophy/wasting. M/S 5/5 throughout except 4/5 left LE extremity, extremities without ischemic changes. 1-2+ pitting edema in both lower legs with hemosiderin deposits.   Neurologic: A&O X 3; Appropriate Affect, Speech is normal CN 2-12 intact except left facial droop with smile and considerable hearing loss, pain and light touch intact in extremities, Motor exam as listed above.   Non-Invasive Vascular Imaging CAROTID DUPLEX 10/24/2014   CEREBROVASCULAR DUPLEX EVALUATION     INDICATION: Carotid artery stent    PREVIOUS INTERVENTION(S): Left carotid endarterectomy in 1989 with left carotid artery stent on 08/20/13    DUPLEX EXAM:     RIGHT  LEFT  Peak Systolic Velocities (cm/s) End Diastolic Velocities (cm/s) Plaque LOCATION Peak Systolic Velocities (cm/s) End  Diastolic Velocities (cm/s) Plaque  72 0  CCA PROXIMAL 117 21   61 0  CCA MID 95 12 HT  70 4 HT CCA DISTAL 94 21 HT  289 18 HT ECA 505 47 HT  No Flow  HT ICA PROXIMAL 117 24 HT  No Flow  HT ICA MID 79 15   No Flow  HT ICA DISTAL 76 13     Not Calculated ICA / CCA Ratio (PSV) 1.2  Antegrade Vertebral Flow Antegrade  174 Brachial Systolic Pressure (mmHg) 944  Multiphasic (subclavian artery) Brachial Artery Waveforms Multiphasic (subclavian artery)    Peak Systolic Velocities (cm/s) End Diastolic Velocities (cm/s) Plaque STENT (  ) Peak Systolic Velocities (cm/s) End Diastolic Velocities (cm/s) Plaque     PROXIMAL 117 24      MID 80 18      DISTAL 79 15     Plaque Morphology:  HM = Homogeneous, HT = Heterogeneous, CP = Calcific Plaque, SP = Smooth Plaque, IP = Irregular Plaque    ADDITIONAL FINDINGS: Velocity of 328cm/s noted in the left proximal subclavian artery.    IMPRESSION: Doppler velocities noted above. An interpreting physician was not available to review study findings or images the day of the patient's nurse practitioner office visit. The final report is expected to be available on 10/27/14.     Compared to the previous exam:  Previous carotid exam performed on 04/08/14.      Assessment: Darius Barber is a 81 y.o. male who is s/p left carotid stent placed by Dr. Trula Slade in July 2015. Patient had a previous history of left carotid endarterectomy by Dr. Kellie Simmering in 1989. Patient has a known right ICA occlusion.  He had a preoperative stroke in 1989 as manifested by syncope and left hemiparesis, has had no subsequent stroke or TIA. Patient does take aspirin and Plavix on a daily basis.   Today's carotid Duplex suggests the right internal carotid artery remains occluded and the left ICA with stent in place appears to remain widely patent with no evidence of restenosis. No significant change from 04/08/14.  Venous insufficiency: 1-2+ pitting edema in both lower legs with  hemosiderin deposits, edema resolves overnight by reported history; he has knee high compression hose which he does not wear and wife reports that pt keeps legs dependent during the day.   Decreased pedal pulses: will check ABI's on return. He walks a couple of miles before he has left calf pain which is relieved by rest.  Face to face time with patient was 25 minutes. Over 50% of this time was spent on counseling and coordination of care.   Plan:  Continue extensive walking.  Proper elevation of feet and legs demonstrated and discussed with pt and wife. Wife will try to obtain another pair of graduated knee high compression hose that may be more comfortable for pt.  Follow-up in 1 year with Carotid Duplex scan and ABI's.   I discussed in depth with the patient the nature of atherosclerosis, and emphasized the importance of maximal medical management including strict control of blood pressure, blood glucose, and lipid levels, obtaining regular exercise, and continued cessation of smoking.  The patient is aware that without maximal medical management the underlying atherosclerotic disease process will progress, limiting the benefit of any interventions. The patient was given information about stroke prevention and what symptoms should prompt the patient to  seek immediate medical care. Thank you for allowing [place] to participate in this patient's care.  Clemon Chambers, RN, MSN, FNP-C [facility] Office: [phone]  Clinic Physician: Bridgett Larsson  10/24/2014 1:29 PM

## 2014-10-24 NOTE — Patient Instructions (Signed)
Stroke Prevention Some medical conditions and behaviors are associated with an increased chance of having a stroke. You may prevent a stroke by making healthy choices and managing medical conditions. HOW CAN I REDUCE MY RISK OF HAVING A STROKE?   Stay physically active. Get at least 30 minutes of activity on most or all days.  Do not smoke. It may also be helpful to avoid exposure to secondhand smoke.  Limit alcohol use. Moderate alcohol use is considered to be:  No more than 2 drinks per day for men.  No more than 1 drink per day for nonpregnant women.  Eat healthy foods. This involves:  Eating 5 or more servings of fruits and vegetables a day.  Making dietary changes that address high blood pressure (hypertension), high cholesterol, diabetes, or obesity.  Manage your cholesterol levels.  Making food choices that are high in fiber and low in saturated fat, trans fat, and cholesterol may control cholesterol levels.  Take any prescribed medicines to control cholesterol as directed by your health care provider.  Manage your diabetes.  Controlling your carbohydrate and sugar intake is recommended to manage diabetes.  Take any prescribed medicines to control diabetes as directed by your health care provider.  Control your hypertension.  Making food choices that are low in salt (sodium), saturated fat, trans fat, and cholesterol is recommended to manage hypertension.  Take any prescribed medicines to control hypertension as directed by your health care provider.  Maintain a healthy weight.  Reducing calorie intake and making food choices that are low in sodium, saturated fat, trans fat, and cholesterol are recommended to manage weight.  Stop drug abuse.  Avoid taking birth control pills.  Talk to your health care provider about the risks of taking birth control pills if you are over 35 years old, smoke, get migraines, or have ever had a blood clot.  Get evaluated for sleep  disorders (sleep apnea).  Talk to your health care provider about getting a sleep evaluation if you snore a lot or have excessive sleepiness.  Take medicines only as directed by your health care provider.  For some people, aspirin or blood thinners (anticoagulants) are helpful in reducing the risk of forming abnormal blood clots that can lead to stroke. If you have the irregular heart rhythm of atrial fibrillation, you should be on a blood thinner unless there is a good reason you cannot take them.  Understand all your medicine instructions.  Make sure that other conditions (such as anemia or atherosclerosis) are addressed. SEEK IMMEDIATE MEDICAL CARE IF:   You have sudden weakness or numbness of the face, arm, or leg, especially on one side of the body.  Your face or eyelid droops to one side.  You have sudden confusion.  You have trouble speaking (aphasia) or understanding.  You have sudden trouble seeing in one or both eyes.  You have sudden trouble walking.  You have dizziness.  You have a loss of balance or coordination.  You have a sudden, severe headache with no known cause.  You have new chest pain or an irregular heartbeat. Any of these symptoms may represent a serious problem that is an emergency. Do not wait to see if the symptoms will go away. Get medical help at once. Call your local emergency services (911 in [place] Do not drive yourself to the hospital. Document Released: 03/10/2004 Document Revised: 06/17/2013 Document Reviewed: 08/03/2012 Exit[employer] Patient Information 2015 [employer]. This information is not intended to replace advice given   to you by your health care provider. Make sure you discuss any questions you have with your health care provider.   Venous Stasis or Chronic Venous Insufficiency Chronic venous insufficiency, also called venous stasis, is a condition that affects the veins in the legs. The condition prevents blood from being pumped  through these veins effectively. Blood may no longer be pumped effectively from the legs back to the heart. This condition can range from mild to severe. With proper treatment, you should be able to continue with an active life. CAUSES  Chronic venous insufficiency occurs when the vein walls become stretched, weakened, or damaged or when valves within the vein are damaged. Some common causes of this include:  High blood pressure inside the veins (venous hypertension).  Increased blood pressure in the leg veins from long periods of sitting or standing.  A blood clot that blocks blood flow in a vein (deep vein thrombosis).  Inflammation of a superficial vein (phlebitis) that causes a blood clot to form. RISK FACTORS Various things can make you more likely to develop chronic venous insufficiency, including:  Family history of this condition.  Obesity.  Pregnancy.  Sedentary lifestyle.  Smoking.  Jobs requiring long periods of standing or sitting in one place.  Being a certain age. Women in their 40s and 50s and men in their 70s are more likely to develop this condition. SIGNS AND SYMPTOMS  Symptoms may include:   Varicose veins.  Skin breakdown or ulcers.  Reddened or discolored skin on the leg.  Brown, smooth, tight, and painful skin just above the ankle, usually on the inside surface (lipodermatosclerosis).  Swelling. DIAGNOSIS  To diagnose this condition, your health care provider will take a medical history and do a physical exam. The following tests may be ordered to confirm the diagnosis:  Duplex ultrasound--A procedure that produces a picture of a blood vessel and nearby organs and also provides information on blood flow through the blood vessel.  Plethysmography--A procedure that tests blood flow.  A venogram, or venography--A procedure used to look at the veins using X-ray and dye. TREATMENT The goals of treatment are to help you return to an active life and to  minimize pain or disability. Treatment will depend on the severity of the condition. Medical procedures may be needed for severe cases. Treatment options may include:   Use of compression stockings. These can help with symptoms and lower the chances of the problem getting worse, but they do not cure the problem.  Sclerotherapy--A procedure involving an injection of a material that "dissolves" the damaged veins. Other veins in the network of blood vessels take over the function of the damaged veins.  Surgery to remove the vein or cut off blood flow through the vein (vein stripping or laser ablation surgery).  Surgery to repair a valve. HOME CARE INSTRUCTIONS   Wear compression stockings as directed by your health care provider.  Only take over-the-counter or prescription medicines for pain, discomfort, or fever as directed by your health care provider.  Follow up with your health care provider as directed. SEEK MEDICAL CARE IF:   You have redness, swelling, or increasing pain in the affected area.  You see a red streak or line that extends up or down from the affected area.  You have a breakdown or loss of skin in the affected area, even if the breakdown is small.  You have an injury to the affected area. SEEK IMMEDIATE MEDICAL CARE IF:   You have   an injury and open wound in the affected area.  Your pain is severe and does not improve with medicine.  You have sudden numbness or weakness in the foot or ankle below the affected area, or you have trouble moving your foot or ankle.  You have a fever or persistent symptoms for more than 2-3 days.  You have a fever and your symptoms suddenly get worse. MAKE SURE YOU:   Understand these instructions.  Will watch your condition.  Will get help right away if you are not doing well or get worse. Document Released: 06/06/2006 Document Revised: 11/21/2012 Document Reviewed: 10/08/2012 ExitCare Patient Information 2015 [employer].  This information is not intended to replace advice given to you by your health care provider. Make sure you discuss any questions you have with your health care provider.  

## 2014-10-24 NOTE — Progress Notes (Signed)
Filed Vitals:   10/24/14 1325 10/24/14 1335 10/24/14 1336  BP: 127/69 191/69 184/71  Pulse: 52 52 52  Temp: 97.8 F (36.6 C)    Resp: 14    Height: 5\' 9"  (1.753 m)    Weight: 202 lb (91.627 kg)    SpO2: 97%

## 2014-10-28 ENCOUNTER — Encounter: Payer: Self-pay | Admitting: Family Medicine

## 2014-11-12 ENCOUNTER — Ambulatory Visit (INDEPENDENT_AMBULATORY_CARE_PROVIDER_SITE_OTHER): Payer: Medicare HMO | Admitting: Cardiology

## 2014-11-12 ENCOUNTER — Encounter: Payer: Self-pay | Admitting: Cardiology

## 2014-11-12 VITALS — BP 118/66 | HR 50 | Ht 68.0 in | Wt 199.4 lb

## 2014-11-12 DIAGNOSIS — I5032 Chronic diastolic (congestive) heart failure: Secondary | ICD-10-CM

## 2014-11-12 DIAGNOSIS — I25810 Atherosclerosis of coronary artery bypass graft(s) without angina pectoris: Secondary | ICD-10-CM

## 2014-11-12 DIAGNOSIS — R001 Bradycardia, unspecified: Secondary | ICD-10-CM

## 2014-11-12 DIAGNOSIS — R9430 Abnormal result of cardiovascular function study, unspecified: Secondary | ICD-10-CM

## 2014-11-12 DIAGNOSIS — R0989 Other specified symptoms and signs involving the circulatory and respiratory systems: Secondary | ICD-10-CM

## 2014-11-12 DIAGNOSIS — Z951 Presence of aortocoronary bypass graft: Secondary | ICD-10-CM

## 2014-11-12 MED ORDER — ATENOLOL 25 MG PO TABS: 25.0000 mg | ORAL_TABLET | Freq: Every day | ORAL | Status: AC

## 2014-11-12 NOTE — Assessment & Plan Note (Signed)
The patient underwent CABG in 2006. Nuclear study was done in 2010. EF was normal and there was no scheme you. He is feeling well. He is on aspirin and appropriate statin. I've chosen not to do exercise testing. No further workup.

## 2014-11-12 NOTE — Assessment & Plan Note (Signed)
The patient has sinus bradycardia. It appears to be asymptomatic. He is on a beta blocker. I will try to wean him off this to see how he does. I will decrease his atenolol from 50 mg to 25 mg daily. He'll be seen back in the office to be sure he is stable without return of chest pain. Then consideration can be given to possibly stopping his beta blocker if it appears to be necessary for bradycardia.

## 2014-11-12 NOTE — Assessment & Plan Note (Signed)
Patient has mild chronic diastolic CHF. His volume status is controlled. No further workup.

## 2014-11-12 NOTE — Progress Notes (Signed)
Cardiology Office Note   Date:  11/12/2014   ID:  Darius Barber, DOB 12-May-1931, MRN [id number]  PCP:  Curlene Labrum, MD  Cardiologist:  Dola Argyle, MD   Chief Complaint  Patient presents with  . Appointment    Follow-up coronary disease      History of Present Illness: Darius Barber is a 81 y.o. male who presents to follow-up coronary disease. He had severe carotid stenosis in August, 2015. He had carotid stenting. He had follow-up with vascular surgery earlier this month and he is quite stable. We have also followed his blood pressure over time. He's feeling well. He is not having any chest pain.    Past Medical History  Diagnosis Date  . Hyperlipidemia   . Cancer     colorectal  . Hypertension   . Diverticulosis   . Coronary artery disease   . Carotid artery occlusion   . Thyroid disease     hypo  . PAD (peripheral artery disease)   . Renal insufficiency, mild   . Stroke   . LVH (left ventricular hypertrophy)     mild  . Ejection fraction     Past Surgical History  Procedure Laterality Date  . Coronary artery bypass graft  oct of 2006  . Other surgical history  2010    echo EF 65%  . Hemorrhoid surgery    . Hemicolectomy  2007    Dr.Matt Tsuei  . Carotid endarterectomy    . Arch aortogram N/A 08/06/2013    Procedure: ARCH AORTOGRAM;  Surgeon: Serafina Mitchell, MD;  Location: [facility] CATH LAB;  Service: Cardiovascular;  Laterality: N/A;  . Carotid angiogram N/A 08/06/2013    Procedure: CAROTID ANGIOGRAM;  Surgeon: Serafina Mitchell, MD;  Location: [facility] CATH LAB;  Service: Cardiovascular;  Laterality: N/A;  . Carotid stent insertion Left 08/20/2013    Procedure: CAROTID STENT INSERTION;  Surgeon: Serafina Mitchell, MD;  Location: [facility] CATH LAB;  Service: Cardiovascular;  Laterality: Left;    Patient Active Problem List   Diagnosis Date Noted  . Carotid stenosis, asymptomatic 04/08/2014  . Carotid artery stenosis without cerebral infarction 08/20/2013  . Occlusion  and stenosis of carotid artery without mention of cerebral infarction 07/16/2013  . Sinus bradycardia 06/19/2013  . Mitral regurgitation 06/18/2013  . Carotid artery disease without cerebral infarction 06/18/2013  . Hx of CABG 06/18/2013  . CKD (chronic kidney disease) stage 3, GFR 30-59 ml/min 06/18/2013  . Chronic diastolic CHF (congestive heart failure) 06/18/2013  . Statin intolerance 06/18/2013  . Cough 06/18/2013  . Testosterone deficiency 06/18/2013  . Colon cancer 06/18/2013  . PTSD (post-traumatic stress disorder) 06/18/2013  . Ejection fraction   . Diverticulosis   . Stroke   . LVH (left ventricular hypertrophy)   . HYPOTHYROIDISM 11/14/2008  . HYPERLIPIDEMIA-MIXED 11/14/2008  . Essential hypertension 11/14/2008  . Coronary atherosclerosis 11/14/2008  . AORTIC VALVE SCLEROSIS 11/14/2008      Current Outpatient Prescriptions  Medication Sig Dispense Refill  . ALPRAZolam (XANAX) 0.25 MG tablet Take 0.25 mg by mouth at bedtime as needed.    Marland Kitchen aspirin (ASPIRIN EC) 81 MG EC tablet Take 81 mg by mouth daily. Swallow whole.    Marland Kitchen atorvastatin (LIPITOR) 80 MG tablet Take 80 mg by mouth daily.    . clopidogrel (PLAVIX) 75 MG tablet take 1 tablet by mouth once daily 30 tablet 6  . furosemide (LASIX) 40 MG tablet Take 40 mg by mouth. Take one tab every  morning & 1/2 tab around 2 pm    . iron polysaccharides (NIFEREX) 150 MG capsule Take 150 mg by mouth daily.    Marland Kitchen levothyroxine (SYNTHROID, LEVOTHROID) 150 MCG tablet Take 150 mcg by mouth every morning.    Marland Kitchen losartan (COZAAR) 50 MG tablet Take 50 mg by mouth daily.    Marland Kitchen NIFEdipine (PROCARDIA XL/ADALAT-CC) 60 MG 24 hr tablet Take 60 mg by mouth daily.    . sertraline (ZOLOFT) 100 MG tablet Take 100 mg by mouth daily.    Marland Kitchen testosterone cypionate (DEPOTESTOTERONE CYPIONATE) 200 MG/ML injection Inject 100 mg into the muscle every 14 (fourteen) days.     . traZODone (DESYREL) 150 MG tablet Take 150 mg by mouth at bedtime.    Marland Kitchen atenolol  (TENORMIN) 25 MG tablet Take 1 tablet (25 mg total) by mouth daily. 90 tablet 3   No current facility-administered medications for this visit.    Allergies:   Simvastatin    Social History:  The patient  reports that he quit smoking about 35 years ago. He has never used smokeless tobacco. He reports that he does not drink alcohol or use illicit drugs.   Family History:  The patient's family history is not on file.    ROS:  Please see the history of present illness.     Patient denies fever, chills, headache, sweats, rash, change in vision, change in hearing, chest pain, cough, nausea or vomiting, urinary symptoms. All other systems are reviewed and are negative.   PHYSICAL EXAM: VS:  BP 118/66 mmHg  Pulse 50  Ht 5\' 8"  (1.727 m)  Wt 199 lb 6.4 oz (90.447 kg)  BMI 30.33 kg/m2  SpO2 91% , Patient is oriented to person time and place. Affect is normal. He is here with his wife. Head is atraumatic. Sclera and conjunctiva are normal. There is no jugulovenous distention. Lungs are clear. Respiratory effort is not labored. Cardiac exam reveals S1 and S2. Abdomen is soft. There is no peripheral edema. There are no musculoskeletal deformities. There are no skin rashes.   EKG:   EKG is done today and reviewed by me. There is increased voltage and left axis. There is marked sinus bradycardia with a rate of 59.  Recent Labs: No results found for requested labs within last 365 days.    Lipid Panel    Component Value Date/Time   CHOL 135 11/19/2008 0000   TRIG 79.0 11/19/2008 0000   HDL 35.50* 11/19/2008 0000   CHOLHDL 4 11/19/2008 0000   VLDL 15.8 11/19/2008 0000   LDLCALC 84 11/19/2008 0000      Wt Readings from Last 3 Encounters:  11/12/14 199 lb 6.4 oz (90.447 kg)  10/24/14 202 lb (91.627 kg)  04/08/14 192 lb (87.091 kg)      Current medicines are reviewed  The patient understands his medications.     ASSESSMENT AND PLAN:

## 2014-11-12 NOTE — Assessment & Plan Note (Signed)
His EF was 65-70% by echo in March, 2015. No further workup.

## 2014-11-12 NOTE — Patient Instructions (Signed)
Your physician has recommended you make the following change in your medication:  Decrease your atenolol to 25 mg daily. Continue all other medications the same. Your physician recommends that you schedule a follow-up appointment in: 4 weeks.

## 2014-12-09 ENCOUNTER — Encounter: Payer: Self-pay | Admitting: Cardiology

## 2014-12-09 ENCOUNTER — Ambulatory Visit (INDEPENDENT_AMBULATORY_CARE_PROVIDER_SITE_OTHER): Payer: Medicare HMO | Admitting: Cardiology

## 2014-12-09 VITALS — BP 153/55 | HR 56 | Ht 68.0 in | Wt 200.0 lb

## 2014-12-09 DIAGNOSIS — I2510 Atherosclerotic heart disease of native coronary artery without angina pectoris: Secondary | ICD-10-CM

## 2014-12-09 DIAGNOSIS — I6523 Occlusion and stenosis of bilateral carotid arteries: Secondary | ICD-10-CM | POA: Diagnosis not present

## 2014-12-09 DIAGNOSIS — E785 Hyperlipidemia, unspecified: Secondary | ICD-10-CM | POA: Diagnosis not present

## 2014-12-09 DIAGNOSIS — R001 Bradycardia, unspecified: Secondary | ICD-10-CM | POA: Diagnosis not present

## 2014-12-09 NOTE — Patient Instructions (Signed)
Your physician recommends that you continue on your current medications as directed. Please refer to the Current Medication list given to you today. Your physician recommends that you schedule a follow-up appointment in: 6 months. You will receive a reminder letter in the mail in about 4 months reminding you to call and schedule your appointment. If you don't receive this letter, please contact our office. 

## 2014-12-09 NOTE — Progress Notes (Signed)
Cardiology Office Note  Date: 12/09/2014   ID: Darius Barber, DOB 1932/01/02, MRN [id number]  PCP: Curlene Labrum, MD  Primary Cardiologist: Rozann Lesches, MD   Chief Complaint  Patient presents with  . Coronary Artery Disease  . Follow-up bradycardia    History of Present Illness: Darius Barber is a 81 y.o. male former patient Dr. Ron Parker, recently seen in September. He is now establishing with me, this is our first meeting in clinic. I reviewed the chart. At his last visit, he was noted to be bradycardic resulting in a decrease in atenolol from 50 mg daily to 25 mg daily.  He is here with his wife today for a follow-up visit. He does not notice any significant change in overall stamina or symptomatology.  No dizziness or syncope. He does not report any angina at baseline. Still tries to stay active, gets outside to do chores, rests when he has to.   His last ischemic workup was in 2010 based on Dr. Ron Parker note. We discussed the possibility of considering follow-up ischemic evaluation, although he prefers to continue observation for now unless he notices any significant decline.  He did have an echocardiogram last year which showed normal LVEF and grade 2 diastolic dysfunction. Weight has been stable. He reports NYHA class II dyspnea.  He has a significant history of carotid artery disease as detailed below, continues to follow with [facility]. Recent carotid Dopplers are reviewed.   Past Medical History  Diagnosis Date  . Hyperlipidemia   . Colon cancer ([place])   . Essential hypertension   . Diverticulosis   . Coronary artery disease     Multivessel status post CABG 2006   . Carotid artery occlusion     Occluded RICA, remote left CEA, left carotid stent July 2015 - Dr. Trula Slade  . Hypothyroidism   . PAD (peripheral artery disease) ([place])   . Renal insufficiency, mild   . History of stroke     Past Surgical History  Procedure Laterality Date  . Coronary artery bypass graft  Oct  2006    [facility] - LIMA to LAD, SVG to D1, SVG to OM1, SVG to PDA  . Hemorrhoid surgery    . Hemicolectomy  2007    Dr.Matt Tsuei  . Carotid endarterectomy    . Arch aortogram N/A 08/06/2013    Procedure: ARCH AORTOGRAM;  Surgeon: Serafina Mitchell, MD;  Location[facility] CATH LAB;  Service: Cardiovascular;  Laterality: N/A;  . Carotid angiogram N/A 08/06/2013    Procedure: CAROTID ANGIOGRAM;  Surgeon: Serafina Mitchell, MD;  Location: [facility] CATH LAB;  Service: Cardiovascular;  Laterality: N/A;  . Carotid stent insertion Left 08/20/2013    Procedure: CAROTID STENT INSERTION;  Surgeon: Serafina Mitchell, MD;  Location: [facility] CATH LAB;  Service: Cardiovascular;  Laterality: Left;    Current Outpatient Prescriptions  Medication Sig Dispense Refill  . ALPRAZolam (XANAX) 0.25 MG tablet Take 0.25 mg by mouth at bedtime as needed.    Marland Kitchen aspirin (ASPIRIN EC) 81 MG EC tablet Take 81 mg by mouth daily. Swallow whole.    Marland Kitchen atenolol (TENORMIN) 25 MG tablet Take 1 tablet (25 mg total) by mouth daily. 90 tablet 3  . atorvastatin (LIPITOR) 80 MG tablet Take 80 mg by mouth daily.    . clopidogrel (PLAVIX) 75 MG tablet take 1 tablet by mouth once daily 30 tablet 6  . furosemide (LASIX) 40 MG tablet Take 40 mg by mouth. Take one tab every  morning & 1/2 tab around 2 pm    . iron polysaccharides (NIFEREX) 150 MG capsule Take 150 mg by mouth daily.    Marland Kitchen levothyroxine (SYNTHROID, LEVOTHROID) 150 MCG tablet Take 150 mcg by mouth every morning.    Marland Kitchen losartan (COZAAR) 50 MG tablet Take 50 mg by mouth daily.    Marland Kitchen NIFEdipine (PROCARDIA XL/ADALAT-CC) 60 MG 24 hr tablet Take 60 mg by mouth daily.    . sertraline (ZOLOFT) 100 MG tablet Take 100 mg by mouth daily.    Marland Kitchen testosterone cypionate (DEPOTESTOTERONE CYPIONATE) 200 MG/ML injection Inject 100 mg into the muscle every 14 (fourteen) days.     . traZODone (DESYREL) 150 MG tablet Take 150 mg by mouth at bedtime.     No current facility-administered medications for this visit.     Allergies:  Simvastatin   Social History: The patient  reports that he quit smoking about 35 years ago. His smoking use included Cigarettes. He has never used smokeless tobacco. He reports that he does not drink alcohol or use illicit drugs.   ROS:  Please see the history of present illness. Otherwise, complete review of systems is positive for decreased hearing, arthritic stiffness.  All other systems are reviewed and negative.   Physical Exam: VS:  BP 153/55 mmHg  Pulse 56  Ht 5\' 8"  (1.727 m)  Wt 200 lb (90.719 kg)  BMI 30.42 kg/m2  SpO2 92%, BMI Body mass index is 30.42 kg/(m^2).  Wt Readings from Last 3 Encounters:  12/09/14 200 lb (90.719 kg)  11/12/14 199 lb 6.4 oz (90.447 kg)  10/24/14 202 lb (91.627 kg)     General:  Elderly male, appears comfortable at rest. HEENT: Conjunctiva and lids normal, oropharynx clear. Neck: Supple, no elevated JVP, left carotid bruit, no thyromegaly. Lungs: Clear to auscultation, nonlabored breathing at rest. Cardiac: Regular rate and rhythm, no S3,  soft systolic murmur, no pericardial rub. Abdomen: Soft, nontender, bowel sounds present. Extremities:  Mild leg edema, left worse than right, distal pulses 2+. Skin: Warm and dry. Musculoskeletal: No kyphosis. Neuropsychiatric: Alert and oriented x3, affect grossly appropriate.   ECG: Recent tracing from 11/12/2014 showed sinus bradycardia at 49 bpm with left anterior fascicular block and LVH.   Recent Labwork: No results found for requested labs within last 365 days.     Component Value Date/Time   CHOL 135 11/19/2008 0000   TRIG 79.0 11/19/2008 0000   HDL 35.50* 11/19/2008 0000   CHOLHDL 4 11/19/2008 0000   VLDL 15.8 11/19/2008 0000   LDLCALC 84 11/19/2008 0000    Other Studies Reviewed Today:  Echocardiogram 04/25/2013: Study Conclusions  - Left ventricle: The cavity size was normal. Wall thickness was increased in a pattern of severe LVH. Systolic function was  vigorous. The estimated ejection fraction was in the range of 65% to 70%. Features are consistent with a pseudonormal left ventricular filling pattern, with concomitant abnormal relaxation and increased filling pressure (grade 2 diastolic dysfunction). - Mitral valve: Mild regurgitation.   Carotid Dopplers 02/16/2438 revealed 1-02% LICA stenosis with occluded RICA as before.   Assessment and Plan:  1. Asymptomatic sinus bradycardia based on discussion today. No change in current regimen, keep atenolol 25 mg daily for now.  2. Multivessel CAD status post CABG. No active angina symptoms. Last ischemic evaluation was in 2010. He prefers observation at this point.  3. Bilateral carotid artery disease as outlined above. He continues to follow with [facility]. Has occluded RCA, LICA status post CEA  and stent placement.  4.  Hyperlipidemia on Lipitor.  Current medicines were reviewed with the patient today.  Disposition: FU with me in 6 months.   Signed, Satira Sark, MD, [facility] 12/09/2014 3:56 PM    [facility] [place], [place], [place] [address] Phone: [phone]; Fax: [phone]

## 2014-12-21 ENCOUNTER — Inpatient Hospital Stay (HOSPITAL_COMMUNITY): Payer: Medicare HMO

## 2014-12-21 ENCOUNTER — Inpatient Hospital Stay (HOSPITAL_COMMUNITY): Payer: Medicare HMO | Admitting: Anesthesiology

## 2014-12-21 ENCOUNTER — Inpatient Hospital Stay (HOSPITAL_COMMUNITY)
Admission: RE | Admit: 2014-12-21 | Discharge: 2015-01-15 | DRG: 004 | Disposition: A | Discharge status: Other Acute Inpatient Hospital | Payer: Medicare HMO | Source: Other Acute Inpatient Hospital | Attending: Internal Medicine | Admitting: Internal Medicine

## 2014-12-21 ENCOUNTER — Encounter (HOSPITAL_COMMUNITY)
Admission: RE | Disposition: A | Discharge status: Other Acute Inpatient Hospital | Payer: Self-pay | Source: Other Acute Inpatient Hospital | Attending: Internal Medicine

## 2014-12-21 DIAGNOSIS — T45515A Adverse effect of anticoagulants, initial encounter: Secondary | ICD-10-CM | POA: Diagnosis not present

## 2014-12-21 DIAGNOSIS — I6340 Cerebral infarction due to embolism of unspecified cerebral artery: Principal | ICD-10-CM | POA: Diagnosis present

## 2014-12-21 DIAGNOSIS — J9501 Hemorrhage from tracheostomy stoma: Secondary | ICD-10-CM | POA: Diagnosis not present

## 2014-12-21 DIAGNOSIS — Z0189 Encounter for other specified special examinations: Secondary | ICD-10-CM

## 2014-12-21 DIAGNOSIS — R001 Bradycardia, unspecified: Secondary | ICD-10-CM | POA: Diagnosis present

## 2014-12-21 DIAGNOSIS — I639 Cerebral infarction, unspecified: Secondary | ICD-10-CM

## 2014-12-21 DIAGNOSIS — I69320 Aphasia following cerebral infarction: Secondary | ICD-10-CM

## 2014-12-21 DIAGNOSIS — Z85038 Personal history of other malignant neoplasm of large intestine: Secondary | ICD-10-CM | POA: Diagnosis not present

## 2014-12-21 DIAGNOSIS — I129 Hypertensive chronic kidney disease with stage 1 through stage 4 chronic kidney disease, or unspecified chronic kidney disease: Secondary | ICD-10-CM | POA: Diagnosis present

## 2014-12-21 DIAGNOSIS — I493 Ventricular premature depolarization: Secondary | ICD-10-CM | POA: Diagnosis not present

## 2014-12-21 DIAGNOSIS — D696 Thrombocytopenia, unspecified: Secondary | ICD-10-CM | POA: Diagnosis present

## 2014-12-21 DIAGNOSIS — Z930 Tracheostomy status: Secondary | ICD-10-CM

## 2014-12-21 DIAGNOSIS — E785 Hyperlipidemia, unspecified: Secondary | ICD-10-CM | POA: Diagnosis present

## 2014-12-21 DIAGNOSIS — I63231 Cerebral infarction due to unspecified occlusion or stenosis of right carotid arteries: Secondary | ICD-10-CM | POA: Insufficient documentation

## 2014-12-21 DIAGNOSIS — Z9189 Other specified personal risk factors, not elsewhere classified: Secondary | ICD-10-CM

## 2014-12-21 DIAGNOSIS — Z951 Presence of aortocoronary bypass graft: Secondary | ICD-10-CM | POA: Diagnosis not present

## 2014-12-21 DIAGNOSIS — I25709 Atherosclerosis of coronary artery bypass graft(s), unspecified, with unspecified angina pectoris: Secondary | ICD-10-CM | POA: Insufficient documentation

## 2014-12-21 DIAGNOSIS — S3981XA Other specified injuries of abdomen, initial encounter: Secondary | ICD-10-CM

## 2014-12-21 DIAGNOSIS — I509 Heart failure, unspecified: Secondary | ICD-10-CM

## 2014-12-21 DIAGNOSIS — J9601 Acute respiratory failure with hypoxia: Secondary | ICD-10-CM | POA: Diagnosis not present

## 2014-12-21 DIAGNOSIS — J690 Pneumonitis due to inhalation of food and vomit: Secondary | ICD-10-CM

## 2014-12-21 DIAGNOSIS — I25119 Atherosclerotic heart disease of native coronary artery with unspecified angina pectoris: Secondary | ICD-10-CM | POA: Diagnosis not present

## 2014-12-21 DIAGNOSIS — G919 Hydrocephalus, unspecified: Secondary | ICD-10-CM | POA: Diagnosis not present

## 2014-12-21 DIAGNOSIS — R7989 Other specified abnormal findings of blood chemistry: Secondary | ICD-10-CM | POA: Diagnosis not present

## 2014-12-21 DIAGNOSIS — R6521 Severe sepsis with septic shock: Secondary | ICD-10-CM | POA: Diagnosis not present

## 2014-12-21 DIAGNOSIS — D62 Acute posthemorrhagic anemia: Secondary | ICD-10-CM | POA: Diagnosis not present

## 2014-12-21 DIAGNOSIS — I6522 Occlusion and stenosis of left carotid artery: Secondary | ICD-10-CM | POA: Diagnosis not present

## 2014-12-21 DIAGNOSIS — G8191 Hemiplegia, unspecified affecting right dominant side: Secondary | ICD-10-CM | POA: Diagnosis present

## 2014-12-21 DIAGNOSIS — I959 Hypotension, unspecified: Secondary | ICD-10-CM | POA: Diagnosis present

## 2014-12-21 DIAGNOSIS — R40243 Glasgow coma scale score 3-8, unspecified time: Secondary | ICD-10-CM | POA: Diagnosis not present

## 2014-12-21 DIAGNOSIS — B9620 Unspecified Escherichia coli [E. coli] as the cause of diseases classified elsewhere: Secondary | ICD-10-CM | POA: Diagnosis not present

## 2014-12-21 DIAGNOSIS — R400 Somnolence: Secondary | ICD-10-CM | POA: Diagnosis not present

## 2014-12-21 DIAGNOSIS — J9600 Acute respiratory failure, unspecified whether with hypoxia or hypercapnia: Secondary | ICD-10-CM

## 2014-12-21 DIAGNOSIS — R739 Hyperglycemia, unspecified: Secondary | ICD-10-CM | POA: Diagnosis not present

## 2014-12-21 DIAGNOSIS — J811 Chronic pulmonary edema: Secondary | ICD-10-CM | POA: Diagnosis not present

## 2014-12-21 DIAGNOSIS — E46 Unspecified protein-calorie malnutrition: Secondary | ICD-10-CM | POA: Diagnosis present

## 2014-12-21 DIAGNOSIS — I10 Essential (primary) hypertension: Secondary | ICD-10-CM | POA: Diagnosis not present

## 2014-12-21 DIAGNOSIS — Z87891 Personal history of nicotine dependence: Secondary | ICD-10-CM

## 2014-12-21 DIAGNOSIS — R0902 Hypoxemia: Secondary | ICD-10-CM | POA: Diagnosis not present

## 2014-12-21 DIAGNOSIS — T45525A Adverse effect of antithrombotic drugs, initial encounter: Secondary | ICD-10-CM | POA: Diagnosis not present

## 2014-12-21 DIAGNOSIS — N179 Acute kidney failure, unspecified: Secondary | ICD-10-CM | POA: Diagnosis not present

## 2014-12-21 DIAGNOSIS — I472 Ventricular tachycardia: Secondary | ICD-10-CM | POA: Diagnosis not present

## 2014-12-21 DIAGNOSIS — Z8249 Family history of ischemic heart disease and other diseases of the circulatory system: Secondary | ICD-10-CM | POA: Insufficient documentation

## 2014-12-21 DIAGNOSIS — J9621 Acute and chronic respiratory failure with hypoxia: Secondary | ICD-10-CM | POA: Diagnosis not present

## 2014-12-21 DIAGNOSIS — Z7902 Long term (current) use of antithrombotics/antiplatelets: Secondary | ICD-10-CM

## 2014-12-21 DIAGNOSIS — E876 Hypokalemia: Secondary | ICD-10-CM | POA: Diagnosis present

## 2014-12-21 DIAGNOSIS — I6300 Cerebral infarction due to thrombosis of unspecified precerebral artery: Secondary | ICD-10-CM | POA: Diagnosis not present

## 2014-12-21 DIAGNOSIS — J9690 Respiratory failure, unspecified, unspecified whether with hypoxia or hypercapnia: Secondary | ICD-10-CM

## 2014-12-21 DIAGNOSIS — Z888 Allergy status to other drugs, medicaments and biological substances status: Secondary | ICD-10-CM

## 2014-12-21 DIAGNOSIS — J189 Pneumonia, unspecified organism: Secondary | ICD-10-CM

## 2014-12-21 DIAGNOSIS — I6789 Other cerebrovascular disease: Secondary | ICD-10-CM | POA: Diagnosis not present

## 2014-12-21 DIAGNOSIS — A419 Sepsis, unspecified organism: Secondary | ICD-10-CM | POA: Diagnosis not present

## 2014-12-21 DIAGNOSIS — Z79899 Other long term (current) drug therapy: Secondary | ICD-10-CM | POA: Diagnosis not present

## 2014-12-21 DIAGNOSIS — N183 Chronic kidney disease, stage 3 (moderate): Secondary | ICD-10-CM | POA: Diagnosis present

## 2014-12-21 DIAGNOSIS — E870 Hyperosmolality and hypernatremia: Secondary | ICD-10-CM | POA: Diagnosis not present

## 2014-12-21 DIAGNOSIS — Z4659 Encounter for fitting and adjustment of other gastrointestinal appliance and device: Secondary | ICD-10-CM

## 2014-12-21 DIAGNOSIS — R1319 Other dysphagia: Secondary | ICD-10-CM | POA: Diagnosis not present

## 2014-12-21 DIAGNOSIS — Y95 Nosocomial condition: Secondary | ICD-10-CM | POA: Diagnosis not present

## 2014-12-21 DIAGNOSIS — N289 Disorder of kidney and ureter, unspecified: Secondary | ICD-10-CM | POA: Diagnosis not present

## 2014-12-21 DIAGNOSIS — I739 Peripheral vascular disease, unspecified: Secondary | ICD-10-CM | POA: Diagnosis present

## 2014-12-21 DIAGNOSIS — D500 Iron deficiency anemia secondary to blood loss (chronic): Secondary | ICD-10-CM | POA: Diagnosis present

## 2014-12-21 DIAGNOSIS — R29720 NIHSS score 20: Secondary | ICD-10-CM | POA: Diagnosis not present

## 2014-12-21 DIAGNOSIS — Z7982 Long term (current) use of aspirin: Secondary | ICD-10-CM | POA: Diagnosis not present

## 2014-12-21 DIAGNOSIS — R1310 Dysphagia, unspecified: Secondary | ICD-10-CM | POA: Insufficient documentation

## 2014-12-21 DIAGNOSIS — T670XXA Heatstroke and sunstroke, initial encounter: Secondary | ICD-10-CM | POA: Diagnosis not present

## 2014-12-21 DIAGNOSIS — J80 Acute respiratory distress syndrome: Secondary | ICD-10-CM | POA: Diagnosis present

## 2014-12-21 DIAGNOSIS — R601 Generalized edema: Secondary | ICD-10-CM | POA: Diagnosis not present

## 2014-12-21 DIAGNOSIS — Z9289 Personal history of other medical treatment: Secondary | ICD-10-CM

## 2014-12-21 DIAGNOSIS — I248 Other forms of acute ischemic heart disease: Secondary | ICD-10-CM | POA: Diagnosis not present

## 2014-12-21 DIAGNOSIS — Z9911 Dependence on respirator [ventilator] status: Secondary | ICD-10-CM | POA: Diagnosis not present

## 2014-12-21 DIAGNOSIS — I63019 Cerebral infarction due to thrombosis of unspecified vertebral artery: Secondary | ICD-10-CM

## 2014-12-21 DIAGNOSIS — I63312 Cerebral infarction due to thrombosis of left middle cerebral artery: Secondary | ICD-10-CM | POA: Diagnosis not present

## 2014-12-21 DIAGNOSIS — R0602 Shortness of breath: Secondary | ICD-10-CM

## 2014-12-21 DIAGNOSIS — E039 Hypothyroidism, unspecified: Secondary | ICD-10-CM | POA: Diagnosis present

## 2014-12-21 DIAGNOSIS — J155 Pneumonia due to Escherichia coli: Secondary | ICD-10-CM | POA: Diagnosis not present

## 2014-12-21 DIAGNOSIS — I6310 Cerebral infarction due to embolism of unspecified precerebral artery: Secondary | ICD-10-CM

## 2014-12-21 DIAGNOSIS — J9620 Acute and chronic respiratory failure, unspecified whether with hypoxia or hypercapnia: Secondary | ICD-10-CM | POA: Diagnosis not present

## 2014-12-21 DIAGNOSIS — I63412 Cerebral infarction due to embolism of left middle cerebral artery: Secondary | ICD-10-CM | POA: Diagnosis not present

## 2014-12-21 HISTORY — PX: RADIOLOGY WITH ANESTHESIA: SHX6223

## 2014-12-21 LAB — TRIGLYCERIDES: TRIGLYCERIDES: 74 mg/dL (ref <150)

## 2014-12-21 LAB — TYPE AND SCREEN
ABO/RH(D): O POS
Antibody Screen: NEGATIVE

## 2014-12-21 LAB — CBC
HCT: 39.3 % (ref 39.0–52.0)
Hemoglobin: 12 g/dL — ABNORMAL LOW (ref 13.0–17.0)
MCH: 27.8 pg (ref 26.0–34.0)
MCHC: 30.5 g/dL (ref 30.0–36.0)
MCV: 91.2 fL (ref 78.0–100.0)
Platelets: 149 10*3/uL — ABNORMAL LOW (ref 150–400)
RBC: 4.31 MIL/uL (ref 4.22–5.81)
RDW: 15.8 % — ABNORMAL HIGH (ref 11.5–15.5)
WBC: 7.7 10*3/uL (ref 4.0–10.5)

## 2014-12-21 LAB — COMPREHENSIVE METABOLIC PANEL
ALT: 13 U/L — ABNORMAL LOW (ref 17–63)
AST: 17 U/L (ref 15–41)
Albumin: 3 g/dL — ABNORMAL LOW (ref 3.5–5.0)
Alkaline Phosphatase: 60 U/L (ref 38–126)
Anion gap: 8 (ref 5–15)
BUN: 21 mg/dL — ABNORMAL HIGH (ref 6–20)
CO2: 27 mmol/L (ref 22–32)
Calcium: 8.3 mg/dL — ABNORMAL LOW (ref 8.9–10.3)
Chloride: 106 mmol/L (ref 101–111)
Creatinine, Ser: 1.78 mg/dL — ABNORMAL HIGH (ref 0.61–1.24)
GFR calc Af Amer: 39 mL/min — ABNORMAL LOW (ref >60)
GFR calc non Af Amer: 34 mL/min — ABNORMAL LOW (ref >60)
Glucose, Bld: 134 mg/dL — ABNORMAL HIGH (ref 65–99)
Potassium: 4.3 mmol/L (ref 3.5–5.1)
Sodium: 141 mmol/L (ref 135–145)
Total Bilirubin: 0.5 mg/dL (ref 0.3–1.2)
Total Protein: 6 g/dL — ABNORMAL LOW (ref 6.5–8.1)

## 2014-12-21 LAB — LACTIC ACID, PLASMA: Lactic Acid, Venous: 0.9 mmol/L (ref 0.5–2.0)

## 2014-12-21 LAB — PHOSPHORUS: Phosphorus: 3.3 mg/dL (ref 2.5–4.6)

## 2014-12-21 LAB — MAGNESIUM: MAGNESIUM: 1.6 mg/dL — AB (ref 1.7–2.4)

## 2014-12-21 LAB — RAPID URINE DRUG SCREEN, HOSP PERFORMED
Amphetamines: NOT DETECTED
Barbiturates: NOT DETECTED
Benzodiazepines: POSITIVE — AB
Cocaine: NOT DETECTED
Opiates: NOT DETECTED
Tetrahydrocannabinol: NOT DETECTED

## 2014-12-21 LAB — BLOOD GAS, ARTERIAL
ACID-BASE EXCESS: 0.7 mmol/L (ref 0.0–2.0)
Bicarbonate: 24.5 mEq/L — ABNORMAL HIGH (ref 20.0–24.0)
DRAWN BY: 225631
FIO2: 0.6
LHR: 15 {breaths}/min
MECHVT: 600 mL
O2 SAT: 97.8 %
PEEP/CPAP: 8 cmH2O
Patient temperature: 98.6
TCO2: 25.6 mmol/L (ref 0–100)
pCO2 arterial: 36.9 mmHg (ref 35.0–45.0)
pH, Arterial: 7.437 (ref 7.350–7.450)
pO2, Arterial: 106 mmHg — ABNORMAL HIGH (ref 80.0–100.0)

## 2014-12-21 LAB — MRSA PCR SCREENING: MRSA by PCR: NEGATIVE

## 2014-12-21 LAB — AMMONIA: AMMONIA: 24 umol/L (ref 9–35)

## 2014-12-21 LAB — LIPID PANEL
Cholesterol: 129 mg/dL (ref 0–200)
HDL: 27 mg/dL — ABNORMAL LOW (ref >40)
LDL Cholesterol: 79 mg/dL (ref 0–99)
Total CHOL/HDL Ratio: 4.8 RATIO
Triglycerides: 116 mg/dL (ref <150)
VLDL: 23 mg/dL (ref 0–40)

## 2014-12-21 LAB — ABO/RH: ABO/RH(D): O POS

## 2014-12-21 LAB — PROTIME-INR
INR: 1.18 (ref 0.00–1.49)
Prothrombin Time: 15.2 seconds (ref 11.6–15.2)

## 2014-12-21 LAB — TROPONIN I
Troponin I: 0.03 ng/mL (ref <0.031)
Troponin I: 0.04 ng/mL — ABNORMAL HIGH (ref <0.031)
Troponin I: 0.06 ng/mL — ABNORMAL HIGH (ref <0.031)

## 2014-12-21 SURGERY — RADIOLOGY WITH ANESTHESIA
Anesthesia: Monitor Anesthesia Care

## 2014-12-21 MED ORDER — PROMETHAZINE HCL 25 MG/ML IJ SOLN: 6.2500 mg | INTRAMUSCULAR | Status: DC | PRN

## 2014-12-21 MED ORDER — STROKE: EARLY STAGES OF RECOVERY BOOK
Freq: Once | Status: DC
  Filled 2014-12-21 (×2): qty 1

## 2014-12-21 MED ORDER — LABETALOL HCL 5 MG/ML IV SOLN
10.0000 mg | INTRAVENOUS | Status: DC | PRN
  Administered 2014-12-21 – 2015-01-03 (×13): 10 mg via INTRAVENOUS
  Filled 2014-12-21 (×13): qty 4

## 2014-12-21 MED ORDER — SUCCINYLCHOLINE CHLORIDE 20 MG/ML IJ SOLN
90.0000 mg | Freq: Once | INTRAMUSCULAR | Status: AC
  Administered 2014-12-21: 90 mg via INTRAVENOUS
  Filled 2014-12-21: qty 4.5

## 2014-12-21 MED ORDER — FENTANYL CITRATE (PF) 100 MCG/2ML IJ SOLN
50.0000 ug | INTRAMUSCULAR | Status: DC | PRN
  Administered 2014-12-21 – 2015-01-13 (×42): 50 ug via INTRAVENOUS
  Filled 2014-12-21 (×44): qty 2

## 2014-12-21 MED ORDER — MAGNESIUM OXIDE 400 (241.3 MG) MG PO TABS
400.0000 mg | ORAL_TABLET | Freq: Two times a day (BID) | ORAL | Status: DC
  Filled 2014-12-21 (×2): qty 1

## 2014-12-21 MED ORDER — SODIUM CHLORIDE 0.9 % IV SOLN
1000.0000 mg | Freq: Once | INTRAVENOUS | Status: AC
  Administered 2014-12-21: 1000 mg via INTRAVENOUS
  Filled 2014-12-21: qty 10

## 2014-12-21 MED ORDER — FENTANYL CITRATE (PF) 100 MCG/2ML IJ SOLN
50.0000 ug | Freq: Once | INTRAMUSCULAR | Status: AC
  Administered 2014-12-21: 50 ug via INTRAVENOUS

## 2014-12-21 MED ORDER — MIDAZOLAM HCL 2 MG/2ML IJ SOLN
1.0000 mg | INTRAMUSCULAR | Status: DC | PRN
  Administered 2014-12-21 – 2015-01-09 (×10): 1 mg via INTRAVENOUS
  Filled 2014-12-21 (×10): qty 2

## 2014-12-21 MED ORDER — ANTISEPTIC ORAL RINSE SOLUTION (CORINZ)
7.0000 mL | OROMUCOSAL | Status: DC
  Administered 2014-12-21 – 2015-01-15 (×244): 7 mL via OROMUCOSAL

## 2014-12-21 MED ORDER — FENTANYL CITRATE (PF) 100 MCG/2ML IJ SOLN
50.0000 ug | INTRAMUSCULAR | Status: AC | PRN
  Administered 2014-12-21 (×3): 50 ug via INTRAVENOUS
  Filled 2014-12-21 (×2): qty 2

## 2014-12-21 MED ORDER — PROPOFOL 1000 MG/100ML IV EMUL
5.0000 ug/kg/min | INTRAVENOUS | Status: DC
  Administered 2014-12-21: 10 ug/kg/min via INTRAVENOUS
  Administered 2014-12-21 – 2014-12-22 (×3): 20 ug/kg/min via INTRAVENOUS
  Administered 2014-12-23 – 2014-12-24 (×3): 15 ug/kg/min via INTRAVENOUS
  Administered 2014-12-24: 10 ug/kg/min via INTRAVENOUS
  Administered 2014-12-25 – 2014-12-26 (×2): 15 ug/kg/min via INTRAVENOUS
  Administered 2014-12-28: 10 ug/kg/min via INTRAVENOUS
  Filled 2014-12-21 (×14): qty 100

## 2014-12-21 MED ORDER — IOHEXOL 300 MG/ML  SOLN
150.0000 mL | Freq: Once | INTRAMUSCULAR | Status: DC | PRN
  Administered 2014-12-21: 54 mL via INTRAVENOUS
  Filled 2014-12-21: qty 150

## 2014-12-21 MED ORDER — SODIUM CHLORIDE 0.9 % IV SOLN
Freq: Once | INTRAVENOUS | Status: AC
  Administered 2014-12-21: 17:00:00 via INTRAVENOUS

## 2014-12-21 MED ORDER — MIDAZOLAM HCL 2 MG/2ML IJ SOLN
4.0000 mg | Freq: Once | INTRAMUSCULAR | Status: AC
  Administered 2014-12-21: 4 mg via INTRAVENOUS

## 2014-12-21 MED ORDER — PROPOFOL 1000 MG/100ML IV EMUL
INTRAVENOUS | Status: AC
  Administered 2014-12-21: 10 ug/kg/min via INTRAVENOUS
  Filled 2014-12-21: qty 100

## 2014-12-21 MED ORDER — SENNOSIDES-DOCUSATE SODIUM 8.6-50 MG PO TABS: 1.0000 | ORAL_TABLET | Freq: Every evening | ORAL | Status: DC | PRN

## 2014-12-21 MED ORDER — FENTANYL CITRATE (PF) 100 MCG/2ML IJ SOLN
INTRAMUSCULAR | Status: AC
  Administered 2014-12-21: 100 ug
  Filled 2014-12-21: qty 2

## 2014-12-21 MED ORDER — HYDROMORPHONE HCL 1 MG/ML IJ SOLN: 0.2500 mg | INTRAMUSCULAR | Status: DC | PRN

## 2014-12-21 MED ORDER — SODIUM CHLORIDE 0.9 % IV SOLN
INTRAVENOUS | Status: DC
  Administered 2014-12-21 – 2015-01-12 (×7): via INTRAVENOUS

## 2014-12-21 MED ORDER — CHLORHEXIDINE GLUCONATE 0.12% ORAL RINSE (MEDLINE KIT)
15.0000 mL | Freq: Two times a day (BID) | OROMUCOSAL | Status: DC
Start: 2014-12-21 — End: 2015-01-15
  Administered 2014-12-21 – 2015-01-15 (×51): 15 mL via OROMUCOSAL

## 2014-12-21 MED ORDER — NICARDIPINE HCL IN NACL 20-0.86 MG/200ML-% IV SOLN: 3.0000 mg/h | INTRAVENOUS | Status: DC

## 2014-12-21 MED ORDER — ACETAMINOPHEN 325 MG PO TABS
650.0000 mg | ORAL_TABLET | ORAL | Status: DC | PRN
  Administered 2014-12-23 – 2014-12-24 (×4): 650 mg via ORAL
  Filled 2014-12-21 (×4): qty 2

## 2014-12-21 MED ORDER — MEPERIDINE HCL 25 MG/ML IJ SOLN
6.2500 mg | INTRAMUSCULAR | Status: DC | PRN
  Filled 2014-12-21: qty 1

## 2014-12-21 MED ORDER — HYDRALAZINE HCL 20 MG/ML IJ SOLN
10.0000 mg | INTRAMUSCULAR | Status: DC | PRN
  Administered 2014-12-22 – 2014-12-29 (×3): 10 mg via INTRAVENOUS
  Filled 2014-12-21 (×5): qty 1

## 2014-12-21 MED ORDER — PANTOPRAZOLE SODIUM 40 MG IV SOLR
40.0000 mg | Freq: Every day | INTRAVENOUS | Status: DC
Start: 2014-12-21 — End: 2014-12-24
  Administered 2014-12-21 – 2014-12-23 (×4): 40 mg via INTRAVENOUS
  Filled 2014-12-21 (×4): qty 40

## 2014-12-21 MED ORDER — SODIUM CHLORIDE 0.9 % IV SOLN
3.0000 g | Freq: Three times a day (TID) | INTRAVENOUS | Status: AC
  Administered 2014-12-21 – 2014-12-31 (×30): 3 g via INTRAVENOUS
  Filled 2014-12-21 (×30): qty 3

## 2014-12-21 MED ORDER — DEXTROSE 5 % IV SOLN
0.0000 ug/min | INTRAVENOUS | Status: DC
  Administered 2014-12-21: 10 ug/min via INTRAVENOUS
  Filled 2014-12-21: qty 4

## 2014-12-21 MED ORDER — ACETAMINOPHEN 650 MG RE SUPP
650.0000 mg | RECTAL | Status: DC | PRN
  Administered 2014-12-22 – 2014-12-23 (×3): 650 mg via RECTAL
  Filled 2014-12-21 (×3): qty 1

## 2014-12-21 NOTE — Progress Notes (Signed)
SLP Cancellation Note  Patient Details Name: Darius Barber MRN: [id number] DOB: [birth year]   Cancelled treatment:       Reason Eval/Treat Not Completed: Medical issues which prohibited therapy; pt intubated when SLP arrived for assessment   ADAMS,PAT, M.S., CCC-SLP 12/21/2014, 9:03 AM

## 2014-12-21 NOTE — Anesthesia Preprocedure Evaluation (Addendum)
Anesthesia Evaluation  Patient identified by MRN, date of birth, ID band Patient awake    Reviewed: Allergy & Precautions, NPO status , Patient's Chart, lab work & pertinent test results  Airway Mallampati: III  TM Distance: >3 FB Neck ROM: Full    Dental no notable dental hx. (+) Edentulous Upper   Pulmonary former smoker,    Pulmonary exam normal breath sounds clear to auscultation       Cardiovascular hypertension, Pt. on medications and Pt. on home beta blockers + CAD, + CABG, + Peripheral Vascular Disease and +CHF  Normal cardiovascular exam+ Valvular Problems/Murmurs MR  Rhythm:Regular Rate:Normal  Echo 04/2013 - Left ventricle: The cavity size was normal. Wall thicknesswas increased in a pattern of severe LVH. Systolicfunction was vigorous. The estimated ejection fraction wasin the range of 65% to 70%. Features are consistent with apseudonormal left ventricular filling pattern, withconcomitant abnormal relaxation and increased fillingpressure (grade 2 diastolic dysfunction). - Mitral valve: Mild regurgitation.   Neuro/Psych PSYCHIATRIC DISORDERS CVA negative neurological ROS     GI/Hepatic negative GI ROS, Neg liver ROS,   Endo/Other  Hypothyroidism   Renal/GU Renal disease     Musculoskeletal negative musculoskeletal ROS (+)   Abdominal (+) + obese,   Peds  Hematology negative hematology ROS (+)   Anesthesia Other Findings   Reproductive/Obstetrics negative OB ROS                           Anesthesia Physical Anesthesia Plan  ASA: IV and emergent  Anesthesia Plan: General and MAC   Post-op Pain Management:    Induction: Intravenous, Rapid sequence and Cricoid pressure planned  Airway Management Planned: Oral ETT  Additional Equipment: Arterial line  Intra-op Plan:   Post-operative Plan: Possible Post-op intubation/ventilation  Informed Consent: I have reviewed the  patients History and Physical, chart, labs and discussed the procedure including the risks, benefits and alternatives for the proposed anesthesia with the patient or authorized representative who has indicated his/her understanding and acceptance.   Dental advisory given  Plan Discussed with: CRNA  Anesthesia Plan Comments:         Anesthesia Quick Evaluation

## 2014-12-21 NOTE — Procedures (Signed)
Intubation Procedure Note Darius Barber [id number] 18-Jul-1931  Procedure: Intubation Indications: Airway protection and maintenance  Procedure Details Consent: Unable to obtain consent because of emergent medical necessity. Time Out: Verified patient identification, verified procedure, site/side was marked, verified correct patient position, special equipment/implants available, medications/allergies/relevent history reviewed, required imaging and test results available.  Performed  Maximum sterile technique was used including gloves and mask.  Induced with 4 mg Versed and 50 mcg Fentanyl Paralyzed with 90 mg succinylcholine Easy Mask with Oral Airway Grade 1 View Obtained with Glidescope ETT placed with with attempt, secured at 24 cm Positioning confirmed with bilateral breath sounds and CO2  Evaluation Hemodynamic Status: BP stable throughout; O2 sats: stable throughout Patient's Current Condition: stable Complications: No apparent complications Patient did tolerate procedure well. Chest X-ray ordered to verify placement.  CXR: tube position acceptable.   Jusitn Salsgiver R. 12/21/2014

## 2014-12-21 NOTE — Progress Notes (Signed)
Discussed Blood pressure with Doctor Janann Colonel. Instructed to base interventions on left radial A Line.

## 2014-12-21 NOTE — Progress Notes (Signed)
STROKE TEAM PROGRESS NOTE   HISTORY Darius Barber is an 81 y.o. male transfer from [facility] after presenting with acute onset of right sided weakness and aphasia. Evaluated by specialists on call in ED and given tPA for NIHSS of 20. Transferred to [facility] after tele-neurologist suggested IR evaluation. Per ED notes, LSW 2100, around 45 minutes later wife noted abnormal breathing, ""flexing movements of left arm". Afterwards, patient was non-verbal, not moving his right side. ED notes mentioned a left gaze preference. CT head imaging reviewed, shows no acute process, signs of an old right MCA infarct. ED physician notes patient has history of fully blocked right ICA and 90% stenosis of L ICA.   Cerebral angiogram performed by Dr. Estanislado Pandy at [facility] on 12/21/2014. Report pending.  Date last known well: 11/05 Time last known well: 2100 tPA Given: yes Modified Rankin: Rankin Score=0   SUBJECTIVE (INTERVAL HISTORY) His family was at the bedside and fully updated.  Due to LOC, he is not able to describe his condition from his own perspective.  Per RN, condition is gradually improving. He follow commands intermittently on the left   OBJECTIVE Temp:  [98 F (36.7 C)] 98 F (36.7 C) (11/06 0400) Pulse Rate:  [56-65] 60 (11/06 0700) Cardiac Rhythm:  [-]  Resp:  [15-18] 15 (11/06 0700) BP: (163-184)/(59-74) 174/68 mmHg (11/06 0700) SpO2:  [99 %-100 %] 100 % (11/06 0700) Arterial Line BP: (132-160)/(59-69) 150/65 mmHg (11/06 0700) FiO2 (%):  [50 %-100 %] 100 % (11/06 0335)  CBC:  Recent Labs Lab 12/21/14 0255  WBC 7.7  HGB 12.0*  HCT 39.3  MCV 91.2  PLT 149*    Basic Metabolic Panel:  Recent Labs Lab 12/21/14 0255 12/21/14 0409  NA 141  --   K 4.3  --   CL 106  --   CO2 27  --   GLUCOSE 134*  --   BUN 21*  --   CREATININE 1.78*  --   CALCIUM 8.3*  --   MG  --  1.6*  PHOS  --  3.3    Lipid Panel:    Component Value Date/Time   CHOL 129 12/21/2014 0410    TRIG 116 12/21/2014 0410   HDL 27* 12/21/2014 0410   CHOLHDL 4.8 12/21/2014 0410   VLDL 23 12/21/2014 0410   LDLCALC 79 12/21/2014 0410   HgbA1c: No results found for: HGBA1C Urine Drug Screen:    Component Value Date/Time   LABOPIA NONE DETECTED 12/21/2014 0500   COCAINSCRNUR NONE DETECTED 12/21/2014 0500   LABBENZ POSITIVE* 12/21/2014 0500   AMPHETMU NONE DETECTED 12/21/2014 0500   THCU NONE DETECTED 12/21/2014 0500   LABBARB NONE DETECTED 12/21/2014 0500      IMAGING  Ct Abdomen Wo Contrast 12/21/2014   No evidence of significant hemorrhage within the chest, abdomen or pelvis. Confluent airspace consolidation in the posterior lower lobes. This could represent infectious infiltrate. Interstitial and alveolar edema is probably pulse present bilaterally.   Ct Head Wo Contrast 12/21/2014   No acute intracranial process.  Old RIGHT Chronic changes including old RIGHT MCA territory infarct and old LEFT basal ganglia lacunar infarcts. Findings of normal pressure hydrocephalus are similar.   Ct Chest Wo Contrast 12/21/2014   No evidence of significant hemorrhage within the chest, abdomen or pelvis. Confluent airspace consolidation in the posterior lower lobes. This could represent infectious infiltrate. Interstitial and alveolar edema is probably pulse present bilaterally.   Dg Chest Port 1 View 12/21/2014  Satisfactory ET tube position. No other significant interval change.    PHYSICAL EXAM General - Well nourished, well developed, in NAD   HEENT:  NCAT, PERRL, sclera markedly injected, intubated Cardiovascular - Regular rate and rhythm Pulmonary: CTA Abdomen: NT, ND, normal bowel sounds Extremities: No C/C/E GU- Bladder scan 540cc after 350cc void to condom cath  Neurological Exam Mental Status: Lethargic, follows some simple commands Orientation:  Unable to test Speech:  Unable to test  Cranial Nerves:  PERRL; OCRs intact, visual fields full to threat, face  grossly symmetric, hearing grossly intact; exam limited by LOC  Motor/Sensory Exam:  Tone:  Within normal limits; right sided hemiplegia; follows some commands on left; slight withdrawal on right  Coordination:  Deferred  Gait: Deferred   ASSESSMENT/PLAN Mr. Darius Barber is a 81 y.o. male with history of previous stroke, hyperlipidemia, colon cancer, hypertension, coronary artery disease, right carotid artery occlusion, vascular disease, history of a left carotid stent, and renal insufficiency, presenting with aphasia and right-sided weakness. He received IV t-PA at [facility] for NIHSS of 20.  Stroke:  Dominant probably embolic secondary to Left carotid artery clot.  Will rule out seizure and post-ictal state today  Resultant  Left hemiplegia  MRI  pending  MRA  not ordered  Carotid Doppler - 10/24/2014 - known right internal carotid artery occlusion, patent left internal carotid artery with stent 1-39%.  2D Echo - pending  LDL - 79  HgbA1c pending  VTE prophylaxis - SCDs  Diet NPO time specified  aspirin 81 mg daily and clopidogrel 75 mg daily prior to admission, now on No antithrombotic secondary to TPA  Ongoing aggressive stroke risk factor management  Therapy recommendations:  Pending  Disposition: Pending  Hypertension  Stable  Permissive hypertension (OK if < 220/120) but gradually normalize in 5-7 days  Hyperlipidemia  Home meds:  Lipitor 80 mg daily not resumed in hospital - NPO  LDL 79, goal < 70  Add Lipitor 80 mg daily when taking POs  Continue statin at discharge  Other Stroke Risk Factors  Advanced age  Cigarette smoker, quit smoking 35 years ago   Hx stroke/TIA  Coronary artery disease    Attending Daily Plan By System  Neuro  Left CVA - S/P TPA  S/P cerebral angiogram  MRI of head today  EEG ordered  Continue Keppra for now  Cardiac  CAD with previous CABG  Troponin enzymes pending;  follow  Pulmonary  Intubated  CT Chest w/o contrast - Possible infectious infiltrate. Interstitial and alveolar edema is probably pulse present bilaterally.   Discussed with Critical Care.  Antibiotics will be initiated  Remote tobacco history   GI  CT abdomen - No evidence of significant hemorrhage within the chest, abdomen or pelvis.  NPO  Nutrition consult  GU   Mild renal insufficiency  Endo  No history diabetes  No recent TSH  Heme / ID  H/H 12.0 / 39.3  Electrolytes  Mg - 1.6  Calcium 8.3  Hospital day # 0  Mikey Bussing PA-C [facility] Pager [phone] 12/21/2014, 8:51 AM  CRITICAL CARE NEUROLOGY ATTENDING NOTE Patient was seen and examined by me personally. I reviewed notes, independently viewed imaging studies, participated in medical decision making and plan of care. I have made additions or clarifications directly to the above note.  Documentation accurately reflects findings. The laboratory and radiographic studies were personally reviewed by me.  ROS pertinent positives could not be fully documented due  to LOC  Assessment and plan completed by me personally and fully documented above.  Condition is  improved   This patient is critically ill and at significant risk of neurological worsening, death and care requires constant monitoring of vital signs, hemodynamics,respiratory and cardiac monitoring, extensive review of multiple databases, frequent neurological assessment, discussion with family, other specialists and medical decision making of high complexity.  This critical care time does not reflect procedure time, or teaching time or supervisory time of PA/NP/Med Resident etc. but could involve care discussion time.  I spent 30 minutes of Neurocritical Care time in the care of  this patient.  SIGNED BY: Dr. Elissa Hefty     To contact Stroke Continuity provider, please refer to [email or web address] After hours, contact General  Neurology

## 2014-12-21 NOTE — Progress Notes (Signed)
Pt returned from CT with no complications

## 2014-12-21 NOTE — Progress Notes (Signed)
ANTIBIOTIC CONSULT NOTE - INITIAL  Pharmacy Consult for unasyn Indication: Aspiration PNA  Allergies  Allergen Reactions  . Simvastatin     REACTION: muscle aches    Patient Measurements: Height: 5\' 9"  (175.3 cm) IBW/kg (Calculated) : 70.7   Vital Signs: Temp: 97.2 F (36.2 C) (11/06 0751) Temp Source: Axillary (11/06 0751) BP: 179/71 mmHg (11/06 0800) Pulse Rate: 68 (11/06 0800) Intake/Output from previous day: 11/05 0701 - 11/06 0700 In: 300.6 [I.V.:300.6] Out: 250 [Urine:250] Intake/Output from this shift: Total I/O In: 71.7 [I.V.:71.7] Out: -   Labs:  Recent Labs  12/21/14 0255  WBC 7.7  HGB 12.0*  PLT 149*  CREATININE 1.78*   Estimated Creatinine Clearance: 36.2 mL/min (by C-G formula based on Cr of 1.78). No results for input(s): VANCOTROUGH, VANCOPEAK, VANCORANDOM, GENTTROUGH, GENTPEAK, GENTRANDOM, TOBRATROUGH, TOBRAPEAK, TOBRARND, AMIKACINPEAK, AMIKACINTROU, AMIKACIN in the last 72 hours.   Microbiology: Recent Results (from the past 720 hour(s))  MRSA PCR Screening     Status: None   Collection Time: 12/21/14  5:24 AM  Result Value Ref Range Status   MRSA by PCR NEGATIVE NEGATIVE Final    Comment:        The GeneXpert MRSA Assay (FDA approved for NASAL specimens only), is one component of a comprehensive MRSA colonization surveillance program. It is not intended to diagnose MRSA infection nor to guide or monitor treatment for MRSA infections.     Medical History: Past Medical History  Diagnosis Date  . Hyperlipidemia   . Colon cancer ([place])   . Essential hypertension   . Diverticulosis   . Coronary artery disease     Multivessel status post CABG 2006   . Carotid artery occlusion     Occluded RICA, remote left CEA, left carotid stent July 2015 - Dr. Trula Slade  . Hypothyroidism   . PAD (peripheral artery disease) ([place])   . Renal insufficiency, mild   . History of stroke     Medications:  Prescriptions prior to admission  Medication  Sig Dispense Refill Last Dose  . ALPRAZolam (XANAX) 0.25 MG tablet Take 0.25 mg by mouth at bedtime as needed.   Taking  . aspirin (ASPIRIN EC) 81 MG EC tablet Take 81 mg by mouth daily. Swallow whole.   Taking  . atenolol (TENORMIN) 25 MG tablet Take 1 tablet (25 mg total) by mouth daily. 90 tablet 3 Taking  . atorvastatin (LIPITOR) 80 MG tablet Take 80 mg by mouth daily.   Taking  . clopidogrel (PLAVIX) 75 MG tablet take 1 tablet by mouth once daily 30 tablet 6 Taking  . furosemide (LASIX) 40 MG tablet Take 40 mg by mouth. Take one tab every morning & 1/2 tab around 2 pm   Taking  . iron polysaccharides (NIFEREX) 150 MG capsule Take 150 mg by mouth daily.   Taking  . levothyroxine (SYNTHROID, LEVOTHROID) 150 MCG tablet Take 150 mcg by mouth every morning.   Taking  . losartan (COZAAR) 50 MG tablet Take 50 mg by mouth daily.   Taking  . NIFEdipine (PROCARDIA XL/ADALAT-CC) 60 MG 24 hr tablet Take 60 mg by mouth daily.   Taking  . sertraline (ZOLOFT) 100 MG tablet Take 100 mg by mouth daily.   Taking  . testosterone cypionate (DEPOTESTOTERONE CYPIONATE) 200 MG/ML injection Inject 100 mg into the muscle every 14 (fourteen) days.    Taking  . traZODone (DESYREL) 150 MG tablet Take 150 mg by mouth at bedtime.   Taking   Scheduled:  .  stroke: mapping our early stages of recovery book   Does not apply Once  . sodium chloride   Intravenous Once  . antiseptic oral rinse  7 mL Mouth Rinse 10 times per day  . chlorhexidine gluconate  15 mL Mouth Rinse BID  . pantoprazole (PROTONIX) IV  40 mg Intravenous QHS    Assessment: 81 yo male here with CVA and noted with AMS and VDRF.  WBC= 7.7, afeb and CT shows possible infectious infiltrate. Pharmacy has been consulted to dose unasyn for aspiration PNA. SCr= 1.78 (2.0 in 08/2013) and CrCl ~ 35.  11/6 unasyn>>  11/6 resp   Plan:   -Unasyn 3gm IV q8h -Will follow renal function, cultures and clinical progress  Hildred Laser, Pharm D 12/21/2014 8:57  AM

## 2014-12-21 NOTE — Anesthesia Postprocedure Evaluation (Signed)
Anesthesia Post Note  Patient: Darius Barber  Procedure(s) Performed: Procedure(s) (LRB): RADIOLOGY WITH ANESTHESIA (N/A)  Anesthesia type: MAC  Patient location: SICU  Post pain: Pain level controlled  Post assessment: Post-op Vital signs reviewed  Last Vitals: BP 180/66 mmHg  Pulse 57  Temp(Src) 36.7 C (Axillary)  Resp 15  Ht 5\' 9"  (1.753 m)  SpO2 99%  Post vital signs: Reviewed  Level of consciousness: awake  Complications: No apparent anesthesia complications

## 2014-12-21 NOTE — Progress Notes (Signed)
eLink Physician-Brief Progress Note Patient Name: Darius Barber DOB: 1931/09/06 MRN: [id number]   Date of Service  12/21/2014  HPI/Events of Note  Pt hypertensive.    eICU Interventions  Added cardene drip      Intervention Category Major Interventions: Hypertension - evaluation and management  Asencion Noble 12/21/2014, 3:58 PM

## 2014-12-21 NOTE — Progress Notes (Signed)
Neuro IR Rt 5 french femoral sheath removed at 1206.  Exoseal and manual pressure used to achieve hemostasis at 1220.  Distal pulses intact but barely palpable at +1's.  Pressure dressing applied to site and reviewed by [facility] RN.  No complications. Cheryl Dette RT-R [facility] RT-R

## 2014-12-21 NOTE — H&P (Addendum)
Stroke Consult Consulting Physician: Dr [employer]  Chief Complaint: aphasia and right sided weakness HPI: Darius Barber is an 81 y.o. male transfer from [facility] after presenting with acute onset of right sided weakness and aphasia. Evaluated by specialists on call in ED and given tPA for NIHSS of 20. Transferred to [facility] after tele-neurologist suggested IR evaluation. Per ED notes, LSW 2100, around 45 minutes later wife noted abnormal breathing, ""flexing movements of left arm". Afterwards, patient was non-verbal, not moving his right side. ED notes mention a left gaze preference. CT head imaging reviewed, shows no acute process, signs of an old right MCA infarct. ED physician notes patient has history of fully blocked right ICA and 90% stenosis of L ICA.   Date last known well: 11/05 Time last known well: 2100 tPA Given: yes Modified Rankin: Rankin Score=0  Past Medical History  Diagnosis Date  . Hyperlipidemia   . Colon cancer ([place])   . Essential hypertension   . Diverticulosis   . Coronary artery disease     Multivessel status post CABG 2006   . Carotid artery occlusion     Occluded RICA, remote left CEA, left carotid stent July 2015 - Dr. Trula Slade  . Hypothyroidism   . PAD (peripheral artery disease) ([place])   . Renal insufficiency, mild   . History of stroke     Past Surgical History  Procedure Laterality Date  . Coronary artery bypass graft  Oct 2006    [facility] - LIMA to LAD, SVG to D1, SVG to OM1, SVG to PDA  . Hemorrhoid surgery    . Hemicolectomy  2007    Dr.Matt Tsuei  . Carotid endarterectomy    . Arch aortogram N/A 08/06/2013    Procedure: ARCH AORTOGRAM;  Surgeon: Serafina Mitchell, MD;  Location: [facility] CATH LAB;  Service: Cardiovascular;  Laterality: N/A;  . Carotid angiogram N/A 08/06/2013    Procedure: CAROTID ANGIOGRAM;  Surgeon: Serafina Mitchell, MD;  Location: [facility] CATH LAB;  Service: Cardiovascular;  Laterality: N/A;  . Carotid stent insertion  Left 08/20/2013    Procedure: CAROTID STENT INSERTION;  Surgeon: Serafina Mitchell, MD;  Location: [facility] CATH LAB;  Service: Cardiovascular;  Laterality: Left;    No family history on file. Social History:  reports that he quit smoking about 35 years ago. His smoking use included Cigarettes. He has never used smokeless tobacco. He reports that he does not drink alcohol or use illicit drugs.  Allergies:  Allergies  Allergen Reactions  . Simvastatin     REACTION: muscle aches    Medications Prior to Admission  Medication Sig Dispense Refill  . ALPRAZolam (XANAX) 0.25 MG tablet Take 0.25 mg by mouth at bedtime as needed.    Marland Kitchen aspirin (ASPIRIN EC) 81 MG EC tablet Take 81 mg by mouth daily. Swallow whole.    Marland Kitchen atenolol (TENORMIN) 25 MG tablet Take 1 tablet (25 mg total) by mouth daily. 90 tablet 3  . atorvastatin (LIPITOR) 80 MG tablet Take 80 mg by mouth daily.    . clopidogrel (PLAVIX) 75 MG tablet take 1 tablet by mouth once daily 30 tablet 6  . furosemide (LASIX) 40 MG tablet Take 40 mg by mouth. Take one tab every morning & 1/2 tab around 2 pm    . iron polysaccharides (NIFEREX) 150 MG capsule Take 150 mg by mouth daily.    Marland Kitchen levothyroxine (SYNTHROID, LEVOTHROID) 150 MCG tablet Take 150 mcg by mouth every morning.    Marland Kitchen  losartan (COZAAR) 50 MG tablet Take 50 mg by mouth daily.    Marland Kitchen NIFEdipine (PROCARDIA XL/ADALAT-CC) 60 MG 24 hr tablet Take 60 mg by mouth daily.    . sertraline (ZOLOFT) 100 MG tablet Take 100 mg by mouth daily.    Marland Kitchen testosterone cypionate (DEPOTESTOTERONE CYPIONATE) 200 MG/ML injection Inject 100 mg into the muscle every 14 (fourteen) days.     . traZODone (DESYREL) 150 MG tablet Take 150 mg by mouth at bedtime.      ROS: Out of a complete 14 system review, the patient complains of only the following symptoms, and all other reviewed systems are negative. +weakness, aphasia  Physical Examination: There were no vitals filed for this visit. Physical Exam  Constitutional:  He appears well-developed and well-nourished.  Psych: Affect appropriate to situation Eyes: No scleral injection HENT: No OP obstrucion Head: Normocephalic.  Cardiovascular: Normal rate and regular rhythm.  Respiratory: Effort normal and breath sounds normal.  GI: Soft. Bowel sounds are normal. No distension. There is no tenderness.  Skin: WDI   Neurologic Examination: Mental Status: Alert, oriented, thought content appropriate.  Mild expressive aphasia, mild dysarthria. Able to follow simple commands without difficulty Cranial Nerves: II: optic discs not visualized, visual fields grossly normal, pupils equal, round, reactive to light III,IV, VI: ptosis not present, extra-ocular motions intact bilaterally V,VII: smile symmetric, facial light touch sensation normal bilaterally VIII: hearing normal bilaterally IX,X: gag reflex present XI: trapezius strength/neck flexion strength normal bilaterally XII: tongue strength normal  Motor: Right : Upper extremity    Left:     Upper extremity 5-/5 deltoid       5/5 deltoid 5-/5 biceps      5/5 biceps  5/5 triceps      5/5 triceps 5/5 hand grip      5/5 hand grip  Lower extremity     Lower extremity 5-/5 hip flexor      5/5 hip flexor 5-/5 quadricep      5/5 quadriceps  5-/5 hamstrings     5/5 hamstrings 5/5 plantar flexion       5/5 plantar flexion 5/5 plantar extension     5/5 plantar extension Tone and bulk:normal tone throughout; no atrophy noted Sensory: Pinprick and light touch intact throughout, bilaterally Deep Tendon Reflexes: 2+ and symmetric throughout Plantars: Right: downgoing   Left: downgoing Cerebellar: normal finger-to-nose, did not test HTS Gait: deferred  Laboratory Studies:   Basic Metabolic Panel: No results for input(s): NA, K, CL, CO2, GLUCOSE, BUN, CREATININE, CALCIUM, MG, PHOS in the last 168 hours.  Liver Function Tests: No results for input(s): AST, ALT, ALKPHOS, BILITOT, PROT, ALBUMIN in the last  168 hours. No results for input(s): LIPASE, AMYLASE in the last 168 hours. No results for input(s): AMMONIA in the last 168 hours.  CBC: No results for input(s): WBC, NEUTROABS, HGB, HCT, MCV, PLT in the last 168 hours.  Cardiac Enzymes: No results for input(s): CKTOTAL, CKMB, CKMBINDEX, TROPONINI in the last 168 hours.  BNP: Invalid input(s): POCBNP  CBG: No results for input(s): GLUCAP in the last 168 hours.  Microbiology: No results found for this or any previous visit.  Coagulation Studies: No results for input(s): LABPROT, INR in the last 72 hours.  Urinalysis: No results for input(s): COLORURINE, LABSPEC, PHURINE, GLUCOSEU, HGBUR, BILIRUBINUR, KETONESUR, PROTEINUR, UROBILINOGEN, NITRITE, LEUKOCYTESUR in the last 168 hours.  Invalid input(s): APPERANCEUR  Lipid Panel:     Component Value Date/Time   CHOL 135 11/19/2008 0000  TRIG 79.0 11/19/2008 0000   HDL 35.50* 11/19/2008 0000   CHOLHDL 4 11/19/2008 0000   VLDL 15.8 11/19/2008 0000   LDLCALC 84 11/19/2008 0000    HgbA1C: No results found for: HGBA1C  Urine Drug Screen:  No results found for: LABOPIA, COCAINSCRNUR, LABBENZ, AMPHETMU, THCU, LABBARB  Alcohol Level: No results for input(s): ETH in the last 168 hours.  Imaging: No results found.  Assessment: 81 y.o. male with history of prior CVA, CAD, HTN presenting with acute onset of right sided weakness and expressive aphasia. Evaluated by tele-neurology who ordered tPA and suggested neuro-IR evaluation. Upon my evaluation, speech and right sided weakness have improved. Based on reported history, differential includes CVA vs seizure with Todd's paralysis.   Diagnostic angiogram was unremarkable. On transport up to ICU, became acutely unresponsive, per RN had bilateral horizontal nystagmus type movements. Upon my examination, noted to have left gaze preference, following simple commands on his left side, not moving right side. Intubated for airway protection. Due  to question of seizure episode (similar to reported home presentation) will give keppra 1000mg  x 1 dose. Will check EEG, stat head CT.   Plan: 1. HgbA1c, fasting lipid panel 2. MRI, MRA  of the brain without contrast 3. PT consult, OT consult, Speech consult 4. Echocardiogram 5. Carotid dopplers 6. Prophylactic therapy-hold 24hrs post tPA 7. Risk factor modification 8. Telemetry monitoring 9. Frequent neuro checks 10. NPO until RN stroke swallow screen 11. EEG 12. Stat head CT for change in mental status   This patient is critically ill and at significant risk of neurological worsening, death and care requires constant monitoring of vital signs, hemodynamics,respiratory and cardiac monitoring,review of multiple databases, neurological assessment, discussion with family, other specialists and medical decision making of high complexity. I spent 75 inutes of neurocritical care time in the care of this patient.   Jim Like, DO [facility] [phone]  If 7pm- 7am, please page neurology on call as listed in [place]. 12/21/2014, 2:04 AM

## 2014-12-21 NOTE — Progress Notes (Signed)
Pt emergently intubated. 20mg  Etomidate, 100 fentanyl, 2mg  versed wasted with Darla RN.

## 2014-12-21 NOTE — Progress Notes (Signed)
Utilization Review Completed.  

## 2014-12-21 NOTE — Progress Notes (Signed)
Tracheal Aspirate obtained/sent to lab

## 2014-12-21 NOTE — Consult Note (Signed)
PULMONARY  / [place]   Name: Darius Barber MRN: [id number] DOB: 01-Dec-1931    ADMISSION DATE:  12/21/2014 CONSULTATION DATE: 12/21/2014  REQUESTING CLINICIAN: Dr. Leonie Man PRIMARY SERVICE: Neurology  CHIEF COMPLAINT:  Stroke  BRIEF PATIENT DESCRIPTION: 54 M transferred from [facility] for VIR evaluation following presumed CVA. GCS 15 on arrival to NSICU but developed AMS with GCS of 7. PCCM consulted for intubation and vent management.  SIGNIFICANT EVENTS / STUDIES:  VIR 11/6 --> WNL  LINES / TUBES: PIV ETT 11/6  CULTURES: None   ANTIBIOTICS: None  HISTORY OF PRESENT ILLNESS:  Darius Barber is an 63 M with prior CVA, CAD, carotid stenosis who presented to [facility] today for stroke-like symptoms. Patient is currently altered and unable to provide any history. He went to VIR on arrival with a normal angiogram. Shortly after arrival to NSICU patient became altered with GCS of 7. PCCM called for emergent intubation.   PAST MEDICAL HISTORY :  Past Medical History  Diagnosis Date  . Hyperlipidemia   . Colon cancer ([place])   . Essential hypertension   . Diverticulosis   . Coronary artery disease     Multivessel status post CABG 2006   . Carotid artery occlusion     Occluded RICA, remote left CEA, left carotid stent July 2015 - Dr. Trula Slade  . Hypothyroidism   . PAD (peripheral artery disease) ([place])   . Renal insufficiency, mild   . History of stroke    Past Surgical History  Procedure Laterality Date  . Coronary artery bypass graft  Oct 2006    [facility] - LIMA to LAD, SVG to D1, SVG to OM1, SVG to PDA  . Hemorrhoid surgery    . Hemicolectomy  2007    Dr.Matt Tsuei  . Carotid endarterectomy    . Arch aortogram N/A 08/06/2013    Procedure: ARCH AORTOGRAM;  Surgeon: Serafina Mitchell, MD;  Location: [facility] CATH LAB;  Service: Cardiovascular;  Laterality: N/A;  . Carotid angiogram N/A 08/06/2013    Procedure: CAROTID ANGIOGRAM;  Surgeon: Serafina Mitchell, MD;  Location: [facility] CATH LAB;  Service: Cardiovascular;  Laterality: N/A;  . Carotid stent insertion Left 08/20/2013    Procedure: CAROTID STENT INSERTION;  Surgeon: Serafina Mitchell, MD;  Location: [facility] CATH LAB;  Service: Cardiovascular;  Laterality: Left;   Prior to Admission medications   Medication Sig Start Date End Date Taking? Authorizing Valon Glasscock  ALPRAZolam (XANAX) 0.25 MG tablet Take 0.25 mg by mouth at bedtime as needed.    Historical Climmie Cronce, MD  aspirin (ASPIRIN EC) 81 MG EC tablet Take 81 mg by mouth daily. Swallow whole.    Historical Chancelor Hardrick, MD  atenolol (TENORMIN) 25 MG tablet Take 1 tablet (25 mg total) by mouth daily. 11/12/14   Carlena Bjornstad, MD  atorvastatin (LIPITOR) 80 MG tablet Take 80 mg by mouth daily.    Historical Eryck Negron, MD  clopidogrel (PLAVIX) 75 MG tablet take 1 tablet by mouth once daily 07/21/14   Serafina Mitchell, MD  furosemide (LASIX) 40 MG tablet Take 40 mg by mouth. Take one tab every morning & 1/2 tab around 2 pm 06/05/13   Historical Gustin Zobrist, MD  iron polysaccharides (NIFEREX) 150 MG capsule Take 150 mg by mouth daily.    Historical Atzel Mccambridge, MD  levothyroxine (SYNTHROID, LEVOTHROID) 150 MCG tablet Take 150 mcg by mouth every morning.    Historical Aerionna Moravek, MD  losartan (COZAAR) 50 MG tablet Take 50 mg  by mouth daily.    Historical Ahnesti Townsend, MD  NIFEdipine (PROCARDIA XL/ADALAT-CC) 60 MG 24 hr tablet Take 60 mg by mouth daily.    Historical Cornelia Walraven, MD  sertraline (ZOLOFT) 100 MG tablet Take 100 mg by mouth daily.    Historical Quanda Pavlicek, MD  testosterone cypionate (DEPOTESTOTERONE CYPIONATE) 200 MG/ML injection Inject 100 mg into the muscle every 14 (fourteen) days.  04/22/13   Historical Seville Downs, MD  traZODone (DESYREL) 150 MG tablet Take 150 mg by mouth at bedtime.    Historical Haliey Romberg, MD   Allergies  Allergen Reactions  . Simvastatin     REACTION: muscle aches    FAMILY HISTORY:  No family history on file. SOCIAL HISTORY:  reports  that he quit smoking about 35 years ago. His smoking use included Cigarettes. He has never used smokeless tobacco. He reports that he does not drink alcohol or use illicit drugs.  REVIEW OF SYSTEMS:  Unable to obtain secondary to patient condition.  SUBJECTIVE:   VITAL SIGNS:   HEMODYNAMICS:   VENTILATOR SETTINGS:   INTAKE / OUTPUT: Intake/Output    None     PHYSICAL EXAMINATION: General:  Elderly male Neuro:  Tracked on arrival HEENT:  Sclera anicteric, conjunctiva pink, MMM, old, dried blood throughout OP Neck: Trachea supple and midline, (-) LAN Cardiovascular:  RRR, NS1/S2, (-) MRG Lungs:  Coarse Mechanical BS Bilaterally  Abdomen:  Obese, NT/(+)BS Musculoskeletal:  (-) C/C; 2+ Pitting Edema Bilaterally Skin:  Grossly Intact  LABS:  CBC No results for input(s): WBC, HGB, HCT, PLT in the last 168 hours. Coag's No results for input(s): APTT, INR in the last 168 hours. BMET No results for input(s): NA, K, CL, CO2, BUN, CREATININE, GLUCOSE in the last 168 hours. Electrolytes No results for input(s): CALCIUM, MG, PHOS in the last 168 hours. Sepsis Markers No results for input(s): LATICACIDVEN, PROCALCITON, O2SATVEN in the last 168 hours. ABG No results for input(s): PHART, PCO2ART, PO2ART in the last 168 hours. Liver Enzymes No results for input(s): AST, ALT, ALKPHOS, BILITOT, ALBUMIN in the last 168 hours. Cardiac Enzymes No results for input(s): TROPONINI, PROBNP in the last 168 hours. Glucose No results for input(s): GLUCAP in the last 168 hours.  Imaging No results found.  EKG: None Obtained CXR: Reviewed. ETT in good position. Edema.  Labwork from [place].  Na 141 K 3.9 Cl 102 Co2 30.1 BUN 25 Cr 1.83 Glu 136 Ca 8.7   WBC 8.3 HgB 13.4 Hct 43.3 PLT 141  ASSESSMENT / PLAN:  1. AMS with Inability to Protect Airway: Urgently intubated.   Lung protective ventilation  VAP Prevention (Minus HOB to 30 given post-VIR  status)  SBT when cleared by neurology  2. CAD/ICM: Last echo at [facility] with Nl EF. Likely volume up.  Consider repeat echo  Would hold anticoagulants for 24 following TPA  Check repeat labs  3. Bradycardia with Hypotension: Most likely 2/2 propofol  While getting CT will change C/A/P to RO bleeding  4. CVA vs. Seizure:   Defer management to neurology  TODAY'S SUMMARY:   I have personally obtained a history, examined the patient, evaluated laboratory and imaging results, formulated the assessment and plan and placed orders.  CRITICAL CARE: The patient is critically ill with multiple organ systems failure and requires high complexity decision making for assessment and support, frequent evaluation and titration of therapies, application of advanced monitoring technologies and extensive interpretation of multiple databases. Critical Care Time devoted to patient care services described in  this note is 45 minutes.   Margarette Asal, MD Pulmonary and [place] Pager: [phone]   12/21/2014, 2:35 AM

## 2014-12-21 NOTE — Progress Notes (Addendum)
PULMONARY / CRITICAL CARE MEDICINE   Name: Darius Barber MRN: [id number] DOB: 05/13/1931    ADMISSION DATE:  12/21/2014 CONSULTATION DATE:  12/21/2014  REFERRING MD :  Dr. Leonie Man  CHIEF COMPLAINT:  Stroke  INITIAL PRESENTATION:  81 yo male from [place] with altered mental status, Rt sided weakness, aphasia with concern for CVA.  STUDIES:  11/06 CT head >> old Rt MCA infarct, Lt basal ganglia infarct 11/06 CT chest >> lower ASD with air bronchograms 11/06 CT abd/pelvis >> no acute findings  SIGNIFICANT EVENTS: 11/06 From [place], tPA, neuro IR, intubated for airway protection  SUBJECTIVE:  Remains on full vent support with increased PEEP/FiO2.  VITAL SIGNS: Temp:  [97.2 F (36.2 C)-98 F (36.7 C)] 97.2 F (36.2 C) (11/06 0751) Pulse Rate:  [56-68] 68 (11/06 0800) Resp:  [15-18] 15 (11/06 0800) BP: (163-184)/(59-74) 179/71 mmHg (11/06 0800) SpO2:  [99 %-100 %] 100 % (11/06 0800) Arterial Line BP: (132-160)/(59-72) 153/72 mmHg (11/06 0800) FiO2 (%):  [50 %-100 %] 100 % (11/06 0335) VENTILATOR SETTINGS: Vent Mode:  [-] PRVC FiO2 (%):  [50 %-100 %] 100 % Set Rate:  [15 bmp] 15 bmp Vt Set:  [600 mL] 600 mL PEEP:  [5 cmH20-8 cmH20] 8 cmH20 INTAKE / OUTPUT:  Intake/Output Summary (Last 24 hours) at 12/21/14 0828 Last data filed at 12/21/14 0800  Gross per 24 hour  Intake 350.63 ml  Output    250 ml  Net 100.63 ml    PHYSICAL EXAMINATION: General: somnolent Neuro:  Opens eyes with stimulation, follows simple commands, moves Lt side HEENT:  ETT in place Cardiovascular:  Regular, no murmur Lungs:  B/l crackles Abdomen:  Soft, non tender Musculoskeletal:  No edema Skin:  No rashes  LABS:  CBC  Recent Labs Lab 12/21/14 0255  WBC 7.7  HGB 12.0*  HCT 39.3  PLT 149*   Coag's  Recent Labs Lab 12/21/14 0255  INR 1.18   BMET  Recent Labs Lab 12/21/14 0255  NA 141  K 4.3  CL 106  CO2 27  BUN 21*  CREATININE 1.78*  GLUCOSE 134*    Electrolytes  Recent Labs Lab 12/21/14 0255 12/21/14 0409  CALCIUM 8.3*  --   MG  --  1.6*  PHOS  --  3.3   Sepsis Markers  Recent Labs Lab 12/21/14 0255  LATICACIDVEN 0.9   ABG No results for input(s): PHART, PCO2ART, PO2ART in the last 168 hours.   Liver Enzymes  Recent Labs Lab 12/21/14 0255  AST 17  ALT 13*  ALKPHOS 60  BILITOT 0.5  ALBUMIN 3.0*   Glucose No results for input(s): GLUCAP in the last 168 hours.  Imaging Ct Abdomen Wo Contrast  12/21/2014  CLINICAL DATA:  TPA.  Concern for bleeding. EXAM: CT CHEST, ABDOMEN AND PELVIS WITHOUT CONTRAST TECHNIQUE: Multidetector CT imaging of the chest, abdomen and pelvis was performed following the standard protocol without IV contrast. COMPARISON:  None. FINDINGS: CT CHEST FINDINGS There is confluent consolidation with air bronchograms in the posterior lower lobes. This could represent infectious infiltrate. Ground-glass opacities are scattered elsewhere, and there also is interlobular septal thickening. This could represent interstitial and alveolar edema. Trace pleural effusions. Central airways are patent. No mediastinal hemorrhage. CT ABDOMEN AND PELVIS FINDINGS No peritoneal blood or free air. There are unremarkable unenhanced appearances of the liver, spleen, pancreas, adrenals and kidneys. There is contrast material in the renal collecting systems and ureters due to earlier contrast administration. The abdominal aorta is normal  in caliber with moderate atherosclerotic calcification. Bowel is unremarkable. IMPRESSION: No evidence of significant hemorrhage within the chest, abdomen or pelvis. Confluent airspace consolidation in the posterior lower lobes. This could represent infectious infiltrate. Interstitial and alveolar edema is probably pulse present bilaterally. Electronically Signed   By: Andreas Newport M.D.   On: 12/21/2014 03:53   Ct Head Wo Contrast  12/21/2014  CLINICAL DATA:  Follow-up code stroke, altered  mental status. Acute onset RIGHT-sided weakness and aphasia. History of occluded RIGHT internal carotid artery and 90% stenosis LEFT internal carotid artery. EXAM: CT HEAD WITHOUT CONTRAST TECHNIQUE: Contiguous axial images were obtained from the base of the skull through the vertex without intravenous contrast. COMPARISON:  CT head December 20, 2014 FINDINGS: Small amount of intravascular contrast compatible with recent procedure. No intraparenchymal hemorrhage, mass effect, midline shift. RIGHT frontoparietal encephalomalacia with extension to RIGHT basal ganglia. General preservation of gray-white matter differentiation. Moderate to severe ventriculomegaly, with disproportionate sulcal effacement at the convexities, unchanged. Confluent supratentorial white matter hypodensities are similar compatible with chronic small vessel ischemic disease. Old LEFT basal ganglia lacunar infarcts. RIGHT cerebral peduncle volume loss compatible with wallerian degeneration. No abnormal extra-axial fluid collection. Basal cisterns are patent. Status post bilateral ocular lens implants. Poor dentition with RIGHT maxillary dental carie. Mild to moderate paranasal sinusitis. The mastoid air cells are well aerated. No skull fracture. Life-support lines in place. IMPRESSION: No acute intracranial process.  Old RIGHT Chronic changes including old RIGHT MCA territory infarct and old LEFT basal ganglia lacunar infarcts. Findings of normal pressure hydrocephalus are similar. Electronically Signed   By: Elon Alas M.D.   On: 12/21/2014 03:37   Ct Chest Wo Contrast  12/21/2014  CLINICAL DATA:  TPA.  Concern for bleeding. EXAM: CT CHEST, ABDOMEN AND PELVIS WITHOUT CONTRAST TECHNIQUE: Multidetector CT imaging of the chest, abdomen and pelvis was performed following the standard protocol without IV contrast. COMPARISON:  None. FINDINGS: CT CHEST FINDINGS There is confluent consolidation with air bronchograms in the posterior lower  lobes. This could represent infectious infiltrate. Ground-glass opacities are scattered elsewhere, and there also is interlobular septal thickening. This could represent interstitial and alveolar edema. Trace pleural effusions. Central airways are patent. No mediastinal hemorrhage. CT ABDOMEN AND PELVIS FINDINGS No peritoneal blood or free air. There are unremarkable unenhanced appearances of the liver, spleen, pancreas, adrenals and kidneys. There is contrast material in the renal collecting systems and ureters due to earlier contrast administration. The abdominal aorta is normal in caliber with moderate atherosclerotic calcification. Bowel is unremarkable. IMPRESSION: No evidence of significant hemorrhage within the chest, abdomen or pelvis. Confluent airspace consolidation in the posterior lower lobes. This could represent infectious infiltrate. Interstitial and alveolar edema is probably pulse present bilaterally. Electronically Signed   By: Andreas Newport M.D.   On: 12/21/2014 03:53   Dg Chest Port 1 View  12/21/2014  CLINICAL DATA:  Respiratory failure EXAM: PORTABLE CHEST 1 VIEW COMPARISON:  12/20/2014 FINDINGS: Endotracheal tube tip is just below the clavicular heads. Mild central vascular prominence, unchanged. No dense airspace consolidation. No large effusion. No pneumothorax. IMPRESSION: Satisfactory ET tube position. No other significant interval change. Electronically Signed   By: Andreas Newport M.D.   On: 12/21/2014 02:55     ASSESSMENT / PLAN:  PULMONARY ETT 11/06 A: Acute respiratory failure 2nd to aspiration PNA and compromised airway. P:   Full vent support F/u CXR, ABG  CARDIOVASCULAR Lt radial aline 11/06 >> A:  Hx of CAD, HTN, HLD,  PAD. P:  Hold ASA, plavix until okay with neurology Hold outpt tenormin, lipitor, lasix, cozaar, nifedipine PRN hydralazine to keep SBP < 180  RENAL A:   CKD stage 3. P:   Monitor renal fx, urine outpt  GASTROINTESTINAL A:    Nutrition. P:   Tube feeds if unable to extubate soon Protonix for SUP  HEMATOLOGIC A:   No acute issues. P:  F/u CBC SCD for DVT prevention  INFECTIOUS A:   Aspiration pneumonia. P:   Day 1 unasyn Defer blood cultures since he recently received tPA >> need to avoid blood sticks  Sputum 11/06 >>  ENDOCRINE A:   Hx of Hypothyroidism. P:   Continue synthroid F/u TSH  NEUROLOGIC A:   Aphasia/Rt sided weakness with concern for CVA s/p tPA and neuro IR. P:   RASS goal: 0 F/u MRI brain, EEG Hold outpt zoloft, trazodone  Updated pt's family at bedside.  CC time 43 minutes.  Chesley Mires, MD [facility] Pulmonary/Critical Care 12/21/2014, 8:45 AM Pager:  [phone] After 3pm call: [phone]

## 2014-12-22 ENCOUNTER — Inpatient Hospital Stay (HOSPITAL_COMMUNITY): Payer: Medicare HMO

## 2014-12-22 ENCOUNTER — Encounter (HOSPITAL_COMMUNITY): Payer: Self-pay | Admitting: Radiology

## 2014-12-22 DIAGNOSIS — I6789 Other cerebrovascular disease: Secondary | ICD-10-CM

## 2014-12-22 DIAGNOSIS — J9601 Acute respiratory failure with hypoxia: Secondary | ICD-10-CM

## 2014-12-22 DIAGNOSIS — I25709 Atherosclerosis of coronary artery bypass graft(s), unspecified, with unspecified angina pectoris: Secondary | ICD-10-CM | POA: Insufficient documentation

## 2014-12-22 DIAGNOSIS — Z8249 Family history of ischemic heart disease and other diseases of the circulatory system: Secondary | ICD-10-CM | POA: Insufficient documentation

## 2014-12-22 DIAGNOSIS — I63231 Cerebral infarction due to unspecified occlusion or stenosis of right carotid arteries: Secondary | ICD-10-CM | POA: Insufficient documentation

## 2014-12-22 DIAGNOSIS — I6300 Cerebral infarction due to thrombosis of unspecified precerebral artery: Secondary | ICD-10-CM | POA: Insufficient documentation

## 2014-12-22 DIAGNOSIS — R400 Somnolence: Secondary | ICD-10-CM

## 2014-12-22 DIAGNOSIS — E785 Hyperlipidemia, unspecified: Secondary | ICD-10-CM | POA: Insufficient documentation

## 2014-12-22 LAB — CBC
HCT: 35.7 % — ABNORMAL LOW (ref 39.0–52.0)
Hemoglobin: 11.1 g/dL — ABNORMAL LOW (ref 13.0–17.0)
MCH: 27.8 pg (ref 26.0–34.0)
MCHC: 31.1 g/dL (ref 30.0–36.0)
MCV: 89.5 fL (ref 78.0–100.0)
PLATELETS: 134 10*3/uL — AB (ref 150–400)
RBC: 3.99 MIL/uL — AB (ref 4.22–5.81)
RDW: 15.9 % — ABNORMAL HIGH (ref 11.5–15.5)
WBC: 10.8 10*3/uL — ABNORMAL HIGH (ref 4.0–10.5)

## 2014-12-22 LAB — COMPREHENSIVE METABOLIC PANEL
ALBUMIN: 2.6 g/dL — AB (ref 3.5–5.0)
ALT: 11 U/L — ABNORMAL LOW (ref 17–63)
ANION GAP: 6 (ref 5–15)
AST: 23 U/L (ref 15–41)
Alkaline Phosphatase: 51 U/L (ref 38–126)
BILIRUBIN TOTAL: 0.9 mg/dL (ref 0.3–1.2)
BUN: 21 mg/dL — ABNORMAL HIGH (ref 6–20)
CHLORIDE: 110 mmol/L (ref 101–111)
CO2: 25 mmol/L (ref 22–32)
Calcium: 8 mg/dL — ABNORMAL LOW (ref 8.9–10.3)
Creatinine, Ser: 1.75 mg/dL — ABNORMAL HIGH (ref 0.61–1.24)
GFR calc Af Amer: 40 mL/min — ABNORMAL LOW (ref >60)
GFR, EST NON AFRICAN AMERICAN: 35 mL/min — AB (ref >60)
Glucose, Bld: 131 mg/dL — ABNORMAL HIGH (ref 65–99)
POTASSIUM: 3.8 mmol/L (ref 3.5–5.1)
Sodium: 141 mmol/L (ref 135–145)
TOTAL PROTEIN: 5.2 g/dL — AB (ref 6.5–8.1)

## 2014-12-22 LAB — HEMOGLOBIN A1C
Hgb A1c MFr Bld: 6.2 % — ABNORMAL HIGH (ref 4.8–5.6)
Mean Plasma Glucose: 131 mg/dL

## 2014-12-22 LAB — TROPONIN I
TROPONIN I: 0.08 ng/mL — AB (ref <0.031)
TROPONIN I: 0.09 ng/mL — AB (ref <0.031)

## 2014-12-22 LAB — TSH: TSH: 1.901 u[IU]/mL (ref 0.350–4.500)

## 2014-12-22 MED ORDER — ASPIRIN 81 MG PO CHEW
81.0000 mg | CHEWABLE_TABLET | Freq: Every day | ORAL | Status: DC
  Administered 2014-12-23 – 2015-01-05 (×14): 81 mg via ORAL
  Filled 2014-12-22 (×14): qty 1

## 2014-12-22 MED ORDER — ASPIRIN 300 MG RE SUPP
300.0000 mg | Freq: Every day | RECTAL | Status: DC
  Administered 2014-12-22: 300 mg via RECTAL
  Filled 2014-12-22: qty 1

## 2014-12-22 MED ORDER — ASPIRIN 325 MG PO TABS: 325.0000 mg | ORAL_TABLET | Freq: Every day | ORAL | Status: DC

## 2014-12-22 MED ORDER — ENOXAPARIN SODIUM 40 MG/0.4ML ~~LOC~~ SOLN
40.0000 mg | SUBCUTANEOUS | Status: DC
  Administered 2014-12-22 – 2014-12-24 (×3): 40 mg via SUBCUTANEOUS
  Filled 2014-12-22 (×3): qty 0.4

## 2014-12-22 MED ORDER — CLOPIDOGREL BISULFATE 75 MG PO TABS
75.0000 mg | ORAL_TABLET | Freq: Every day | ORAL | Status: DC
  Administered 2014-12-22 – 2015-01-05 (×14): 75 mg via ORAL
  Filled 2014-12-22 (×14): qty 1

## 2014-12-22 NOTE — Care Management Note (Signed)
Case Management Note  Patient Details  Name: Darius Barber MRN: [id number] Date of Birth: [birth year]  Subjective/Objective:    Pt admitted on 12/21/14 s/p Lt stroke with TPA given.  PTA, pt independent,lives with spouse.                  Action/Plan: Will follow for discharge plans as pt progresses.  Recommend PT/OT consults when able to tolerate.     Expected Discharge Date:                  Expected Discharge Plan:  [place]  In-House Referral:     Discharge planning Services  CM Consult  Post Acute Care Choice:    Choice offered to:     DME Arranged:    DME Agency:     HH Arranged:    [place] Agency:     Status of Service:  In process, will continue to follow  Medicare Important Message Given:    Date Medicare IM Given:    Medicare IM give by:    Date Additional Medicare IM Given:    Additional Medicare Important Message give by:     If discussed at [place] of Stay Meetings, dates discussed:    Additional Comments:  Reinaldo Raddle, RN, BSN  Trauma/Neuro ICU Case Manager [phone]

## 2014-12-22 NOTE — Procedures (Signed)
ELECTROENCEPHALOGRAM REPORT  Date of Study: 12/22/2014  Patient's Name: Darius Barber MRN: [id number] Date of Birth: 1931-10-02  Referring Provider: Dr. Rosalin Hawking  Clinical History: This is an 81 year old man with acute onset of right sided weakness and aphasia, who was noted to have abnormal breathing and "flexing movements of left arm". Afterwards, patient was non-verbal, not moving his right side. ED notes mentioned a left gaze preference.  Medications: acetaminophen (TYLENOL) tablet 650 mg Ampicillin-Sulbactam (UNASYN) 3 g in sodium chloride 0.9 % 100 mL IVPB aspirin suppository 300 mg hydrALAZINE (APRESOLINE) injection 10 mg labetalol (NORMODYNE,TRANDATE) injection 10 mg nicardipine (CARDENE) 20mg  in 0.86% saline 229ml IV infusion (0.1 mg/ml) pantoprazole (PROTONIX) injection 40 mg promethazine (PHENERGAN) injection 6.25-12.5 mg senna-docusate (Senokot-S) tablet 1 tablet  Technical Summary: A multichannel digital EEG recording measured by the international 10-20 system with electrodes applied with paste and impedances below 5000 ohms performed as portable with EKG monitoring in an awake and intubated patient off sedating medications.  Hyperventilation and photic stimulation were not performed.  The digital EEG was referentially recorded, reformatted, and digitally filtered in a variety of bipolar and referential montages for optimal display.   Description: The patient is awake and intubated during the recording. No sedating medications listed.  During maximal wakefulness, there is a poorly sustained low voltage 9 Hz posterior dominant rhythm that poorly attenuates to eye opening and eye closure. There is a moderate amount of diffuse low voltage 4-5 Hz theta and 2-3 Hz delta slowing of the waking background.  Normal sleep architecture is not seen. Hyperventilation and photic stimulation were not performed. There were no epileptiform discharges or electrographic seizures seen.    EKG  lead showed sinus tachycardia  Impression: This awake EEG is abnormal due to moderate diffuse slowing of the waking background.  Clinical Correlation of the above findings indicates diffuse cerebral dysfunction that is non-specific in etiology and can be seen with hypoxic/ischemic injury, toxic/metabolic encephalopathies, neurodegenerative disorders, or medication effect.  The absence of epileptiform discharges does not rule out a clinical diagnosis of epilepsy.  Clinical correlation is advised.   Ellouise Newer, M.D.

## 2014-12-22 NOTE — Progress Notes (Signed)
Pt transported to/from CT without event. 

## 2014-12-22 NOTE — Progress Notes (Signed)
Initial Nutrition Assessment   INTERVENTION:   If pt remains intubated recommend: Jevity 1.2 @ 50 ml/hr 30 ml Prostat TID Provides: 1740 kcal, 111 grams protein, and 972 ml H2O TF regimen and propofol at current rate providing 1877 total kcal/day (102 % of kcal needs)  NUTRITION DIAGNOSIS:   Inadequate oral intake related to inability to eat as evidenced by NPO status.  GOAL:   Patient will meet greater than or equal to 90% of their needs  MONITOR:   Vent status, Labs, Weight trends  REASON FOR ASSESSMENT:   Consult Assessment of nutrition requirement/status  ASSESSMENT:   81 yo male from [place] with altered mental status, Rt sided weakness, aphasia with concern for CVA. S/P tPA and neuro IR. Acute respiratory failure 2nd to aspiration PNA and compromised airway.  Patient is currently intubated on ventilator support MV: 7.4 L/min Temp (24hrs), Avg:98.8 F (37.1 C), Min:98.2 F (36.8 C), Max:99.7 F (37.6 C)  Propofol: 5.2 ml/hr provides 137 kcal per day from lipids  Wife at bedside and reports that pt has good appetite with no recent weight changes. Nutrition-Focused physical exam completed. Findings are no fat depletion, no muscle depletion, and no edema.  Pt discussed during ICU rounds and with RN.    Diet Order:  Diet NPO time specified  Skin:  Reviewed, no issues  Last BM:  unknown  Height:   Ht Readings from Last 1 Encounters:  12/21/14 5\' 9"  (1.753 m)   Weight:   Wt Readings from Last 1 Encounters:  12/09/14 200 lb (90.719 kg)   Ideal Body Weight:  72.7 kg  BMI:  29.6  Estimated Nutritional Needs:   Kcal:  1840  Protein:  110-120 grams  Fluid:  > 1.9 L/day  EDUCATION NEEDS:   No education needs identified at this time  [place], [place], [place] Pager [phone] After Hours Pager

## 2014-12-22 NOTE — Progress Notes (Signed)
OT Cancellation Note  Patient Details Name: Darius Barber MRN: [id number] DOB: 1931/12/02   Cancelled Treatment:    Reason Eval/Treat Not Completed: Patient not medically ready Pt on bedrest. [place], OTR/L  [phone] 12/22/2014 12/22/2014, 8:37 AM

## 2014-12-22 NOTE — Progress Notes (Signed)
EEG completed, results pending. 

## 2014-12-22 NOTE — Progress Notes (Signed)
STROKE TEAM PROGRESS NOTE   HISTORY Darius Barber is an 81 y.o. male transfer from [facility] after presenting with acute onset of right sided weakness and aphasia. Evaluated by specialists on call in ED (at [facility]) and given tPA for NIHSS of 20. Transferred to [facility] after tele-neurologist suggested IR evaluation. Per ED notes, patient was last seen normal at 9:00 PM on 12/20/14, ~45 minutes after wife noted abnormal breathing and reported "flexing movements of left arm". Afterwards, patient was non-verbal, not moving his right side. ED notes mentioned a left gaze preference. CT head imaging reviewed, shows no acute process, signs of an old right MCA infarct. ED physician notes patient has history of fully blocked right ICA and 90% stenosis of L ICA.   Cerebral angiogram performed by Dr. Estanislado Pandy at [facility] on 12/21/2014, no intervention performed.  Date last known well: 11/05 Time last known well: 2100 tPA Given: yes Modified Rankin: Rankin Score=0   SUBJECTIVE (INTERVAL HISTORY) Hypertensive overnight, Cardene gtt initiated. Following some commands.    OBJECTIVE Temp:  [97.2 F (36.2 C)-99.7 F (37.6 C)] 98.3 F (36.8 C) (11/07 0400) Pulse Rate:  [54-85] 54 (11/07 0700) Cardiac Rhythm:  [-] Normal sinus rhythm (11/07 0000) Resp:  [12-22] 15 (11/07 0700) BP: (102-221)/(51-91) 149/52 mmHg (11/07 0700) SpO2:  [93 %-100 %] 96 % (11/07 0700) Arterial Line BP: (76-173)/(43-86) 102/86 mmHg (11/07 0700) FiO2 (%):  [40 %-60 %] 40 % (11/07 0600)  CBC:   Recent Labs Lab 12/21/14 0255 12/22/14 0316  WBC 7.7 10.8*  HGB 12.0* 11.1*  HCT 39.3 35.7*  MCV 91.2 89.5  PLT 149* 134*    Basic Metabolic Panel:   Recent Labs Lab 12/21/14 0255 12/21/14 0409 12/22/14 0316  NA 141  --  141  K 4.3  --  3.8  CL 106  --  110  CO2 27  --  25  GLUCOSE 134*  --  131*  BUN 21*  --  21*  CREATININE 1.78*  --  1.75*  CALCIUM 8.3*  --  8.0*  MG  --  1.6*  --   PHOS  --   3.3  --     Lipid Panel:     Component Value Date/Time   CHOL 129 12/21/2014 0410   TRIG 74 12/21/2014 1549   HDL 27* 12/21/2014 0410   CHOLHDL 4.8 12/21/2014 0410   VLDL 23 12/21/2014 0410   LDLCALC 79 12/21/2014 0410   Urine Drug Screen:     Component Value Date/Time   LABOPIA NONE DETECTED 12/21/2014 0500   COCAINSCRNUR NONE DETECTED 12/21/2014 0500   LABBENZ POSITIVE* 12/21/2014 0500   AMPHETMU NONE DETECTED 12/21/2014 0500   THCU NONE DETECTED 12/21/2014 0500   LABBARB NONE DETECTED 12/21/2014 0500      IMAGING  Ct Abdomen Wo Contrast 12/21/2014   No evidence of significant hemorrhage within the chest, abdomen or pelvis. Confluent airspace consolidation in the posterior lower lobes. This could represent infectious infiltrate. Interstitial and alveolar edema is probably pulse present bilaterally.   Ct Head Wo Contrast 12/21/2014   No acute intracranial process.  Old RIGHT Chronic changes including old RIGHT MCA territory infarct and old LEFT basal ganglia lacunar infarcts. Findings of normal pressure hydrocephalus are similar.   Ct Chest Wo Contrast 12/21/2014   No evidence of significant hemorrhage within the chest, abdomen or pelvis. Confluent airspace consolidation in the posterior lower lobes. This could represent infectious infiltrate. Interstitial and alveolar edema is probably pulse  present bilaterally.   Dg Chest Port 1 View 12/21/2014   Satisfactory ET tube position. No other significant interval change.  MRI Brain 12/21/14 1. Left corona radiata 2.5 cm infarct without hemorrhage or mass effect. 2. No other acute intracranial abnormality. Chronic ischemic disease. Chronic right ICA occlusion. 3. Vertex scalp hematoma.  PHYSICAL EXAM  General: NAD. Sedated. On ventilator. FOllows some commands.  HEENT: PERRL, EOMI. Moist mucus membranes. OETT in place.  Neck: Supple, no lymphadenopathy or carotid bruits Lungs: Clear to ascultation bilaterally, normal work  of respiration, no wheezes, rales, rhonchi Heart: RRR, no murmurs, gallops, or rubs Abdomen: Soft, mildly distended, BS + Extremities: No cyanosis, clubbing, or edema  Neurological Exam  Mental Status -  Sedated on ventilator.  Cranial Nerves II - XII - II - Unable to assess III, IV, VI - Extraocular movements intact. Blinks eyes to command V - Unable to assess. VII - Unable to assess. VIII - Hearing impaired X - Gag intact. XI - Unable to assess. XII - Unable to assess.  Motor Strength - Difficult to assess given mild sedation. Strength seems to be intact in left upper and lower extremities. Right extremity weakness but not completely following commands.  Motor Tone - Muscle tone was assessed at the neck and appendages and was normal.  Reflexes - The patient's reflexes were 1+ in all extremities and he had no pathological reflexes.  Sensory - Unable to assess  Coordination - Unable to assess  Gait and Station - Unable to assess   ASSESSMENT/PLAN Mr. Darius Barber is a 82 y.o. male with history of previous CVA, HLD, h/o Colon CA, HTN, CAD, right carotid artery occlusion, vascular disease, history of a left carotid stent, and renal insufficiency, presenting with aphasia and right-sided weakness. He received IV t-PA at [facility] for NIHSS of 20.  Stroke:  Dominant Left Corona Radiata, Likely Embolic   Resultant  Left hemiplegia, aphasia  MRI: Left corona radiata 2.5 cm infarct without hemorrhage or mass effect.  Catheter Angiogram: No obvious blockage, will obtain report.   Carotid Doppler (10/24/2014): Known right internal carotid artery occlusion, patent left internal  carotid artery with stent 1-39%. Will obtain report from catheter angiogram.  2D Echo - pending  EEG: Pending  LDL - 79  HgbA1c Pending  VTE prophylaxis - SCDs. Start Lovenox 40 mg SQ qd Diet NPO time specified  aspirin 81 mg daily and clopidogrel 75 mg daily prior to admission, start ASA  suppository. Restart  Plavix when able to take po.   Ongoing aggressive stroke risk factor management  Therapy recommendations:  Pending  Disposition: Pending  Aspiration Pneumonia:  Management per PCCM. Patient currently intubated. Okay to extubate from neurological perspective  Continue Unasyn  Hypertension  Elevated overnight  Cardene gtt for SBP >180  Hyperlipidemia  Home meds:  Lipitor 80 mg daily not resumed in hospital. NPO  LDL 79, goal < 70  Add Lipitor 80 mg daily when taking PO  Continue statin at discharge  Other Stroke Risk Factors  Advanced age  Cigarette smoker, quit smoking 35 years ago   Hx stroke/TIA  Coronary artery disease   Patient following some commands today, blinking eyes, closing appropriately. Strength intact on the left, seems weaker on the right. Hopeful for extubation today. Plan for EEG as corona radiata stroke does not completely explain significant decrease in mental status. Continue ASA, restart Plavix when able to take po.    Natasha Bence, MD PGY-3, Internal Medicine Pager:  [phone]   To contact Stroke Continuity provider, please refer to [email or web address] After hours, contact General Neurology

## 2014-12-22 NOTE — Progress Notes (Signed)
PULMONARY  / [place]   Name: Darius Barber MRN: [id number] DOB: [birth year]    ADMISSION DATE:  12/21/2014 CONSULTATION DATE: 12/21/2014  REQUESTING CLINICIAN: Dr. Leonie Man PRIMARY SERVICE: Neurology  CHIEF COMPLAINT:  Stroke  BRIEF PATIENT DESCRIPTION: 81 M transferred from [facility] for VIR evaluation following presumed CVA. GCS 15 on arrival to NSICU but developed AMS with GCS of 7. PCCM consulted for intubation and vent management.  SIGNIFICANT EVENTS: 11/6 - Admitted to hospital  STUDIES:  Portable CXR 11/7 - ETT 4.4cm above carina. No focal opacity. CT Head 11/7 - Left corona radiata infarct. No hemorrhage. Old infarcts.   LINES / TUBES: ETT 11/6  CULTURES: Tracheal Aspirate 11/6>>  ANTIBIOTICS: Unasyn 11/6>>  HISTORY OF PRESENT ILLNESS:  Darius Barber is an 81 M with prior CVA, CAD, carotid stenosis who presented to [facility] today for stroke-like symptoms. Patient is currently altered and unable to provide any history. He went to VIR on arrival with a normal angiogram. Shortly after arrival to NSICU patient became altered with GCS of 7. PCCM called for emergent intubation.   SUBJECTIVE: No acute events overnight. Patient resting comfortably in bed. Wife at bedside.  REVIEW OF SYSTEMS:  Unable to obtain secondary to patient's intubation.  VITAL SIGNS: Temp:  [98.2 F (36.8 C)-99.7 F (37.6 C)] 98.2 F (36.8 C) (11/07 1100) Pulse Rate:  [54-81] 54 (11/07 0700) Resp:  [13-22] 15 (11/07 0700) BP: (102-221)/(51-91) 149/52 mmHg (11/07 0700) SpO2:  [93 %-100 %] 96 % (11/07 0700) Arterial Line BP: (76-173)/(47-86) 102/86 mmHg (11/07 0700) FiO2 (%):  [40 %-50 %] 40 % (11/07 0900) HEMODYNAMICS:   VENTILATOR SETTINGS: Vent Mode:  [-] PRVC FiO2 (%):  [40 %-50 %] 40 % Set Rate:  [15 bmp] 15 bmp Vt Set:  [600 mL] 600 mL PEEP:  [5 cmH20-8 cmH20] 8 cmH20 Plateau Pressure:  [24 cmH20-27 cmH20] 26 cmH20 INTAKE / OUTPUT: Intake/Output       11/06 0701 - 11/07 0700 11/07 0701 - 11/08 0700   I.V. 757.5    IV Piggyback 200    Total Intake 957.5     Urine 2505 200   Total Output 2505 200   Net -1547.5 -200          PHYSICAL EXAMINATION: General:  Obese male. No distress. Wife at bedside.  Integument:  Warm & dry. No rash on exposed skin.  HEENT:  No scleral injection or icterus. Endotracheal tube in place.  Cardiovascular:  Regular rate. No edema.  Pulmonary:  Good aeration & clear to auscultation bilaterally. Symmetric chest wall rise on ventilator. Abdomen: Soft. Normal bowel sounds. Protuberant. Neurological: Patient will spontaneously move his bilateral lower extremities as well as his left hand. Not following commands.   LABS:  CBC  Recent Labs Lab 12/21/14 0255 12/22/14 0316  WBC 7.7 10.8*  HGB 12.0* 11.1*  HCT 39.3 35.7*  PLT 149* 134*   Coag's  Recent Labs Lab 12/21/14 0255  INR 1.18   BMET  Recent Labs Lab 12/21/14 0255 12/22/14 0316  NA 141 141  K 4.3 3.8  CL 106 110  CO2 27 25  BUN 21* 21*  CREATININE 1.78* 1.75*  GLUCOSE 134* 131*   Electrolytes  Recent Labs Lab 12/21/14 0255 12/21/14 0409 12/22/14 0316  CALCIUM 8.3*  --  8.0*  MG  --  1.6*  --   PHOS  --  3.3  --    Sepsis Markers  Recent Labs Lab 12/21/14 0255  LATICACIDVEN 0.9   ABG  Recent Labs Lab 12/21/14 1005  PHART 7.437  PCO2ART 36.9  PO2ART 106*   Liver Enzymes  Recent Labs Lab 12/21/14 0255 12/22/14 0316  AST 17 23  ALT 13* 11*  ALKPHOS 60 51  BILITOT 0.5 0.9  ALBUMIN 3.0* 2.6*   Cardiac Enzymes  Recent Labs Lab 12/21/14 2020 12/22/14 0316 12/22/14 1129  TROPONINI 0.06* 0.08* 0.09*   Glucose No results for input(s): GLUCAP in the last 168 hours.  Imaging Ct Abdomen Wo Contrast  12/21/2014  CLINICAL DATA:  TPA.  Concern for bleeding. EXAM: CT CHEST, ABDOMEN AND PELVIS WITHOUT CONTRAST TECHNIQUE: Multidetector CT imaging of the chest, abdomen and pelvis was performed  following the standard protocol without IV contrast. COMPARISON:  None. FINDINGS: CT CHEST FINDINGS There is confluent consolidation with air bronchograms in the posterior lower lobes. This could represent infectious infiltrate. Ground-glass opacities are scattered elsewhere, and there also is interlobular septal thickening. This could represent interstitial and alveolar edema. Trace pleural effusions. Central airways are patent. No mediastinal hemorrhage. CT ABDOMEN AND PELVIS FINDINGS No peritoneal blood or free air. There are unremarkable unenhanced appearances of the liver, spleen, pancreas, adrenals and kidneys. There is contrast material in the renal collecting systems and ureters due to earlier contrast administration. The abdominal aorta is normal in caliber with moderate atherosclerotic calcification. Bowel is unremarkable. IMPRESSION: No evidence of significant hemorrhage within the chest, abdomen or pelvis. Confluent airspace consolidation in the posterior lower lobes. This could represent infectious infiltrate. Interstitial and alveolar edema is probably pulse present bilaterally. Electronically Signed   By: Andreas Newport M.D.   On: 12/21/2014 03:53   Ct Head Wo Contrast  12/22/2014  CLINICAL DATA:  Follow-up acute stroke. History of hypertension, hyperlipidemia, colon cancer. EXAM: CT HEAD WITHOUT CONTRAST TECHNIQUE: Contiguous axial images were obtained from the base of the skull through the vertex without intravenous contrast. COMPARISON:  MRI of the brain December 21, 2014 FINDINGS: No intraparenchymal hemorrhage, mass effect, midline shift or acute large vascular territory infarct. Patient's known acute LEFT corona radiata infarct is confluent with supratentorial white matter hypodensities. RIGHT frontal infarct extending to the insula and operculum is unchanged with mild ex vacuo dilatation RIGHT lateral ventricle. Moderate to severe ventriculomegaly with disproportionate sulcal effacement at  the convexities. Old LEFT basal ganglia infarcts. No abnormal extra-axial fluid collections. Basal cisterns are patent. Status post bilateral ocular lens implants. Poor dentition partially imaged. Paranasal sinus mucosal thickening without air-fluid levels. The mastoid air cells are well aerated. No skull fracture. Small vertex scalp hematoma. IMPRESSION: Patient's known acute LEFT corona radiata infarct is confluent with chronic small vessel ischemic disease and not discretely identified. No hemorrhagic conversion. Chronic change including old RIGHT MCA territory infarct and LEFT basal ganglia lacunar infarcts. Findings of normal pressure hydrocephalus are similar. Electronically Signed   By: Elon Alas M.D.   On: 12/22/2014 02:53   Ct Head Wo Contrast  12/21/2014  CLINICAL DATA:  Follow-up code stroke, altered mental status. Acute onset RIGHT-sided weakness and aphasia. History of occluded RIGHT internal carotid artery and 90% stenosis LEFT internal carotid artery. EXAM: CT HEAD WITHOUT CONTRAST TECHNIQUE: Contiguous axial images were obtained from the base of the skull through the vertex without intravenous contrast. COMPARISON:  CT head December 20, 2014 FINDINGS: Small amount of intravascular contrast compatible with recent procedure. No intraparenchymal hemorrhage, mass effect, midline shift. RIGHT frontoparietal encephalomalacia with extension to RIGHT basal ganglia. General preservation of gray-white matter differentiation. Moderate  to severe ventriculomegaly, with disproportionate sulcal effacement at the convexities, unchanged. Confluent supratentorial white matter hypodensities are similar compatible with chronic small vessel ischemic disease. Old LEFT basal ganglia lacunar infarcts. RIGHT cerebral peduncle volume loss compatible with wallerian degeneration. No abnormal extra-axial fluid collection. Basal cisterns are patent. Status post bilateral ocular lens implants. Poor dentition with RIGHT  maxillary dental carie. Mild to moderate paranasal sinusitis. The mastoid air cells are well aerated. No skull fracture. Life-support lines in place. IMPRESSION: No acute intracranial process.  Old RIGHT Chronic changes including old RIGHT MCA territory infarct and old LEFT basal ganglia lacunar infarcts. Findings of normal pressure hydrocephalus are similar. Electronically Signed   By: Elon Alas M.D.   On: 12/21/2014 03:37   Ct Chest Wo Contrast  12/21/2014  CLINICAL DATA:  TPA.  Concern for bleeding. EXAM: CT CHEST, ABDOMEN AND PELVIS WITHOUT CONTRAST TECHNIQUE: Multidetector CT imaging of the chest, abdomen and pelvis was performed following the standard protocol without IV contrast. COMPARISON:  None. FINDINGS: CT CHEST FINDINGS There is confluent consolidation with air bronchograms in the posterior lower lobes. This could represent infectious infiltrate. Ground-glass opacities are scattered elsewhere, and there also is interlobular septal thickening. This could represent interstitial and alveolar edema. Trace pleural effusions. Central airways are patent. No mediastinal hemorrhage. CT ABDOMEN AND PELVIS FINDINGS No peritoneal blood or free air. There are unremarkable unenhanced appearances of the liver, spleen, pancreas, adrenals and kidneys. There is contrast material in the renal collecting systems and ureters due to earlier contrast administration. The abdominal aorta is normal in caliber with moderate atherosclerotic calcification. Bowel is unremarkable. IMPRESSION: No evidence of significant hemorrhage within the chest, abdomen or pelvis. Confluent airspace consolidation in the posterior lower lobes. This could represent infectious infiltrate. Interstitial and alveolar edema is probably pulse present bilaterally. Electronically Signed   By: Andreas Newport M.D.   On: 12/21/2014 03:53   Mr Brain Wo Contrast  12/21/2014  CLINICAL DATA:  81 year old male status post endovascular treatment of  code stroke at 0145 hours today. Initial encounter. EXAM: MRI HEAD WITHOUT CONTRAST TECHNIQUE: Multiplanar, multiecho pulse sequences of the brain and surrounding structures were obtained without intravenous contrast. COMPARISON:  Head CT 0414 hours today. Head CT 12/20/2014. Brain MRI 03/05/2014. FINDINGS: Major intracranial vascular flow voids are stable, chronic occlusion of the right ICA siphon. Flow in the left siphon appears stable. Dominant distal left vertebral artery, proximal posterior circulation appears stable. 2.5 cm area of restricted diffusion in the left corona radiata (series 3 image 32.). T2 and FLAIR hyperintensity. No associated hemorrhage or mass effect. Mildly heterogeneous diffusion signal elsewhere, no definite additional restricted diffusion. Chronic right MCA encephalomalacia. Chronic cerebral volume loss. Chronic Patchy and confluent bilateral cerebral white matter T2 and FLAIR hyperintensity. Chronic lacunar infarcts in the basal ganglia. Outside of the acute findings, no new signal abnormality. Occasional chronic micro hemorrhages. No midline shift, mass effect, evidence of mass lesion, ventriculomegaly, extra-axial collection or acute intracranial hemorrhage. Cervicomedullary junction and pituitary are within normal limits. Negative visualized cervical spine. Fluid in the pharynx. Trace left mastoid fluid is not significantly changed. Paranasal sinus mucosal thickening and small fluid levels. Stable orbits soft tissues. Scalp hematoma at the vertex (series 5, image 13). Visualized bone marrow signal is within normal limits. IMPRESSION: 1. Left corona radiata 2.5 cm infarct without hemorrhage or mass effect. 2. No other acute intracranial abnormality. Chronic ischemic disease. Chronic right ICA occlusion. 3. Vertex scalp hematoma. Electronically Signed   By: Lemmie Evens  [place] Crane M.D.   On: 12/21/2014 14:59   Dg Chest Port 1 View  12/22/2014  CLINICAL DATA:  Aspiration pneumonia EXAM: PORTABLE  CHEST 1 VIEW COMPARISON:  CT scan of the chest and chest x-ray of December 21, 2014 FINDINGS: The lung volumes remain low. The interstitial markings remain increased. There is no pleural effusion or pneumothorax. The cardiac silhouette remains enlarged. There are stable post CABG changes. The pulmonary vascularity remains engorged but is less conspicuous today. Patchy alveolar opacities are also less conspicuous. The endotracheal tube tip projects 4.4 cm above the carina. The PICC line on the adenoma to some blood IMPRESSION: Bilateral pulmonary hypoinflation with persistently increased interstitial infiltrates or edema exhibiting some improvement. There is is decreased pulmonary vascular prominence. Stable cardiomegaly. The support tubes are in reasonable position. Electronically Signed   By: David  Martinique M.D.   On: 12/22/2014 07:45   Dg Chest Port 1 View  12/21/2014  CLINICAL DATA:  Respiratory failure EXAM: PORTABLE CHEST 1 VIEW COMPARISON:  12/20/2014 FINDINGS: Endotracheal tube tip is just below the clavicular heads. Mild central vascular prominence, unchanged. No dense airspace consolidation. No large effusion. No pneumothorax. IMPRESSION: Satisfactory ET tube position. No other significant interval change. Electronically Signed   By: Andreas Newport M.D.   On: 12/21/2014 02:55   Ir Angio Intra Extracran Sel Internal Carotid Uni L Mod Sed  12/22/2014  CLINICAL DATA:  Aphasia.  Left-sided gaze preference. EXAM: IR ANGIO INTRA EXTRACRAN SEL INTERNAL CAROTID UNI LEFT MOD SED LEFT COMMON CAROTID ARTERIOGRAM AND LEFT VERTEBRAL ARTERY ANGIOGRAM AND THE RIGHT COMMON CAROTID ARTERY: ANESTHESIA/SEDATION: Conscious sedation. MEDICATIONS: As per general anesthesia. CONTRAST:  40 mL OMNIPAQUE IOHEXOL 300 MG/ML  SOLN PROCEDURE: Following a full explanation of the procedure along with the potential associated complications, an informed witnessed consent was obtained. The right groin was prepped and draped in the  usual sterile fashion. Thereafter using modified Seldinger technique, transfemoral access into the right common femoral artery was obtained without difficulty. Over a 0.035 inch guidewire, a 5 French Pinnacle sheath was inserted. Through this, and also over a 0.035 inch guidewire a 5 French JB1 catheter was advanced to the aortic arch region and selectively positioned in the left common carotid artery. An arteriogram was then performed proximally and also intracranially. COMPLICATIONS: None immediate. FINDINGS: The left common carotid arteriogram demonstrates early anastomosis from the proximal one third left common carotid artery to opacify a dominant left vertebral artery. The vertebral artery is seen to opacify to the cranial skull base. There is wide patency of the left posterior inferior cerebellar artery and left vertebrobasilar junction. The visualized portion of the basilar artery, the posterior cerebral arteries, and the superior cerebellar arteries is normal into the capillary and the venous phases. The left common carotid bifurcation demonstrates mild caliber irregularity associated with a stent which extends into the proximal left internal carotid artery. There is a focal area of mild narrowing in the distal one third of the stented segment. Nonvisualization of the left external carotid artery suggests complete occlusion. Surgical clips are seen in the region of the left common carotid artery, left vertebral artery anastomosis. However there is a partial retrograde opacification of the left external carotid artery circulation from the left distal vertebral artery musculoskeletal branches with retrograde flow into the left occipital artery. More distally, the left internal carotid artery is seen to opacify to the cranial skull base. The petrous, the cavernous and the supraclinoid segments are widely patent. A prominent left posterior communicating  artery is seen opacifying the left posterior cerebral  artery distribution. The left middle and the left anterior cerebral arteries opacify normally into the capillary and the venous phases. No angiographic evidence of occlusions, stenosis, intracranial filling defects is noted on multiple magnified projections. Also demonstrated is prompt opacification of the right anterior cerebral artery distal to the A2 segment via the anterior communicating artery. Left common carotid arteriogram demonstrates nonvisualization of the right internal carotid artery. The right external carotid artery branches and its origin are widely patent. IMPRESSION: Angiographically no evidence of intracranial filling defects, occlusions, stenoses, dissections or aneurysms noted on the images. Surgical anastomosis between the proximal left common carotid artery and the dominant left vertebral artery. Mild intrastent stenosis of the left internal carotid artery as described above. Angiographically occluded right internal carotid artery and the left external carotid artery. Electronically Signed   By: Luanne Bras M.D.   On: 12/22/2014 08:54   Ir Angio Vertebral Sel Subclavian Innominate Bilat Mod Sed  12/22/2014  CLINICAL DATA:  Aphasia.  Left-sided gaze preference. EXAM: IR ANGIO INTRA EXTRACRAN SEL INTERNAL CAROTID UNI LEFT MOD SED LEFT COMMON CAROTID ARTERIOGRAM AND LEFT VERTEBRAL ARTERY ANGIOGRAM AND THE RIGHT COMMON CAROTID ARTERY: ANESTHESIA/SEDATION: Conscious sedation. MEDICATIONS: As per general anesthesia. CONTRAST:  40 mL OMNIPAQUE IOHEXOL 300 MG/ML  SOLN PROCEDURE: Following a full explanation of the procedure along with the potential associated complications, an informed witnessed consent was obtained. The right groin was prepped and draped in the usual sterile fashion. Thereafter using modified Seldinger technique, transfemoral access into the right common femoral artery was obtained without difficulty. Over a 0.035 inch guidewire, a 5 French Pinnacle sheath was inserted.  Through this, and also over a 0.035 inch guidewire a 5 French JB1 catheter was advanced to the aortic arch region and selectively positioned in the left common carotid artery. An arteriogram was then performed proximally and also intracranially. COMPLICATIONS: None immediate. FINDINGS: The left common carotid arteriogram demonstrates early anastomosis from the proximal one third left common carotid artery to opacify a dominant left vertebral artery. The vertebral artery is seen to opacify to the cranial skull base. There is wide patency of the left posterior inferior cerebellar artery and left vertebrobasilar junction. The visualized portion of the basilar artery, the posterior cerebral arteries, and the superior cerebellar arteries is normal into the capillary and the venous phases. The left common carotid bifurcation demonstrates mild caliber irregularity associated with a stent which extends into the proximal left internal carotid artery. There is a focal area of mild narrowing in the distal one third of the stented segment. Nonvisualization of the left external carotid artery suggests complete occlusion. Surgical clips are seen in the region of the left common carotid artery, left vertebral artery anastomosis. However there is a partial retrograde opacification of the left external carotid artery circulation from the left distal vertebral artery musculoskeletal branches with retrograde flow into the left occipital artery. More distally, the left internal carotid artery is seen to opacify to the cranial skull base. The petrous, the cavernous and the supraclinoid segments are widely patent. A prominent left posterior communicating artery is seen opacifying the left posterior cerebral artery distribution. The left middle and the left anterior cerebral arteries opacify normally into the capillary and the venous phases. No angiographic evidence of occlusions, stenosis, intracranial filling defects is noted on  multiple magnified projections. Also demonstrated is prompt opacification of the right anterior cerebral artery distal to the A2 segment via the anterior communicating artery.  Left common carotid arteriogram demonstrates nonvisualization of the right internal carotid artery. The right external carotid artery branches and its origin are widely patent. IMPRESSION: Angiographically no evidence of intracranial filling defects, occlusions, stenoses, dissections or aneurysms noted on the images. Surgical anastomosis between the proximal left common carotid artery and the dominant left vertebral artery. Mild intrastent stenosis of the left internal carotid artery as described above. Angiographically occluded right internal carotid artery and the left external carotid artery. Electronically Signed   By: Luanne Bras M.D.   On: 12/22/2014 08:54    Labwork from [place].  Na 141 K 3.9 Cl 102 Co2 30.1 BUN 25 Cr 1.83 Glu 136 Ca 8.7   WBC 8.3 HgB 13.4 Hct 43.3 PLT 141  ASSESSMENT / PLAN: 81 year old male with left corona radiata infarct and altered mentation requiring intubation for airway protection. Likely has some element of aspiration given mild increase in secretions. Wife updated at bedside this morning. Patient did have some transient hypotension overnight.   1. Left corona radiata CVA: Management per neurology. Aspirin & Lipitor. 2. Acute hypoxic respiratory failure: Likely secured to aspiration. Continuing to wean FiO2 & PEEP to maintain saturation greater than 94%. Plan for SBT once patient's mental status improves. 3. Altered mental status: Likely secondary to CVA.  4. Aspiration pneumonia: Continuing on Unasyn day #2. Tracheal aspirate culture pending. 5. Hypertension: Plan to initiate Cardene drip for systolic blood pressure greater than 180 mmHg.  6. Hyperlipidemia: Started on Lipitor by neurology service. 7. H/O CAD/ICM: Echocardiogram pending. Started on  Lipitor.  8. Diet: Nothing by mouth. 9. Prophylaxis: SCDs, Protonix IV daily, & Lovenox subcutaneous daily.   I have spent a total of 33 minutes of critical care time today caring for the patient, updating the patient's wife at bedside, & reviewing the patient's electronic medical record.  Sonia Baller Ashok Cordia, M.D. [facility] Pulmonary & Critical Care Pager:  [phone] After 3pm or if no response, call [phone] 12/22/2014, 1:21 PM

## 2014-12-22 NOTE — Progress Notes (Signed)
PT Cancellation Note  Patient Details Name: Darius Barber MRN: [id number] DOB: 1931-07-03   Cancelled Treatment:    Reason Eval/Treat Not Completed: Patient not medically ready.  Pt remains on strict bedrest.  Please advance activity order when appropriate for PT and mobility.     Catelyn Friel, Thornton Papas 12/22/2014, 8:21 AM

## 2014-12-22 NOTE — Progress Notes (Signed)
  Echocardiogram 2D Echocardiogram has been performed.  Jennette Dubin 12/22/2014, 10:51 AM

## 2014-12-23 ENCOUNTER — Encounter (HOSPITAL_COMMUNITY): Payer: Self-pay | Admitting: Student

## 2014-12-23 DIAGNOSIS — J9600 Acute respiratory failure, unspecified whether with hypoxia or hypercapnia: Secondary | ICD-10-CM

## 2014-12-23 DIAGNOSIS — R748 Abnormal levels of other serum enzymes: Secondary | ICD-10-CM

## 2014-12-23 DIAGNOSIS — I25709 Atherosclerosis of coronary artery bypass graft(s), unspecified, with unspecified angina pectoris: Secondary | ICD-10-CM

## 2014-12-23 DIAGNOSIS — E785 Hyperlipidemia, unspecified: Secondary | ICD-10-CM

## 2014-12-23 DIAGNOSIS — J189 Pneumonia, unspecified organism: Secondary | ICD-10-CM

## 2014-12-23 LAB — CBC WITH DIFFERENTIAL/PLATELET
Basophils Absolute: 0 10*3/uL (ref 0.0–0.1)
Basophils Relative: 0 %
EOS ABS: 0.1 10*3/uL (ref 0.0–0.7)
EOS PCT: 0 %
HCT: 36.9 % — ABNORMAL LOW (ref 39.0–52.0)
Hemoglobin: 11.3 g/dL — ABNORMAL LOW (ref 13.0–17.0)
LYMPHS ABS: 0.7 10*3/uL (ref 0.7–4.0)
LYMPHS PCT: 6 %
MCH: 27.6 pg (ref 26.0–34.0)
MCHC: 30.6 g/dL (ref 30.0–36.0)
MCV: 90.2 fL (ref 78.0–100.0)
MONO ABS: 1.8 10*3/uL — AB (ref 0.1–1.0)
MONOS PCT: 13 %
Neutro Abs: 10.5 10*3/uL — ABNORMAL HIGH (ref 1.7–7.7)
Neutrophils Relative %: 81 %
PLATELETS: 134 10*3/uL — AB (ref 150–400)
RBC: 4.09 MIL/uL — AB (ref 4.22–5.81)
RDW: 16.1 % — ABNORMAL HIGH (ref 11.5–15.5)
WBC: 13.1 10*3/uL — AB (ref 4.0–10.5)

## 2014-12-23 LAB — GLUCOSE, CAPILLARY
Glucose-Capillary: 108 mg/dL — ABNORMAL HIGH (ref 65–99)
Glucose-Capillary: 134 mg/dL — ABNORMAL HIGH (ref 65–99)
Glucose-Capillary: 121 mg/dL — ABNORMAL HIGH (ref 65–99)

## 2014-12-23 LAB — RENAL FUNCTION PANEL
ALBUMIN: 2.6 g/dL — AB (ref 3.5–5.0)
Anion gap: 9 (ref 5–15)
BUN: 25 mg/dL — AB (ref 6–20)
CALCIUM: 8.5 mg/dL — AB (ref 8.9–10.3)
CO2: 24 mmol/L (ref 22–32)
CREATININE: 1.91 mg/dL — AB (ref 0.61–1.24)
Chloride: 110 mmol/L (ref 101–111)
GFR, EST AFRICAN AMERICAN: 36 mL/min — AB (ref >60)
GFR, EST NON AFRICAN AMERICAN: 31 mL/min — AB (ref >60)
Glucose, Bld: 120 mg/dL — ABNORMAL HIGH (ref 65–99)
PHOSPHORUS: 2.5 mg/dL (ref 2.5–4.6)
Potassium: 4 mmol/L (ref 3.5–5.1)
Sodium: 143 mmol/L (ref 135–145)

## 2014-12-23 LAB — TROPONIN I
TROPONIN I: 1.74 ng/mL — AB (ref <0.031)
TROPONIN I: 1.88 ng/mL — AB (ref <0.031)
Troponin I: 2.32 ng/mL (ref <0.031)
Troponin I: 1.84 ng/mL (ref <0.031)

## 2014-12-23 LAB — BASIC METABOLIC PANEL
Anion gap: 13 (ref 5–15)
BUN: 24 mg/dL — AB (ref 6–20)
CHLORIDE: 108 mmol/L (ref 101–111)
CO2: 23 mmol/L (ref 22–32)
Calcium: 8.5 mg/dL — ABNORMAL LOW (ref 8.9–10.3)
Creatinine, Ser: 1.88 mg/dL — ABNORMAL HIGH (ref 0.61–1.24)
GFR calc Af Amer: 37 mL/min — ABNORMAL LOW (ref >60)
GFR calc non Af Amer: 32 mL/min — ABNORMAL LOW (ref >60)
GLUCOSE: 116 mg/dL — AB (ref 65–99)
POTASSIUM: 3.6 mmol/L (ref 3.5–5.1)
SODIUM: 144 mmol/L (ref 135–145)

## 2014-12-23 LAB — CULTURE, RESPIRATORY

## 2014-12-23 LAB — MAGNESIUM: MAGNESIUM: 1.5 mg/dL — AB (ref 1.7–2.4)

## 2014-12-23 MED ORDER — ATORVASTATIN CALCIUM 80 MG PO TABS
80.0000 mg | ORAL_TABLET | Freq: Every day | ORAL | Status: DC
  Administered 2014-12-23 – 2015-01-14 (×21): 80 mg
  Filled 2014-12-23 (×22): qty 1

## 2014-12-23 MED ORDER — JEVITY 1.2 CAL PO LIQD
1000.0000 mL | ORAL | Status: DC
  Administered 2014-12-23 – 2014-12-29 (×7): 1000 mL
  Filled 2014-12-23 (×9): qty 1000

## 2014-12-23 MED ORDER — ALBUTEROL SULFATE (2.5 MG/3ML) 0.083% IN NEBU
2.5000 mg | INHALATION_SOLUTION | Freq: Four times a day (QID) | RESPIRATORY_TRACT | Status: DC
  Administered 2014-12-23 – 2015-01-08 (×64): 2.5 mg via RESPIRATORY_TRACT
  Filled 2014-12-23 (×64): qty 3

## 2014-12-23 MED ORDER — LEVOTHYROXINE SODIUM 100 MCG IV SOLR
75.0000 ug | Freq: Every day | INTRAVENOUS | Status: DC
  Administered 2014-12-23: 75 ug via INTRAVENOUS
  Filled 2014-12-23: qty 5

## 2014-12-23 MED ORDER — MAGNESIUM SULFATE 2 GM/50ML IV SOLN
2.0000 g | Freq: Once | INTRAVENOUS | Status: AC
  Administered 2014-12-23: 2 g via INTRAVENOUS
  Filled 2014-12-23: qty 50

## 2014-12-23 MED ORDER — PRO-STAT SUGAR FREE PO LIQD
30.0000 mL | Freq: Three times a day (TID) | ORAL | Status: DC
Start: 2014-12-23 — End: 2014-12-29
  Administered 2014-12-23 – 2014-12-29 (×19): 30 mL
  Filled 2014-12-23 (×19): qty 30

## 2014-12-23 NOTE — Progress Notes (Signed)
OT Cancellation Note  Patient Details Name: Darius Barber MRN: [id number] DOB: 1931/03/02   Cancelled Treatment:    Reason Eval/Treat Not Completed: Patient not medically ready (strict bedrest order set) please increase activities as appropriate.  Peri Maris  [phone]  12/23/2014, 6:40 AM

## 2014-12-23 NOTE — Progress Notes (Signed)
PT Cancellation Note  Patient Details Name: Darius Barber MRN: [id number] DOB: [birth year]   Cancelled Treatment:    Reason Eval/Treat Not Completed: Patient not medically ready.  Pt continues to be on strict bedrest.  Please advance activity order when appropriate for PT and mobility.     Lyric Rossano, Thornton Papas 12/23/2014, 8:51 AM

## 2014-12-23 NOTE — Consult Note (Signed)
CARDIOLOGY CONSULT NOTE   Patient ID: MARQUIZE SEIB MRN: [id number], DOB/AGE: 1931-03-22   Admit date: 12/21/2014 Date of Consult: 12/23/2014 Reason for  Consult: Elevated Troponin   Primary Physician: Curlene Labrum, MD Primary Cardiologist: Dr. Ron Parker - [place] (Dr. Gwenlyn Found)  HPI: RAYDAN SCHLABACH is a 81 y.o. male with past medical history of CAD (s/p CABG 2006 LIMA-LAD, SVG-1D, SVG-OM1, SVG-PDA), Carotid Artery Occlusion (s/p stent 2015), HTN, HLD, and CVA who presented to [facility] as a transfer from [facility] on 12/21/2014 for further management of likely acute CVA. Given TPA. Later developed AMS and was urgently intubated.   The patient is currently intubated and sedated and unable to provide history. No family or friends are present during the encounter.   MRI of the brain on 12/21/2014 showed a left corona radiata 2.5 cm infarct without hemorrhage or mass effect. An echocardiogram was obtained on 12/22/2014 which showed EF of 55% to 60% with elevated PA peak pressure: 65 mm Hg (S).  Cardiology was asked to see for patient for elevated troponin. Cyclic troponin values have been 0.08, 0.09, 2.32, and 1.74. EKG shows NSR with rate in 50's. No acute ST or T wave changes to suggest ischemia. There is no mention of the patient having chest pain in medical records this admission.  Appears his last NST was in 2010 and showed no evidence of ischemia at that time. No cardiac catheterization since prior to CAGB in 2006.   Problem List Past Medical History  Diagnosis Date  . Hyperlipidemia   . Colon cancer ([place])   . Essential hypertension   . Diverticulosis   . Coronary artery disease     Multivessel status post CABG 2006   . Carotid artery occlusion     Occluded RICA, remote left CEA, left carotid stent July 2015 - Dr. Trula Slade  . Hypothyroidism   . PAD (peripheral artery disease) ([place])   . Renal insufficiency, mild   . History of stroke     Past Surgical History    Procedure Laterality Date  . Coronary artery bypass graft  Oct 2006    [facility] - LIMA to LAD, SVG to D1, SVG to OM1, SVG to PDA  . Hemorrhoid surgery    . Hemicolectomy  2007    Dr.Matt Tsuei  . Carotid endarterectomy    . Arch aortogram N/A 08/06/2013    Procedure: ARCH AORTOGRAM;  Surgeon: Serafina Mitchell, MD;  Location: [facility] CATH LAB;  Service: Cardiovascular;  Laterality: N/A;  . Carotid angiogram N/A 08/06/2013    Procedure: CAROTID ANGIOGRAM;  Surgeon: Serafina Mitchell, MD;  Location: [facility] CATH LAB;  Service: Cardiovascular;  Laterality: N/A;  . Carotid stent insertion Left 08/20/2013    Procedure: CAROTID STENT INSERTION;  Surgeon: Serafina Mitchell, MD;  Location: [facility] CATH LAB;  Service: Cardiovascular;  Laterality: Left;  . Radiology with anesthesia N/A 12/21/2014    Procedure: RADIOLOGY WITH ANESTHESIA;  Surgeon: Luanne Bras, MD;  Location: [place];  Service: Radiology;  Laterality: N/A;     Allergies Allergies  Allergen Reactions  . Simvastatin     REACTION: muscle aches      Inpatient Medications .  stroke: mapping our early stages of recovery book   Does not apply Once  . albuterol  2.5 mg Nebulization Q6H  . ampicillin-sulbactam (UNASYN) IV  3 g Intravenous Q8H  . antiseptic oral rinse  7 mL Mouth Rinse 10 times per day  . aspirin  81  mg Oral Daily  . atorvastatin  80 mg Per Tube q1800  . chlorhexidine gluconate  15 mL Mouth Rinse BID  . clopidogrel  75 mg Oral Daily  . enoxaparin (LOVENOX) injection  40 mg Subcutaneous Q24H  . feeding supplement (PRO-STAT SUGAR FREE 64)  30 mL Per Tube TID  . levothyroxine  75 mcg Intravenous Daily  . pantoprazole (PROTONIX) IV  40 mg Intravenous QHS    Family History No family history on file.   Social History Social History   Social History  . Marital Status: Married    Spouse Name: N/A  . Number of Children: N/A  . Years of Education: N/A   Occupational History  . Not on file.   Social History Main Topics  .  Smoking status: Former Smoker    Types: Cigarettes    Quit date: 10/24/1979  . Smokeless tobacco: Never Used  . Alcohol Use: No  . Drug Use: No  . Sexual Activity: Not on file   Other Topics Concern  . Not on file   Social History Narrative     Review of Systems Unable to be obtained due to AMS and current intubation.  Physical Exam Blood pressure 149/79, pulse 70, temperature 100 F (37.8 C), temperature source Axillary, resp. rate 19, height 5\' 9"  (1.753 m), SpO2 95 %.  General: Elderly Caucasian male, currently intubated.  Neuro: Alert and oriented X 3. Moves all extremities spontaneously. HEENT: Normal  Neck: Supple without bruits or JVD. Lungs:  Resp regular and unlabored, CTA without wheezing or rales. Heart: RRR no s3, s4, or murmurs. Abdomen: Soft, non-tender, non-distended, BS + x 4.  Extremities: No clubbing, cyanosis or edema. DP/PT/Radials 2+ and equal bilaterally.  Labs   Recent Labs  12/22/14 0316 12/22/14 1129 12/23/14 0250 12/23/14 0750  TROPONINI 0.08* 0.09* 2.32* 1.74*   Lab Results  Component Value Date   WBC 13.1* 12/23/2014   HGB 11.3* 12/23/2014   HCT 36.9* 12/23/2014   MCV 90.2 12/23/2014   PLT 134* 12/23/2014    Recent Labs Lab 12/22/14 0316  12/23/14 0750  NA 141  < > 144  K 3.8  < > 3.6  CL 110  < > 108  CO2 25  < > 23  BUN 21*  < > 24*  CREATININE 1.75*  < > 1.88*  CALCIUM 8.0*  < > 8.5*  PROT 5.2*  --   --   BILITOT 0.9  --   --   ALKPHOS 51  --   --   ALT 11*  --   --   AST 23  --   --   GLUCOSE 131*  < > 116*  < > = values in this interval not displayed. Lab Results  Component Value Date   CHOL 129 12/21/2014   HDL 27* 12/21/2014   LDLCALC 79 12/21/2014   TRIG 74 12/21/2014    Radiology/Studies Ct Head Wo Contrast: 12/22/2014  CLINICAL DATA:  Follow-up acute stroke. History of hypertension, hyperlipidemia, colon cancer. EXAM: CT HEAD WITHOUT CONTRAST TECHNIQUE: Contiguous axial images were obtained from the base  of the skull through the vertex without intravenous contrast. COMPARISON:  MRI of the brain December 21, 2014 FINDINGS: No intraparenchymal hemorrhage, mass effect, midline shift or acute large vascular territory infarct. Patient's known acute LEFT corona radiata infarct is confluent with supratentorial white matter hypodensities. RIGHT frontal infarct extending to the insula and operculum is unchanged with mild ex vacuo dilatation RIGHT lateral ventricle. Moderate  to severe ventriculomegaly with disproportionate sulcal effacement at the convexities. Old LEFT basal ganglia infarcts. No abnormal extra-axial fluid collections. Basal cisterns are patent. Status post bilateral ocular lens implants. Poor dentition partially imaged. Paranasal sinus mucosal thickening without air-fluid levels. The mastoid air cells are well aerated. No skull fracture. Small vertex scalp hematoma. IMPRESSION: Patient's known acute LEFT corona radiata infarct is confluent with chronic small vessel ischemic disease and not discretely identified. No hemorrhagic conversion. Chronic change including old RIGHT MCA territory infarct and LEFT basal ganglia lacunar infarcts. Findings of normal pressure hydrocephalus are similar. Electronically Signed   By: Elon Alas M.D.   On: 12/22/2014 02:53   Mr Brain Wo Contrast: 12/21/2014  CLINICAL DATA:  81 year old male status post endovascular treatment of code stroke at 0145 hours today. Initial encounter. EXAM: MRI HEAD WITHOUT CONTRAST TECHNIQUE: Multiplanar, multiecho pulse sequences of the brain and surrounding structures were obtained without intravenous contrast. COMPARISON:  Head CT 0414 hours today. Head CT 12/20/2014. Brain MRI 03/05/2014. FINDINGS: Major intracranial vascular flow voids are stable, chronic occlusion of the right ICA siphon. Flow in the left siphon appears stable. Dominant distal left vertebral artery, proximal posterior circulation appears stable. 2.5 cm area of  restricted diffusion in the left corona radiata (series 3 image 32.). T2 and FLAIR hyperintensity. No associated hemorrhage or mass effect. Mildly heterogeneous diffusion signal elsewhere, no definite additional restricted diffusion. Chronic right MCA encephalomalacia. Chronic cerebral volume loss. Chronic Patchy and confluent bilateral cerebral white matter T2 and FLAIR hyperintensity. Chronic lacunar infarcts in the basal ganglia. Outside of the acute findings, no new signal abnormality. Occasional chronic micro hemorrhages. No midline shift, mass effect, evidence of mass lesion, ventriculomegaly, extra-axial collection or acute intracranial hemorrhage. Cervicomedullary junction and pituitary are within normal limits. Negative visualized cervical spine. Fluid in the pharynx. Trace left mastoid fluid is not significantly changed. Paranasal sinus mucosal thickening and small fluid levels. Stable orbits soft tissues. Scalp hematoma at the vertex (series 5, image 13). Visualized bone marrow signal is within normal limits. IMPRESSION: 1. Left corona radiata 2.5 cm infarct without hemorrhage or mass effect. 2. No other acute intracranial abnormality. Chronic ischemic disease. Chronic right ICA occlusion. 3. Vertex scalp hematoma. Electronically Signed   By: Genevie Ann M.D.   On: 12/21/2014 14:59   Dg Chest Port 1 View: 12/22/2014  CLINICAL DATA:  Aspiration pneumonia EXAM: PORTABLE CHEST 1 VIEW COMPARISON:  CT scan of the chest and chest x-ray of December 21, 2014 FINDINGS: The lung volumes remain low. The interstitial markings remain increased. There is no pleural effusion or pneumothorax. The cardiac silhouette remains enlarged. There are stable post CABG changes. The pulmonary vascularity remains engorged but is less conspicuous today. Patchy alveolar opacities are also less conspicuous. The endotracheal tube tip projects 4.4 cm above the carina. The PICC line on the adenoma to some blood IMPRESSION: Bilateral pulmonary  hypoinflation with persistently increased interstitial infiltrates or edema exhibiting some improvement. There is is decreased pulmonary vascular prominence. Stable cardiomegaly. The support tubes are in reasonable position. Electronically Signed   By: David  Martinique M.D.   On: 12/22/2014 07:45  Ir Angio Intra Extracran Sel Internal Carotid Uni L Mod Sed  ECG: NSR with rate in 50's. No acute ST or T wave changes.    ECHOCARDIOGRAM:  Study Conclusions - Left ventricle: The cavity size was normal. Wall thickness was increased in a pattern of mild LVH. Systolic function was normal. The estimated ejection fraction was in the range  of 55% to 60%. - Mitral valve: There was mild regurgitation. - Pulmonary arteries: PA peak pressure: 65 mm Hg (S).  ASSESSMENT AND PLAN  1. Elevated Troponin - history of CAD s/p CABG 2006. Also had known Carotid Artery Occlusion s/p stent in 4431.  -Cyclic troponin values have been 0.08, 0.09, 2.32, and 1.74. Likely due to demand ischemia in setting of acute CVA. - EKG shows no acute ST or T wave changes to suggest ischemia. - continue ASA, statin, and Plavix   2. AMS - currently intubated.  - management per admitting team   3. Aspiration PNA - on Abx treatment - management per primary team  4. HLD - continue statin   5. HTN - BP has been 97/56 - 158/106 in the past 24 hours. - continue to monitor  Signed, Erma Heritage, PA-C 12/23/2014, 2:18 PM Pager: [phone]  Agree with note by Guinevere Scarlet  Pt admitted with CVA. H/O CAD s/p remote CABG 2006 and neg MV 2010. CAS 18 months ago. We are asked to see because of mildly elevated Trop. Pt is intubated and hemodynamically stable. 2D nl. EKG w/o acute changes. Doubt ACS. Elevated trop prob secondary to CVA. Typical peak and decline. Agree with Med Rx with anti platelet Rx, statin, BB etc..Marland KitchenNo further w/u required at this time. Will be avail as needed.   Lorretta Harp, M.D., [place],  [facility], [place] [address]. [place], [place]  [id number]  [phone] 12/23/2014 2:44 PM

## 2014-12-23 NOTE — Evaluation (Signed)
Physical Therapy Evaluation Patient Details Name: Darius Barber MRN: [id number] DOB: 27-Feb-1931 Today's Date: 12/23/2014   History of Present Illness  pt presents with L Corona Radiata Infarct with concern for aspiration causing Hypoxic Respiratory Failure, which required intubation on 11/6.  pt with hx of CAD, CABG, CVA, and Colon CA.  Clinical Impression  Pt currently on vent and RN had decreased sedation, however pt became restless and RN increased sedation for pt's comfort.  Eval limited to chair positioning in bed and extremity assessment.  Pt with increased tone throughout LEs and pain sensation appears intact, though delayed possibly due to sedation.  Pt intermittently follows directions, though difficult to maintain arousal.  At this time pt will need post-acute rehab such as SNF vs CIR.  Will continue to follow.      Follow Up Recommendations SNF    Equipment Recommendations  None recommended by PT    Recommendations for Other Services       Precautions / Restrictions Precautions Precautions: Fall;Other (comment) Precaution Comments: OETT Restrictions Weight Bearing Restrictions: No      Mobility  Bed Mobility               General bed mobility comments: pt placed in chair position in bed with HOB elevated to 45 degrees with VSS.    Transfers                    Ambulation/Gait                Stairs            Wheelchair Mobility    Modified Rankin (Stroke Patients Only) Modified Rankin (Stroke Patients Only) Pre-Morbid Rankin Score: No symptoms Modified Rankin: Severe disability     Balance                                             Pertinent Vitals/Pain Pain Assessment: Faces Faces Pain Scale: Hurts little more Pain Location: pt restless in the bed and RN increased the sedation.   Pain Descriptors / Indicators: Grimacing Pain Intervention(s): Monitored during session;Repositioned;RN gave pain meds during  session    Home Living Family/patient expects to be discharged to:: Unsure                      Prior Function Level of Independence: Independent         Comments: Per family pt was independent and able to drive and perform chores around the house.       Hand Dominance        Extremity/Trunk Assessment   Upper Extremity Assessment: Defer to OT evaluation           Lower Extremity Assessment: RLE deficits/detail;LLE deficits/detail RLE Deficits / Details: Increased extensor tone noted throughout with difficulty bringing ankle to neutral position.  Pain sensation appears intact, but delayed ? due to increased sedation vs true delay.   LLE Deficits / Details: pt not following directions for assessment, but does move L LE as if trying to cross his ankles.  When attempting to assess ROM, unable to flex knee due to ? increased tone vs resisting movement.  Pain sensation seems intact, but also delayed.       Communication   Communication: Other (comment) (OETT)  Cognition Arousal/Alertness: Suspect due to medications (Drowsy once meds increased.)  Behavior During Therapy: Flat affect Overall Cognitive Status: Difficult to assess                      General Comments      Exercises        Assessment/Plan    PT Assessment Patient needs continued PT services  PT Diagnosis Generalized weakness;Hemiplegia dominant side   PT Problem List Decreased strength;Decreased activity tolerance;Decreased balance;Decreased mobility;Decreased coordination;Decreased cognition;Decreased range of motion;Decreased knowledge of use of DME;Decreased safety awareness;Cardiopulmonary status limiting activity;Impaired sensation;Impaired tone  PT Treatment Interventions DME instruction;Gait training;Functional mobility training;Therapeutic activities;Therapeutic exercise;Balance training;Neuromuscular re-education;Cognitive remediation;Patient/family education   PT Goals (Current  goals can be found in the Care Plan section) Acute Rehab PT Goals Patient Stated Goal: pt unable to state. PT Goal Formulation: Patient unable to participate in goal setting Time For Goal Achievement: 01/06/15 Potential to Achieve Goals: Good    Frequency Min 3X/week   Barriers to discharge        Co-evaluation               End of Session Equipment Utilized During Treatment:  (Vent) Activity Tolerance: Patient limited by lethargy Patient left: in bed;with call bell/phone within reach;with family/visitor present (Bed was in chair position) Nurse Communication: Mobility status;Need for lift equipment         Time: 8115-7262 PT Time Calculation (min) (ACUTE ONLY): 17 min   Charges:   PT Evaluation $Initial PT Evaluation Tier I: 1 Procedure     PT G CodesCatarina Hartshorn, [place] 12/23/2014, 11:50 AM

## 2014-12-23 NOTE — Progress Notes (Signed)
Nutrition Follow-up  INTERVENTION:   Initiate Jevity 1.2 @ 30 ml/hr via OG tube, increase by 10 ml every 4 hours to goal rate of 50 ml/hr 30 ml Prostat TID Provides: 1740 kcal, 111 grams protein, and 972 ml H2O TF regimen and propofol at current rate providing 1877 total kcal/day (102 % of kcal needs)  NUTRITION DIAGNOSIS:   Inadequate oral intake related to inability to eat as evidenced by NPO status. Ongoing.   GOAL:   Patient will meet greater than or equal to 90% of their needs Not yet met.   MONITOR:   I & O's, Vent status, Labs, Weight trends, TF tolerance  REASON FOR ASSESSMENT:   Consult Enteral/tube feeding initiation and management  ASSESSMENT:   81 yo male from [place] with altered mental status, Rt sided weakness, aphasia with concern for CVA. S/P tPA and neuro IR. Acute respiratory failure 2nd to aspiration PNA and compromised airway.  Labs reviewed: Magnesium low (1.5) Patient is currently intubated on ventilator support MV: 9.4 L/min Temp (24hrs), Avg:99.2 F (37.3 C), Min:96.9 F (36.1 C), Max:100 F (37.8 C)  Propofol: 5.2 ml/hr provides 137 kcal per day from lipids Pt discussed during ICU rounds and with RN.  Ok to start TF.   Diet Order:  Diet NPO time specified  Skin:  Reviewed, no issues  Last BM:  unknown  Height:   Ht Readings from Last 1 Encounters:  12/21/14 5' 9"  (1.753 m)    Weight:   Wt Readings from Last 1 Encounters:  12/09/14 200 lb (90.719 kg)    Ideal Body Weight:  72.7 kg  BMI:  There is no weight on file to calculate BMI.  Estimated Nutritional Needs:   Kcal:  1840  Protein:  110-120 grams  Fluid:  > 1.9 L/day  EDUCATION NEEDS:   No education needs identified at this time  [place], [place], [place] Pager [phone] After Hours Pager

## 2014-12-23 NOTE — Progress Notes (Signed)
Pt came to IR from [facility] ED for angiogram, no blockage found. Pt arrived just before downtime of the computer system as well as time change. Time change is causing a conflict for actual time of specific procedures completed during this procedure.   Babs Bertin RN

## 2014-12-23 NOTE — Progress Notes (Signed)
PULMONARY  / [place]   Name: Darius Barber MRN: [id number] DOB: 05/13/1931    ADMISSION DATE:  12/21/2014 CONSULTATION DATE: 12/21/2014  REQUESTING CLINICIAN: Dr. Leonie Man PRIMARY SERVICE: Neurology  CHIEF COMPLAINT:  Stroke  BRIEF PATIENT DESCRIPTION: 57 M transferred from [facility] for VIR evaluation following presumed CVA. GCS 15 on arrival to NSICU but developed AMS with GCS of 7. PCCM consulted for intubation and vent management.  SIGNIFICANT EVENTS: 11/6 - Admitted to hospital  STUDIES:  Portable CXR 11/7 - ETT 4.4cm above carina. No focal opacity. CT Head 11/7 - Left corona radiata infarct. No hemorrhage. Old infarcts.  TTE 11/7 - LV normal in size. Mild LVH. EF 55-60%. LA normal in size. RA normal in size. RV normal in size and function. No pericardial effusion.  LINES / TUBES: ETT 11/6  CULTURES: Tracheal Aspirate (11/6):  Oral Flora  ANTIBIOTICS: Unasyn 11/6>>  HISTORY OF PRESENT ILLNESS:  Darius Barber is an 4 M with prior CVA, CAD, carotid stenosis who presented to [facility] [place] today for stroke-like symptoms. Patient is currently altered and unable to provide any history. He went to VIR on arrival with a normal angiogram. Shortly after arrival to NSICU patient became altered with GCS of 7. PCCM called for emergent intubation.   SUBJECTIVE: Patient had increasing secretions requiring suctioning. Additionally his FiO2 had to be increased due to hypoxia.  REVIEW OF SYSTEMS:  Unable to obtain secondary to patient's intubation.  VITAL SIGNS: Temp:  [96.9 F (36.1 C)-100 F (37.8 C)] 100 F (37.8 C) (11/08 0738) Pulse Rate:  [61-91] 67 (11/08 1000) Resp:  [14-26] 16 (11/08 1000) BP: (97-158)/(56-106) 113/60 mmHg (11/08 1000) SpO2:  [91 %-97 %] 95 % (11/08 1000) Arterial Line BP: (76-169)/(53-159) 106/103 mmHg (11/08 1000) FiO2 (%):  [40 %-50 %] 50 % (11/08 1000) HEMODYNAMICS:   VENTILATOR SETTINGS: Vent Mode:  [-] PRVC FiO2  (%):  [40 %-50 %] 50 % Set Rate:  [15 bmp] 15 bmp Vt Set:  [600 mL] 600 mL PEEP:  [8 cmH20] 8 cmH20 Plateau Pressure:  [26 cmH20-30 cmH20] 26 cmH20 INTAKE / OUTPUT: Intake/Output      11/07 0701 - 11/08 0700 11/08 0701 - 11/09 0700   I.V. 637.4 105.4   IV Piggyback  50   Total Intake 637.4 155.4   Urine 1440 100   Total Output 1440 100   Net -802.6 +55.4          PHYSICAL EXAMINATION: General:  Obese male. Sedated. Wife & daughter at bedside.  Integument:  Warm & dry. No rash on exposed skin.  HEENT:  No scleral injection or icterus. Endotracheal tube in place.  Cardiovascular:  Regular rate. No edema.  Pulmonary:  Coarse breath sounds bilaterally. Symmetric chest wall rise on ventilator. Abdomen: Soft. Normal bowel sounds. Protuberant. Neurological: Moving left upper & lower extremities on command. Sedated.   LABS:  CBC  Recent Labs Lab 12/21/14 0255 12/22/14 0316 12/23/14 0250  WBC 7.7 10.8* 13.1*  HGB 12.0* 11.1* 11.3*  HCT 39.3 35.7* 36.9*  PLT 149* 134* 134*   Coag's  Recent Labs Lab 12/21/14 0255  INR 1.18   BMET  Recent Labs Lab 12/22/14 0316 12/23/14 0250 12/23/14 0750  NA 141 143 144  K 3.8 4.0 3.6  CL 110 110 108  CO2 25 24 23   BUN 21* 25* 24*  CREATININE 1.75* 1.91* 1.88*  GLUCOSE 131* 120* 116*   Electrolytes  Recent Labs Lab 12/21/14 0409 12/22/14  3295 12/23/14 0250 12/23/14 0750  CALCIUM  --  8.0* 8.5* 8.5*  MG 1.6*  --  1.5*  --   PHOS 3.3  --  2.5  --    Sepsis Markers  Recent Labs Lab 12/21/14 0255  LATICACIDVEN 0.9   ABG  Recent Labs Lab 12/21/14 1005  PHART 7.437  PCO2ART 36.9  PO2ART 106*   Liver Enzymes  Recent Labs Lab 12/21/14 0255 12/22/14 0316 12/23/14 0250  AST 17 23  --   ALT 13* 11*  --   ALKPHOS 60 51  --   BILITOT 0.5 0.9  --   ALBUMIN 3.0* 2.6* 2.6*   Cardiac Enzymes  Recent Labs Lab 12/22/14 1129 12/23/14 0250 12/23/14 0750  TROPONINI 0.09* 2.32* 1.74*   Glucose No results  for input(s): GLUCAP in the last 168 hours.  Imaging Ct Head Wo Contrast  12/22/2014  CLINICAL DATA:  Follow-up acute stroke. History of hypertension, hyperlipidemia, colon cancer. EXAM: CT HEAD WITHOUT CONTRAST TECHNIQUE: Contiguous axial images were obtained from the base of the skull through the vertex without intravenous contrast. COMPARISON:  MRI of the brain December 21, 2014 FINDINGS: No intraparenchymal hemorrhage, mass effect, midline shift or acute large vascular territory infarct. Patient's known acute LEFT corona radiata infarct is confluent with supratentorial white matter hypodensities. RIGHT frontal infarct extending to the insula and operculum is unchanged with mild ex vacuo dilatation RIGHT lateral ventricle. Moderate to severe ventriculomegaly with disproportionate sulcal effacement at the convexities. Old LEFT basal ganglia infarcts. No abnormal extra-axial fluid collections. Basal cisterns are patent. Status post bilateral ocular lens implants. Poor dentition partially imaged. Paranasal sinus mucosal thickening without air-fluid levels. The mastoid air cells are well aerated. No skull fracture. Small vertex scalp hematoma. IMPRESSION: Patient's known acute LEFT corona radiata infarct is confluent with chronic small vessel ischemic disease and not discretely identified. No hemorrhagic conversion. Chronic change including old RIGHT MCA territory infarct and LEFT basal ganglia lacunar infarcts. Findings of normal pressure hydrocephalus are similar. Electronically Signed   By: Elon Alas M.D.   On: 12/22/2014 02:53   Mr Brain Wo Contrast  12/21/2014  CLINICAL DATA:  81 year old male status post endovascular treatment of code stroke at 0145 hours today. Initial encounter. EXAM: MRI HEAD WITHOUT CONTRAST TECHNIQUE: Multiplanar, multiecho pulse sequences of the brain and surrounding structures were obtained without intravenous contrast. COMPARISON:  Head CT 0414 hours today. Head CT  12/20/2014. Brain MRI 03/05/2014. FINDINGS: Major intracranial vascular flow voids are stable, chronic occlusion of the right ICA siphon. Flow in the left siphon appears stable. Dominant distal left vertebral artery, proximal posterior circulation appears stable. 2.5 cm area of restricted diffusion in the left corona radiata (series 3 image 32.). T2 and FLAIR hyperintensity. No associated hemorrhage or mass effect. Mildly heterogeneous diffusion signal elsewhere, no definite additional restricted diffusion. Chronic right MCA encephalomalacia. Chronic cerebral volume loss. Chronic Patchy and confluent bilateral cerebral white matter T2 and FLAIR hyperintensity. Chronic lacunar infarcts in the basal ganglia. Outside of the acute findings, no new signal abnormality. Occasional chronic micro hemorrhages. No midline shift, mass effect, evidence of mass lesion, ventriculomegaly, extra-axial collection or acute intracranial hemorrhage. Cervicomedullary junction and pituitary are within normal limits. Negative visualized cervical spine. Fluid in the pharynx. Trace left mastoid fluid is not significantly changed. Paranasal sinus mucosal thickening and small fluid levels. Stable orbits soft tissues. Scalp hematoma at the vertex (series 5, image 13). Visualized bone marrow signal is within normal limits. IMPRESSION: 1. Left corona  radiata 2.5 cm infarct without hemorrhage or mass effect. 2. No other acute intracranial abnormality. Chronic ischemic disease. Chronic right ICA occlusion. 3. Vertex scalp hematoma. Electronically Signed   By: Genevie Ann M.D.   On: 12/21/2014 14:59   Dg Chest Port 1 View  12/22/2014  CLINICAL DATA:  Aspiration pneumonia EXAM: PORTABLE CHEST 1 VIEW COMPARISON:  CT scan of the chest and chest x-ray of December 21, 2014 FINDINGS: The lung volumes remain low. The interstitial markings remain increased. There is no pleural effusion or pneumothorax. The cardiac silhouette remains enlarged. There are stable  post CABG changes. The pulmonary vascularity remains engorged but is less conspicuous today. Patchy alveolar opacities are also less conspicuous. The endotracheal tube tip projects 4.4 cm above the carina. The PICC line on the adenoma to some blood IMPRESSION: Bilateral pulmonary hypoinflation with persistently increased interstitial infiltrates or edema exhibiting some improvement. There is is decreased pulmonary vascular prominence. Stable cardiomegaly. The support tubes are in reasonable position. Electronically Signed   By: David  Martinique M.D.   On: 12/22/2014 07:45   Dg Abd Portable 1v  12/22/2014  CLINICAL DATA:  NG tube placement, stroke EXAM: PORTABLE ABDOMEN - 1 VIEW COMPARISON:  12/22/2014 FINDINGS: NG tube extends to the proximal stomach. Side port is in the distal esophagus. Normal bowel gas pattern. Degenerative changes of the spine. Heart is enlarged. Prior median sternotomy noted. IMPRESSION: NG tube tip proximal stomach. Electronically Signed   By: Jerilynn Mages.  Shick M.D.   On: 12/22/2014 17:49    Labwork from [place].  Na 141 K 3.9 Cl 102 Co2 30.1 BUN 25 Cr 1.83 Glu 136 Ca 8.7   WBC 8.3 HgB 13.4 Hct 43.3 PLT 141  ASSESSMENT / PLAN: 81 year old male with left corona radiata infarct and altered mentation requiring intubation for airway protection. Likely has some element of aspiration pneumonia. Wife and daughter updated this morning at bedside by me. I am concerned about the rise in the patient's troponin I. This may benefit from evaluation by cardiology although his echocardiogram mentions no wall motion abnormality. His respiratory status decline prohibits our ability to asked with the patient today.  1. Left corona radiata CVA: Management per neurology. Aspirin, Plavix, & Lipitor. 2. Acute hypoxic respiratory failure: Likely secured to aspiration pneumonia. Continuing to wean FiO2 & PEEP to maintain saturation greater than 94%. Plan for SBT once patient's  respiratory status improves. Starting Albuterol nebs q6hr to help with secretion clearance. 3. Altered mental status: Improving. Likely secondary to CVA.  4. Aspiration pneumonia: Continuing on Unasyn day #3/10. Tracheal aspirate culture showed only oral floor consistent with aspiration. 5. Acute renal failure: Improving. Continuing to trend renal function with daily BUN/creatinine. Also monitoring urine output with Foley catheter. 6. Hypomagnesemia: Replaced IV. Repeat magnesium level in AM. 7. Hypertension: Plan to initiate Cardene drip for systolic blood pressure greater than 180 mmHg.  8. Hyperlipidemia: Lipitor 80mg  via tube qhs. 9. H/O CAD S/P CABG/ICM: Troponin I appears to be. Continuing to trend. Echocardiogram without mention of wall motion abnormality. On Plavix & ASA. Consulting Cardiology given elevated troponin I. Restarting home Lipitor. 10. Diet: Dietitian consult for tube feed initiation. 11. Prophylaxis: SCDs, Protonix IV daily, & Lovenox subcutaneous daily.   I have spent a total of 36 minutes of critical care time today caring for the patient, updating the patient's family at bedside, & reviewing the patient's electronic medical record.  Sonia Baller Ashok Cordia, M.D. [facility] Pulmonary & Critical Care Pager:  [phone] After  3pm or if no response, call [phone] 12/23/2014, 11:08 AM

## 2014-12-23 NOTE — Progress Notes (Signed)
STROKE TEAM PROGRESS NOTE   HISTORY CORNIE Barber is an 81 y.o. male transfer from [facility] after presenting with acute onset of right sided weakness and aphasia. Evaluated by specialists on call in ED (at [facility]) and given tPA for NIHSS of 20. Transferred to [facility] after tele-neurologist suggested IR evaluation. Per ED notes, patient was last seen normal at 9:00 PM on 12/20/14, ~45 minutes after wife noted abnormal breathing and reported "flexing movements of left arm". Afterwards, patient was non-verbal, not moving his right side. ED notes mentioned a left gaze preference. CT head imaging reviewed, shows no acute process, signs of an old right MCA infarct. ED physician notes patient has history of fully blocked right ICA and 90% stenosis of L ICA.   Cerebral angiogram performed by Dr. Estanislado Barber at [facility] on 12/21/2014, no intervention, no acute findings.   Date last known well: 11/05 Time last known well: 2100 tPA Given: yes Modified Rankin: Rankin Score=0   SUBJECTIVE (INTERVAL HISTORY) Mental status improved, following commands. Increased ventilator requirements overnight, likely related to pneumonia.    OBJECTIVE Temp:  [96.9 F (36.1 C)-100 F (37.8 C)] 100 F (37.8 C) (11/08 0738) Pulse Rate:  [52-91] 84 (11/08 0600) Cardiac Rhythm:  [-] Normal sinus rhythm (11/07 2000) Resp:  [14-23] 21 (11/08 0600) BP: (97-191)/(58-106) 151/92 mmHg (11/08 0600) SpO2:  [91 %-97 %] 94 % (11/08 0600) Arterial Line BP: (76-160)/(53-114) 114/96 mmHg (11/08 0600) FiO2 (%):  [40 %] 40 % (11/08 0347)  CBC:   Recent Labs Lab 12/22/14 0316 12/23/14 0250  WBC 10.8* 13.1*  NEUTROABS  --  10.5*  HGB 11.1* 11.3*  HCT 35.7* 36.9*  MCV 89.5 90.2  PLT 134* 134*    Basic Metabolic Panel:   Recent Labs Lab 12/21/14 0409 12/22/14 0316 12/23/14 0250  NA  --  141 143  K  --  3.8 4.0  CL  --  110 110  CO2  --  25 24  GLUCOSE  --  131* 120*  BUN  --  21* 25*  CREATININE   --  1.75* 1.91*  CALCIUM  --  8.0* 8.5*  MG 1.6*  --  1.5*  PHOS 3.3  --  2.5    Lipid Panel:     Component Value Date/Time   CHOL 129 12/21/2014 0410   TRIG 74 12/21/2014 1549   HDL 27* 12/21/2014 0410   CHOLHDL 4.8 12/21/2014 0410   VLDL 23 12/21/2014 0410   LDLCALC 79 12/21/2014 0410   Urine Drug Screen:     Component Value Date/Time   LABOPIA NONE DETECTED 12/21/2014 0500   COCAINSCRNUR NONE DETECTED 12/21/2014 0500   LABBENZ POSITIVE* 12/21/2014 0500   AMPHETMU NONE DETECTED 12/21/2014 0500   THCU NONE DETECTED 12/21/2014 0500   LABBARB NONE DETECTED 12/21/2014 0500      IMAGING  Ct Abdomen Wo Contrast 12/21/2014   No evidence of significant hemorrhage within the chest, abdomen or pelvis. Confluent airspace consolidation in the posterior lower lobes. This could represent infectious infiltrate. Interstitial and alveolar edema is probably pulse present bilaterally.   Ct Head Wo Contrast 12/21/2014   No acute intracranial process.  Old RIGHT Chronic changes including old RIGHT MCA territory infarct and old LEFT basal ganglia lacunar infarcts. Findings of normal pressure hydrocephalus are similar.   Ct Chest Wo Contrast 12/21/2014   No evidence of significant hemorrhage within the chest, abdomen or pelvis. Confluent airspace consolidation in the posterior lower lobes. This could represent  infectious infiltrate. Interstitial and alveolar edema is probably pulse present bilaterally.   Dg Chest Port 1 View 12/21/2014   Satisfactory ET tube position. No other significant interval change.  MRI Brain 12/21/14 1. Left corona radiata 2.5 cm infarct without hemorrhage or mass effect. 2. No other acute intracranial abnormality. Chronic ischemic disease. Chronic right ICA occlusion. 3. Vertex scalp hematoma.  PHYSICAL EXAM  General: NAD. On ventilator. Follows most commands.  HEENT: PERRL, EOMI. Moist mucus membranes. OETT/OGT in place.  Neck: Supple, no lymphadenopathy or  carotid bruits Lungs: Air entry equal bilaterally, scattered rhonchi. No wheezes.  Heart: RRR, no murmurs, gallops, or rubs Abdomen: Soft, mildly distended, BS + Extremities: No cyanosis, clubbing, or edema  Neurological Exam  Mental Status - Improved, following most commands.   Cranial Nerves II - XII - II - Unable to assess III, IV, VI - Extraocular movements intact. Blinks/closes eyes to command V - Unable to assess. VII - Unable to assess. VIII - Hearing impaired X - Gag intact. XI - Unable to assess. XII - Unable to assess.  Motor Strength - Strength intact in left upper and lower extremities. RUE 0/5 strength, RLE 1/5 strength. Motor Tone - Muscle tone was assessed at the neck and appendages and was normal.  Reflexes - The patient's reflexes were 1+ in all extremities and he had no pathological reflexes.  Sensory - Unable to assess  Coordination - Unable to assess  Gait and Station - Unable to assess   ASSESSMENT/PLAN Darius Barber is a 81 y.o. male with history of previous CVA, HLD, h/o Colon CA, HTN, CAD, right carotid artery occlusion, vascular disease, history of a left carotid stent, and renal insufficiency, presenting with aphasia and right-sided weakness. He received IV t-PA at [facility] for NIHSS of 20.  Stroke:  Dominant Left Corona Radiata ischemic infarct likely 2/2 small vessel disease  Resultant  Left hemiplegia, aphasia  MRI: Left corona radiata 2.5 cm infarct without hemorrhage or mass effect.  Catheter Angiogram: Angiographically occluded right internal carotid artery and the left  external carotid artery. Mild intrastent stenosis of the left internal carotid artery.  Carotid Doppler (10/24/2014): Known right internal carotid artery occlusion, patent left internal  carotid artery with stent 1-39%. Will obtain report from catheter angiogram.  2D Echo: EF 55-60%. PAP 65 mm Hg  EEG: Moderate diffuse slowing   LDL - 79  HgbA1c 6.2  VTE  prophylaxis - SCDs, Lovenox 40 mg SQ qd Diet NPO time specified  aspirin 81 mg daily and clopidogrel 75 mg daily prior to admission, both restarted.   Ongoing aggressive stroke risk factor management  Therapy recommendations:  Pending  Disposition: Pending  Aspiration Pneumonia:  Increased vent needs overnight, likely 2/2 worsening pneumonia. Management per PCCM. Patient  Intubated, no plans to extubate today.   Continue Unasyn  Acute on CKD  Baseline cr closer to 1.4, increased to 1.88 most recently, most likely 2/2 acute illness.   Increase IVF to NS @ 75 cc/hr  Hypothyroidism  Restart Synthroid IV  Hypertension  Stable  Cardene gtt for SBP >180  Hyperlipidemia  Home meds:  Lipitor 80 mg daily not resumed in hospital. NPO  LDL 79, goal < 70  Add Lipitor 80 mg daily when taking PO  Continue statin at discharge  Other Stroke Risk Factors  Advanced age  Cigarette smoker, quit smoking 35 years ago   Hx stroke/TIA  Coronary artery disease   Patient with improved mental status today,  following commands on the left side. Increased ventilatory requirements overnight, not able to extubate. Will increase IVF to 75 cc/hr. OGT in place, restarted ASA + Plavix. PT/OT evaluation today.      Natasha Bence, MD PGY-3, Internal Medicine Pager: [phone]   To contact Stroke Continuity provider, please refer to [email or web address] After hours, contact General Neurology

## 2014-12-23 NOTE — Progress Notes (Signed)
CRITICAL VALUE ALERT  Critical value received: Troponin 2.32  Date of notification: 12/23/14  Time of notification:  3912  Critical value read back:Yes.    Nurse who received alert:  Tawni Carnes RN  MD notified (1st page):  E. Deterding Time of first page: 0354  MD notified (2nd page):  Time of second page:  Responding MD: E. Deterding   Time MD [id number]  No new orders at this time. Verdie Drown RN BSN

## 2014-12-24 ENCOUNTER — Inpatient Hospital Stay (HOSPITAL_COMMUNITY): Payer: Medicare HMO

## 2014-12-24 DIAGNOSIS — J690 Pneumonitis due to inhalation of food and vomit: Secondary | ICD-10-CM

## 2014-12-24 DIAGNOSIS — I639 Cerebral infarction, unspecified: Secondary | ICD-10-CM

## 2014-12-24 DIAGNOSIS — Z9911 Dependence on respirator [ventilator] status: Secondary | ICD-10-CM | POA: Insufficient documentation

## 2014-12-24 DIAGNOSIS — N189 Chronic kidney disease, unspecified: Secondary | ICD-10-CM

## 2014-12-24 DIAGNOSIS — N179 Acute kidney failure, unspecified: Secondary | ICD-10-CM

## 2014-12-24 LAB — CBC WITH DIFFERENTIAL/PLATELET
BASOS PCT: 0 %
Basophils Absolute: 0 10*3/uL (ref 0.0–0.1)
EOS ABS: 0 10*3/uL (ref 0.0–0.7)
EOS PCT: 0 %
HCT: 32.2 % — ABNORMAL LOW (ref 39.0–52.0)
HEMOGLOBIN: 10.3 g/dL — AB (ref 13.0–17.0)
Lymphocytes Relative: 8 %
Lymphs Abs: 0.8 10*3/uL (ref 0.7–4.0)
MCH: 28.3 pg (ref 26.0–34.0)
MCHC: 32 g/dL (ref 30.0–36.0)
MCV: 88.5 fL (ref 78.0–100.0)
Monocytes Absolute: 0.7 10*3/uL (ref 0.1–1.0)
Monocytes Relative: 8 %
NEUTROS PCT: 84 %
Neutro Abs: 7.8 10*3/uL — ABNORMAL HIGH (ref 1.7–7.7)
PLATELETS: 123 10*3/uL — AB (ref 150–400)
RBC: 3.64 MIL/uL — AB (ref 4.22–5.81)
RDW: 16.2 % — ABNORMAL HIGH (ref 11.5–15.5)
WBC: 9.3 10*3/uL (ref 4.0–10.5)

## 2014-12-24 LAB — GLUCOSE, CAPILLARY
GLUCOSE-CAPILLARY: 134 mg/dL — AB (ref 65–99)
GLUCOSE-CAPILLARY: 141 mg/dL — AB (ref 65–99)
GLUCOSE-CAPILLARY: 144 mg/dL — AB (ref 65–99)
Glucose-Capillary: 141 mg/dL — ABNORMAL HIGH (ref 65–99)
Glucose-Capillary: 148 mg/dL — ABNORMAL HIGH (ref 65–99)
Glucose-Capillary: 122 mg/dL — ABNORMAL HIGH (ref 65–99)

## 2014-12-24 LAB — BASIC METABOLIC PANEL
Anion gap: 8 (ref 5–15)
BUN: 37 mg/dL — AB (ref 6–20)
CALCIUM: 8.3 mg/dL — AB (ref 8.9–10.3)
CO2: 24 mmol/L (ref 22–32)
Chloride: 112 mmol/L — ABNORMAL HIGH (ref 101–111)
Creatinine, Ser: 1.83 mg/dL — ABNORMAL HIGH (ref 0.61–1.24)
GFR calc Af Amer: 38 mL/min — ABNORMAL LOW (ref >60)
GFR, EST NON AFRICAN AMERICAN: 33 mL/min — AB (ref >60)
GLUCOSE: 179 mg/dL — AB (ref 65–99)
POTASSIUM: 3.9 mmol/L (ref 3.5–5.1)
SODIUM: 144 mmol/L (ref 135–145)

## 2014-12-24 LAB — TROPONIN I
TROPONIN I: 1.92 ng/mL — AB (ref <0.031)
Troponin I: 0.9 ng/mL (ref <0.031)

## 2014-12-24 LAB — MAGNESIUM: MAGNESIUM: 2.2 mg/dL (ref 1.7–2.4)

## 2014-12-24 MED ORDER — INSULIN ASPART 100 UNIT/ML ~~LOC~~ SOLN
0.0000 [IU] | SUBCUTANEOUS | Status: DC
  Administered 2014-12-24 – 2014-12-25 (×4): 1 [IU] via SUBCUTANEOUS
  Administered 2014-12-25: 2 [IU] via SUBCUTANEOUS
  Administered 2014-12-25 (×2): 1 [IU] via SUBCUTANEOUS
  Administered 2014-12-25 (×2): 2 [IU] via SUBCUTANEOUS
  Administered 2014-12-26 (×2): 1 [IU] via SUBCUTANEOUS
  Administered 2014-12-26: 2 [IU] via SUBCUTANEOUS
  Administered 2014-12-26 – 2014-12-27 (×5): 1 [IU] via SUBCUTANEOUS
  Administered 2014-12-27: 2 [IU] via SUBCUTANEOUS
  Administered 2014-12-27 (×2): 1 [IU] via SUBCUTANEOUS
  Administered 2014-12-28: 2 [IU] via SUBCUTANEOUS
  Administered 2014-12-28 (×5): 1 [IU] via SUBCUTANEOUS
  Administered 2014-12-28: 2 [IU] via SUBCUTANEOUS
  Administered 2014-12-29 (×4): 1 [IU] via SUBCUTANEOUS
  Administered 2014-12-30: 2 [IU] via SUBCUTANEOUS
  Administered 2014-12-30: 1 [IU] via SUBCUTANEOUS
  Administered 2014-12-30 (×3): 2 [IU] via SUBCUTANEOUS
  Administered 2014-12-30: 1 [IU] via SUBCUTANEOUS
  Administered 2014-12-31 (×3): 2 [IU] via SUBCUTANEOUS
  Administered 2014-12-31 (×2): 1 [IU] via SUBCUTANEOUS
  Administered 2014-12-31 – 2015-01-01 (×2): 2 [IU] via SUBCUTANEOUS
  Administered 2015-01-01: 1 [IU] via SUBCUTANEOUS
  Administered 2015-01-01: 2 [IU] via SUBCUTANEOUS
  Administered 2015-01-01: 1 [IU] via SUBCUTANEOUS
  Administered 2015-01-01 (×2): 2 [IU] via SUBCUTANEOUS
  Administered 2015-01-02: 1 [IU] via SUBCUTANEOUS
  Administered 2015-01-02: 2 [IU] via SUBCUTANEOUS
  Administered 2015-01-02 – 2015-01-03 (×4): 1 [IU] via SUBCUTANEOUS
  Administered 2015-01-03: 2 [IU] via SUBCUTANEOUS
  Administered 2015-01-04 – 2015-01-05 (×2): 1 [IU] via SUBCUTANEOUS
  Administered 2015-01-05: 2 [IU] via SUBCUTANEOUS
  Administered 2015-01-05 (×2): 1 [IU] via SUBCUTANEOUS
  Administered 2015-01-05: 2 [IU] via SUBCUTANEOUS
  Administered 2015-01-05 – 2015-01-07 (×7): 1 [IU] via SUBCUTANEOUS
  Administered 2015-01-07: 2 [IU] via SUBCUTANEOUS
  Administered 2015-01-07: 1 [IU] via SUBCUTANEOUS
  Administered 2015-01-07 (×2): 2 [IU] via SUBCUTANEOUS
  Administered 2015-01-08 (×6): 1 [IU] via SUBCUTANEOUS
  Administered 2015-01-09 (×2): 2 [IU] via SUBCUTANEOUS
  Administered 2015-01-09 (×3): 1 [IU] via SUBCUTANEOUS
  Administered 2015-01-10 (×2): 2 [IU] via SUBCUTANEOUS
  Administered 2015-01-10 – 2015-01-11 (×5): 1 [IU] via SUBCUTANEOUS
  Administered 2015-01-11: 2 [IU] via SUBCUTANEOUS
  Administered 2015-01-12 (×3): 1 [IU] via SUBCUTANEOUS
  Administered 2015-01-13: 2 [IU] via SUBCUTANEOUS
  Administered 2015-01-13 – 2015-01-15 (×11): 1 [IU] via SUBCUTANEOUS
  Administered 2015-01-15: 2 [IU] via SUBCUTANEOUS
  Administered 2015-01-15: 1 [IU] via SUBCUTANEOUS

## 2014-12-24 MED ORDER — ACETYLCYSTEINE 20 % IN SOLN
3.0000 mL | Freq: Three times a day (TID) | RESPIRATORY_TRACT | Status: DC
  Administered 2014-12-24 – 2014-12-25 (×3): 3 mL via RESPIRATORY_TRACT
  Filled 2014-12-24 (×5): qty 4

## 2014-12-24 MED ORDER — FUROSEMIDE 10 MG/ML IJ SOLN
60.0000 mg | Freq: Once | INTRAMUSCULAR | Status: AC
  Administered 2014-12-24: 60 mg via INTRAVENOUS
  Filled 2014-12-24: qty 6

## 2014-12-24 MED ORDER — FUROSEMIDE 10 MG/ML IJ SOLN
20.0000 mg | Freq: Once | INTRAMUSCULAR | Status: AC
  Administered 2014-12-24: 20 mg via INTRAVENOUS
  Filled 2014-12-24: qty 2

## 2014-12-24 MED ORDER — GUAIFENESIN 100 MG/5ML PO SOLN
15.0000 mL | Freq: Four times a day (QID) | ORAL | Status: DC
  Administered 2014-12-24 – 2015-01-08 (×54): 300 mg
  Filled 2014-12-24 (×7): qty 15
  Filled 2014-12-24: qty 10
  Filled 2014-12-24 (×3): qty 5
  Filled 2014-12-24 (×3): qty 15
  Filled 2014-12-24: qty 5
  Filled 2014-12-24 (×2): qty 15
  Filled 2014-12-24: qty 5
  Filled 2014-12-24: qty 15
  Filled 2014-12-24: qty 5
  Filled 2014-12-24 (×2): qty 15
  Filled 2014-12-24 (×2): qty 5
  Filled 2014-12-24: qty 15
  Filled 2014-12-24: qty 5
  Filled 2014-12-24 (×4): qty 15
  Filled 2014-12-24: qty 5
  Filled 2014-12-24 (×2): qty 15
  Filled 2014-12-24 (×2): qty 5
  Filled 2014-12-24: qty 10
  Filled 2014-12-24 (×2): qty 15
  Filled 2014-12-24: qty 5
  Filled 2014-12-24 (×4): qty 15
  Filled 2014-12-24: qty 10
  Filled 2014-12-24 (×2): qty 15
  Filled 2014-12-24 (×2): qty 5
  Filled 2014-12-24 (×2): qty 15
  Filled 2014-12-24: qty 10
  Filled 2014-12-24: qty 5
  Filled 2014-12-24: qty 15
  Filled 2014-12-24: qty 30
  Filled 2014-12-24: qty 10
  Filled 2014-12-24 (×4): qty 15
  Filled 2014-12-24 (×2): qty 5
  Filled 2014-12-24 (×5): qty 15
  Filled 2014-12-24: qty 5

## 2014-12-24 MED ORDER — LEVOTHYROXINE SODIUM 25 MCG PO TABS
150.0000 ug | ORAL_TABLET | Freq: Every day | ORAL | Status: DC
  Administered 2014-12-25 – 2015-01-08 (×14): 150 ug via ORAL
  Filled 2014-12-24 (×21): qty 1

## 2014-12-24 MED ORDER — PANTOPRAZOLE SODIUM 40 MG PO PACK
40.0000 mg | PACK | Freq: Every day | ORAL | Status: DC
  Administered 2014-12-24 – 2015-01-15 (×23): 40 mg
  Filled 2014-12-24 (×21): qty 20

## 2014-12-24 NOTE — Progress Notes (Signed)
SLP Cancellation Note  Patient Details Name: Darius Barber MRN: [id number] DOB: [birth year]   Cancelled treatment:       Reason Eval/Treat Not Completed: Medical issues which prohibited therapy (Remains intubated. Signing off. Please re-consult. )  Gabriel Rainwater [place], CCC-SLP [phone]  [name] 12/24/2014, 7:22 AM

## 2014-12-24 NOTE — Progress Notes (Addendum)
PULMONARY  / [place]   Name: CARLYN MULLENBACH MRN: [id number] DOB: 1931/10/20    ADMISSION DATE:  12/21/2014 CONSULTATION DATE: 12/21/2014  REQUESTING CLINICIAN: Dr. Leonie Man PRIMARY SERVICE: Neurology  CHIEF COMPLAINT:  Stroke  BRIEF PATIENT DESCRIPTION: 61 M transferred from [facility] for VIR evaluation following presumed CVA. GCS 15 on arrival to NSICU but developed AMS with GCS of 7. PCCM consulted for intubation and vent management.  SIGNIFICANT EVENTS: 11/6 - Admitted to hospital  STUDIES:  Portable CXR 11/7 - ETT 4.4cm above carina. No focal opacity. CT Head 11/7 - Left corona radiata infarct. No hemorrhage. Old infarcts.  TTE 11/7 - LV normal in size. Mild LVH. EF 55-60%. LA normal in size. RA normal in size. RV normal in size and function. No pericardial effusion. Portable CXR 11/9 - ETT good position. Developing bilateral alveolar opacities. No pleural effusion.  LINES / TUBES: ETT 11/6  CULTURES: Tracheal Aspirate (11/6):  Oral Flora  ANTIBIOTICS: Unasyn 11/6>>  HISTORY OF PRESENT ILLNESS:  Mr. Kaneshiro is an 57 M with prior CVA, CAD, carotid stenosis who presented to [facility] today for stroke-like symptoms. Patient is currently altered and unable to provide any history. He went to VIR on arrival with a normal angiogram. Shortly after arrival to NSICU patient became altered with GCS of 7. PCCM called for emergent intubation.   SUBJECTIVE: Persistent secretions. Continuing to worsen with regards to oxygen requirement and hypoxia.  REVIEW OF SYSTEMS:  Unable to obtain secondary to patient's intubation.  VITAL SIGNS: Temp:  [98.3 F (36.8 C)-101 F (38.3 C)] 101 F (38.3 C) (11/09 1146) Pulse Rate:  [55-89] 72 (11/09 1103) Resp:  [14-24] 21 (11/09 1103) BP: (96-164)/(54-90) 134/59 mmHg (11/09 1103) SpO2:  [87 %-100 %] 93 % (11/09 1103) Arterial Line BP: (77-158)/(61-153) 117/99 mmHg (11/09 0800) FiO2 (%):  [50 %-60 %] 60 %  (11/09 1103) Weight:  [93.6 kg (206 lb 5.6 oz)] 93.6 kg (206 lb 5.6 oz) (11/09 0500) HEMODYNAMICS:   VENTILATOR SETTINGS: Vent Mode:  [-] PRVC FiO2 (%):  [50 %-60 %] 60 % Set Rate:  [15 bmp] 15 bmp Vt Set:  [600 mL] 600 mL PEEP:  [8 cmH20-10 cmH20] 10 cmH20 Pressure Support:  [10 cmH20] 10 cmH20 Plateau Pressure:  [25 cmH20-28 cmH20] 28 cmH20 INTAKE / OUTPUT: Intake/Output      11/08 0701 - 11/09 0700 11/09 0701 - 11/10 0700   I.V. (mL/kg) 2592.8 (27.7) 83.8 (0.9)   NG/GT 705 150   IV Piggyback 250    Total Intake(mL/kg) 3547.8 (37.9) 233.8 (2.5)   Urine (mL/kg/hr) 1535 (0.7) 425 (0.9)   Emesis/NG output  0 (0)   Stool 0 (0)    Total Output 1535 425   Net +2012.8 -191.2        Stool Occurrence 2 x      PHYSICAL EXAMINATION: General:  Obese male. Sedated. Wife & daughter at bedside again today.  Integument:  Warm & dry. No rash on exposed skin.  HEENT:  No scleral injection. Endotracheal tube in place. Moist membranes. Cardiovascular:  Regular rate. Unable to appreciate JVD given body habitus.  Pulmonary:  Coarse breath sounds bilaterally. Symmetric chest wall rise on ventilator. Abdomen: Soft. Normal bowel sounds. Protuberant. Neurological: Spontaneously moving left upper & lower extremities. Not following commands on sedation. Eyes open.   LABS:  CBC  Recent Labs Lab 12/22/14 0316 12/23/14 0250 12/24/14 0314  WBC 10.8* 13.1* 9.3  HGB 11.1* 11.3* 10.3*  HCT  35.7* 36.9* 32.2*  PLT 134* 134* 123*   Coag's  Recent Labs Lab 12/21/14 0255  INR 1.18   BMET  Recent Labs Lab 12/23/14 0250 12/23/14 0750 12/24/14 0314  NA 143 144 144  K 4.0 3.6 3.9  CL 110 108 112*  CO2 24 23 24   BUN 25* 24* 37*  CREATININE 1.91* 1.88* 1.83*  GLUCOSE 120* 116* 179*   Electrolytes  Recent Labs Lab 12/21/14 0409  12/23/14 0250 12/23/14 0750 12/24/14 0314  CALCIUM  --   < > 8.5* 8.5* 8.3*  MG 1.6*  --  1.5*  --  2.2  PHOS 3.3  --  2.5  --   --   < > = values in  this interval not displayed. Sepsis Markers  Recent Labs Lab 12/21/14 0255  LATICACIDVEN 0.9   ABG  Recent Labs Lab 12/21/14 1005  PHART 7.437  PCO2ART 36.9  PO2ART 106*   Liver Enzymes  Recent Labs Lab 12/21/14 0255 12/22/14 0316 12/23/14 0250  AST 17 23  --   ALT 13* 11*  --   ALKPHOS 60 51  --   BILITOT 0.5 0.9  --   ALBUMIN 3.0* 2.6* 2.6*   Cardiac Enzymes  Recent Labs Lab 12/23/14 2105 12/24/14 0314 12/24/14 1005  TROPONINI 1.84* 1.92* 0.90*   Glucose  Recent Labs Lab 12/23/14 1203 12/23/14 1626 12/23/14 2024 12/23/14 2352 12/24/14 0335 12/24/14 0746  GLUCAP 108* 134* 121* 134* 144* 141*    Imaging Dg Chest Port 1 View  12/24/2014  CLINICAL DATA:  Respiratory distress, patient on ventilator. Respiratory failure. EXAM: PORTABLE CHEST 1 VIEW COMPARISON:  12/22/2014 and CT chest 12/21/2014. FINDINGS: Endotracheal tube terminates 2.7 cm above the carina. Nasogastric tube is followed into the stomach. Heart size is grossly stable. Lungs are low in volume with worsening bilateral airspace opacification. No definite pleural fluid. IMPRESSION: Worsening bilateral airspace opacification may be due to edema. Pneumonia is not excluded. Electronically Signed   By: Lorin Picket M.D.   On: 12/24/2014 08:17   Dg Abd Portable 1v  12/22/2014  CLINICAL DATA:  NG tube placement, stroke EXAM: PORTABLE ABDOMEN - 1 VIEW COMPARISON:  12/22/2014 FINDINGS: NG tube extends to the proximal stomach. Side port is in the distal esophagus. Normal bowel gas pattern. Degenerative changes of the spine. Heart is enlarged. Prior median sternotomy noted. IMPRESSION: NG tube tip proximal stomach. Electronically Signed   By: Jerilynn Mages.  Shick M.D.   On: 12/22/2014 17:49    Labwork from [place].  Na 141 K 3.9 Cl 102 Co2 30.1 BUN 25 Cr 1.83 Glu 136 Ca 8.7   WBC 8.3 HgB 13.4 Hct 43.3 PLT 141  ASSESSMENT / PLAN: 81 year old male with left corona radiata infarct  and altered mentation requiring intubation for airway protection. Aspiration pneumonia seems to be progressing with likely some element of underlying pulmonary edema contributing. I believe the patient's limited neurological exam today is likely due to some sedation requirement. I am increasing his airway clearance with chest PT and adding a mucolytic today. Appreciate cardiology input regarding elevated troponin I. We will continue to support the patient with medical treatment given his known history of coronary artery disease. Status remains critical with high potential for further clinical decompensation. The patient's wife and daughter are appropriately concerned and were updated by myself at bedside this morning.  1. Left corona radiata CVA: Management per neurology. Aspirin, Plavix, & Lipitor. 2. Acute hypoxic respiratory failure: Likely secondary  to aspiration pneumonia.  Continuing to wean FiO2 to maintain saturation greater than 94%. Plan for SBT once patient's respiratory status improves. Albuterol nebs q6hr, guaifenesin via tube every 6 hours, & chest PT via bed every 4 hours  to help with secretion clearance. Giving Lasix 20 mg IV 1 with goal net -1 L over the next 24 hours.  3. Altered mental status:  Stable. Likely secondary to CVA as well as sedation requirement while on ventilator.  4. Aspiration pneumonia: Continuing on Unasyn day #4/10. Tracheal aspirate culture showed only oral floor consistent with aspiration. 5. H/O CAD S/P CABG/ICM:  Appreciate cardiology recommendations. Likely stress/demand ischemia. Echocardiogram without mention of wall motion abnormality. On Plavix, Lipitor, & ASA.  6. Acute renal failure:  Stable. Continuing to trend renal function with daily BUN/creatinine. Also monitoring urine output with Foley catheter. 7. Anemia: No signs of active bleeding. Continuing to trend hemoglobin daily with CBC. 8. Thrombocytopenia: Mild. Continue to trend daily with  CBC. 9. Hyperglycemia: Hemoglobin A1c 6.2. Starting Accu-Cheks every 4 hours with low-dose sliding scale coverage. 10. Hypomagnesemia:  Resolved. Repeat magnesium level in AM. 11. Hypertension: Plan to initiate Cardene drip for systolic blood pressure greater than 180 mmHg.  12. History of Hyperlipidemia: Lipitor 80mg  via tube qhs. 13. History of Hypothyroidism:  Continuing levothyroxine 150 g via tube daily. 14. Diet: Continuing tube feeds per dietary recommendations.  15. Prophylaxis: SCDs, Protonix IV daily, & Lovenox subcutaneous daily.   I have spent a total of 39 minutes of critical care time today caring for the patient, updating the patient's family at bedside, & reviewing the patient's electronic medical record.  Sonia Baller Ashok Cordia, M.D. [facility] Pulmonary & Critical Care Pager:  [phone] After 3pm or if no response, call [phone] 12/24/2014, 12:16 PM

## 2014-12-24 NOTE — Progress Notes (Signed)
Pt remains sedated and on ventilator s/p CVA with pnemonia/respiratory failure.  Pt on IV Unasyn and receiving chest PT.   Family has been at bedside.   Will continue to follow progress.    Reinaldo Raddle, RN, BSN  Trauma/Neuro ICU Case Manager [phone]

## 2014-12-24 NOTE — Progress Notes (Signed)
Pharmacy Antibiotic Time-Out Note  Darius Barber is a 81 y.o. year-old male admitted on 12/21/2014.  The patient is currently on day 4 of Unasyn for empiric coverage of aspiration pneumonia.  Assessment/Plan: This patient's current antibiotics will be continued without adjustments. Per CCM note, patient is to continue on Unasyn for a total of 10 days.  Will add stop date for 11/16.   Recent Labs Lab 12/21/14 0255 12/22/14 0316 12/23/14 0250 12/24/14 0314  WBC 7.7 10.8* 13.1* 9.3    Recent Labs Lab 12/21/14 0255 12/22/14 0316 12/23/14 0250 12/23/14 0750 12/24/14 0314  CREATININE 1.78* 1.75* 1.91* 1.88* 1.83*   Estimated Creatinine Clearance: 35.8 mL/min (by C-G formula based on Cr of 1.83). Tmax/24h 100.2  Antimicrobial allergies: none  Antimicrobials this admission: Unasyn 11/6 >> (11/16)  Levels/dose changes this admission: None  Microbiology Results: 11/6 Trach culture: normal flora 11/6 MRSA PCR: negative  Thank you for allowing pharmacy to be a part of this patient's care.  Cassie L. Nicole Kindred, PharmD PGY2 Infectious Diseases Pharmacy Resident Pager: [phone] 12/24/2014 7:54 AM

## 2014-12-24 NOTE — Progress Notes (Signed)
STROKE TEAM PROGRESS NOTE   HISTORY Darius Barber is an 81 y.o. male transfer from [facility] after presenting with acute onset of right sided weakness and aphasia. Evaluated by specialists on call in ED (at [facility]) and given tPA for NIHSS of 20. Transferred to [facility] after tele-neurologist suggested IR evaluation. Per ED notes, patient was last seen normal at 9:00 PM on 12/20/14, ~45 minutes after wife noted abnormal breathing and reported "flexing movements of left arm". Afterwards, patient was non-verbal, not moving his right side. ED notes mentioned a left gaze preference. CT head imaging reviewed, shows no acute process, signs of an old right MCA infarct. ED physician notes patient has history of fully blocked right ICA and 90% stenosis of L ICA.   Cerebral angiogram performed by Dr. Estanislado Pandy on 12/21/2014, no intervention, no acute findings.   Date last known well: 11/05 Time last known well: 2100 tPA Given: yes Modified Rankin: Rankin Score=0   SUBJECTIVE (INTERVAL HISTORY) Still high vent requirements. CXR with worsening bilateral infiltrate. No significant change in neuro exam.   OBJECTIVE Temp:  [98.3 F (36.8 C)-100.2 F (37.9 C)] 98.4 F (36.9 C) (11/09 0400) Pulse Rate:  [55-89] 65 (11/09 0726) Cardiac Rhythm:  [-] Normal sinus rhythm (11/09 0600) Resp:  [11-26] 20 (11/09 0726) BP: (96-158)/(54-90) 111/89 mmHg (11/09 0726) SpO2:  [91 %-100 %] 94 % (11/09 0726) Arterial Line BP: (77-169)/(61-159) 104/84 mmHg (11/09 0700) FiO2 (%):  [50 %] 50 % (11/09 0726) Weight:  [206 lb 5.6 oz (93.6 kg)] 206 lb 5.6 oz (93.6 kg) (11/09 0500)  CBC:   Recent Labs Lab 12/23/14 0250 12/24/14 0314  WBC 13.1* 9.3  NEUTROABS 10.5* 7.8*  HGB 11.3* 10.3*  HCT 36.9* 32.2*  MCV 90.2 88.5  PLT 134* 123*    Basic Metabolic Panel:   Recent Labs Lab 12/21/14 0409  12/23/14 0250 12/23/14 0750 12/24/14 0314  NA  --   < > 143 144 144  K  --   < > 4.0 3.6 3.9  CL  --   < > 110  108 112*  CO2  --   < > 24 23 24   GLUCOSE  --   < > 120* 116* 179*  BUN  --   < > 25* 24* 37*  CREATININE  --   < > 1.91* 1.88* 1.83*  CALCIUM  --   < > 8.5* 8.5* 8.3*  MG 1.6*  --  1.5*  --  2.2  PHOS 3.3  --  2.5  --   --   < > = values in this interval not displayed.  Lipid Panel:     Component Value Date/Time   CHOL 129 12/21/2014 0410   TRIG 74 12/21/2014 1549   HDL 27* 12/21/2014 0410   CHOLHDL 4.8 12/21/2014 0410   VLDL 23 12/21/2014 0410   LDLCALC 79 12/21/2014 0410   Urine Drug Screen:     Component Value Date/Time   LABOPIA NONE DETECTED 12/21/2014 0500   COCAINSCRNUR NONE DETECTED 12/21/2014 0500   LABBENZ POSITIVE* 12/21/2014 0500   AMPHETMU NONE DETECTED 12/21/2014 0500   THCU NONE DETECTED 12/21/2014 0500   LABBARB NONE DETECTED 12/21/2014 0500      IMAGING  Ct Abdomen Wo Contrast 12/21/2014   No evidence of significant hemorrhage within the chest, abdomen or pelvis. Confluent airspace consolidation in the posterior lower lobes. This could represent infectious infiltrate. Interstitial and alveolar edema is probably pulse present bilaterally.   Ct Head Wo  Contrast 12/21/2014   No acute intracranial process.  Old RIGHT Chronic changes including old RIGHT MCA territory infarct and old LEFT basal ganglia lacunar infarcts. Findings of normal pressure hydrocephalus are similar.   Ct Chest Wo Contrast 12/21/2014   No evidence of significant hemorrhage within the chest, abdomen or pelvis. Confluent airspace consolidation in the posterior lower lobes. This could represent infectious infiltrate. Interstitial and alveolar edema is probably pulse present bilaterally.   Dg Chest Port 1 View 12/21/2014   Satisfactory ET tube position. No other significant interval change.  CXR 1 view 12/24/14 Worsening bilateral airspace opacification may be due to edema. Pneumonia is not excluded.   MRI Brain 12/21/14 1. Left corona radiata 2.5 cm infarct without hemorrhage or  mass effect. 2. No other acute intracranial abnormality. Chronic ischemic disease. Chronic right ICA occlusion. 3. Vertex scalp hematoma.  PHYSICAL EXAM  General: NAD. On ventilator. Follows most commands on the left.  HEENT: PERRL, EOMI. Moist mucus membranes. OETT/OGT in place.  Neck: Supple, no lymphadenopathy or carotid bruits Lungs: Air entry equal bilaterally, scattered coarse rhonchi. No wheezes.  Heart: RRR, no murmurs, gallops, or rubs Abdomen: Soft, mildly distended, BS + Extremities: No cyanosis, clubbing, or edema  Neurological Exam  Mental Status - Following most commands on the right  Cranial Nerves II - XII - II - Unable to assess III, IV, VI - Extraocular movements intact with some right-sided neglect. Blinks/closes eyes to command V - Unable to assess. VII - Unable to assess. VIII - Hearing impaired X - Gag intact. XI - Unable to assess. XII - Unable to assess.  Motor Strength - Strength intact in left upper extremity, RLE tone intact. RUE 0/5 strength, RLE 1/5 strength. Motor Tone - Muscle tone was assessed at the neck and appendages and was normal.   Reflexes - The patient's reflexes were 1+ in all extremities and he had no pathological reflexes.  Sensory - Unable to assess  Coordination - Unable to assess  Gait and Station - Unable to assess   ASSESSMENT/PLAN Mr. Darius Barber is a 81 y.o. male with history of previous CVA, HLD, h/o Colon CA, HTN, CAD, right carotid artery occlusion, vascular disease, history of a left carotid stent, and renal insufficiency, presenting with aphasia and right-sided weakness. He received IV t-PA at [facility] for NIHSS of 20.  Stroke:  Dominant Left Corona Radiata ischemic infarct likely 2/2 small vessel disease  Resultant  Left hemiplegia, aphasia  MRI: Left corona radiata 2.5 cm infarct without hemorrhage or mass effect.  Catheter Angiogram: Angiographically occluded right internal carotid artery and the  left  external carotid artery. Mild intrastent stenosis of the left internal carotid artery.  Carotid Doppler (10/24/2014): Known right internal carotid artery occlusion, patent left internal  carotid artery with stent 1-39%. Will obtain report from catheter angiogram.  2D Echo: EF 55-60%. PAP 65 mm Hg  EEG: Moderate diffuse slowing   LDL - 79  HgbA1c 6.2  VTE prophylaxis - SCDs, Lovenox 40 mg SQ qd Diet NPO time specified  aspirin 81 mg daily and clopidogrel 75 mg daily prior to admission, both restarted.   Ongoing aggressive stroke risk factor management  Therapy recommendations:  SNF  Disposition: Pending  Troponin Elevation  Patient with troponin peak ~2, most likely related to demand ischemia 2/2 stroke and pneumonia.  Continue to monitor   Aspiration Pneumonia:  Increased vent needs, likely 2/2 worsening pneumonia + possible edema. Management per PCCM.  Patient intubated, no  plans to extubate today. Did not tolerate wean this AM.   Continue Unasyn  Lasix 40 mg IV once  Acute on CKD  Baseline cr closer to 1.4, seems to have peaked at ~1.88 most likely 2/2 acute illness.   Discontinue IVF given worsening CXR. See Above  Hypothyroidism  Synthroid IV  Hypertension  Stable  Cardene gtt for SBP >180  Hyperlipidemia  Home meds:  Lipitor 80 mg daily not resumed in hospital. NPO  LDL 79, goal < 70  Add Lipitor 80 mg daily when taking PO  Continue statin at discharge  Other Stroke Risk Factors  Advanced age  Cigarette smoker, quit smoking 35 years ago   Hx stroke/TIA  Coronary artery disease   Increased vent requirements likely 2/2 worsening pneumonia and interstitial edema. Continue ABx, give Lasix, discontinue IVF for now. No change in neuro exam today.      Natasha Bence, MD PGY-3, Internal Medicine Pager: [phone]   To contact Stroke Continuity provider, please refer to [email or web address] After hours, contact General Neurology

## 2014-12-24 NOTE — Progress Notes (Signed)
eLink Physician-Brief Progress Note Patient Name: JAHSIR RAMA DOB: [birth year] MRN: [id number]   Date of Service  Pos balance still  HPI/Events of Note    eICU Interventions  Add lasix as still pos today 1.3 iters     Intervention Category Intermediate Interventions: Hypervolemia - evaluation and management  Prestina Raigoza J. 12/24/2014, 7:30 PM

## 2014-12-24 NOTE — Progress Notes (Signed)
OT Cancellation Note  Patient Details Name: Darius Barber MRN: [id number] DOB: August 18, 1931   Cancelled Treatment:    Reason Eval/Treat Not Completed: Other (comment) Pt sedated/ on vent. Will follow up tomorrow.  [place], OTR/L  [phone] 12/24/2014 12/24/2014, 4:05 PM

## 2014-12-25 ENCOUNTER — Inpatient Hospital Stay (HOSPITAL_COMMUNITY): Payer: Medicare HMO

## 2014-12-25 DIAGNOSIS — E875 Hyperkalemia: Secondary | ICD-10-CM

## 2014-12-25 LAB — GLUCOSE, CAPILLARY
GLUCOSE-CAPILLARY: 157 mg/dL — AB (ref 65–99)
GLUCOSE-CAPILLARY: 139 mg/dL — AB (ref 65–99)
Glucose-Capillary: 124 mg/dL — ABNORMAL HIGH (ref 65–99)
Glucose-Capillary: 140 mg/dL — ABNORMAL HIGH (ref 65–99)
Glucose-Capillary: 164 mg/dL — ABNORMAL HIGH (ref 65–99)

## 2014-12-25 LAB — BASIC METABOLIC PANEL
Anion gap: 9 (ref 5–15)
BUN: 40 mg/dL — AB (ref 6–20)
CALCIUM: 8.7 mg/dL — AB (ref 8.9–10.3)
CO2: 26 mmol/L (ref 22–32)
CREATININE: 2.01 mg/dL — AB (ref 0.61–1.24)
Chloride: 108 mmol/L (ref 101–111)
GFR calc Af Amer: 34 mL/min — ABNORMAL LOW (ref >60)
GFR, EST NON AFRICAN AMERICAN: 29 mL/min — AB (ref >60)
GLUCOSE: 157 mg/dL — AB (ref 65–99)
POTASSIUM: 2.9 mmol/L — AB (ref 3.5–5.1)
SODIUM: 143 mmol/L (ref 135–145)

## 2014-12-25 LAB — CBC WITH DIFFERENTIAL/PLATELET
BASOS ABS: 0 10*3/uL (ref 0.0–0.1)
BASOS PCT: 0 %
Eosinophils Absolute: 0.1 10*3/uL (ref 0.0–0.7)
Eosinophils Relative: 1 %
HEMATOCRIT: 36.9 % — AB (ref 39.0–52.0)
HEMOGLOBIN: 11.6 g/dL — AB (ref 13.0–17.0)
Lymphocytes Relative: 11 %
Lymphs Abs: 1 10*3/uL (ref 0.7–4.0)
MCH: 27.9 pg (ref 26.0–34.0)
MCHC: 31.4 g/dL (ref 30.0–36.0)
MCV: 88.7 fL (ref 78.0–100.0)
MONOS PCT: 7 %
Monocytes Absolute: 0.7 10*3/uL (ref 0.1–1.0)
NEUTROS ABS: 7.9 10*3/uL — AB (ref 1.7–7.7)
NEUTROS PCT: 81 %
Platelets: 137 10*3/uL — ABNORMAL LOW (ref 150–400)
RBC: 4.16 MIL/uL — AB (ref 4.22–5.81)
RDW: 16.3 % — ABNORMAL HIGH (ref 11.5–15.5)
WBC: 9.8 10*3/uL (ref 4.0–10.5)

## 2014-12-25 LAB — TRIGLYCERIDES: TRIGLYCERIDES: 152 mg/dL — AB (ref <150)

## 2014-12-25 LAB — MAGNESIUM: Magnesium: 2 mg/dL (ref 1.7–2.4)

## 2014-12-25 MED ORDER — POTASSIUM CHLORIDE 20 MEQ/15ML (10%) PO SOLN
40.0000 meq | Freq: Once | ORAL | Status: AC
  Administered 2014-12-25: 40 meq
  Filled 2014-12-25: qty 30

## 2014-12-25 MED ORDER — HEPARIN SODIUM (PORCINE) 5000 UNIT/ML IJ SOLN
5000.0000 [IU] | Freq: Three times a day (TID) | INTRAMUSCULAR | Status: DC
  Administered 2014-12-25 – 2015-01-05 (×29): 5000 [IU] via SUBCUTANEOUS
  Filled 2014-12-25 (×27): qty 1

## 2014-12-25 NOTE — Progress Notes (Signed)
OT Cancellation Note  Patient Details Name: Darius Barber MRN: [id number] DOB: 11-Jan-1932   Cancelled Treatment:    Reason Eval/Treat Not Completed: Medical issues which prohibited therapy - pt sedated and on vent.   Darlina Rumpf [place], OTR/L [id number]  12/25/2014, 11:15 AM

## 2014-12-25 NOTE — Progress Notes (Signed)
STROKE TEAM PROGRESS NOTE   HISTORY Darius Barber is an 81 y.o. male transfer from [facility] after presenting with acute onset of right sided weakness and aphasia. Evaluated by specialists on call in ED (at [facility]) and given tPA for NIHSS of 20. Transferred to [facility] after tele-neurologist suggested IR evaluation. Per ED notes, patient was last seen normal at 9:00 PM on 12/20/14, ~45 minutes after wife noted abnormal breathing and reported "flexing movements of left arm". Afterwards, patient was non-verbal, not moving his right side. ED notes mentioned a left gaze preference. CT head imaging reviewed, shows no acute process, signs of an old right MCA infarct. ED physician notes patient has history of fully blocked right ICA and 90% stenosis of L ICA.   Cerebral angiogram performed by Dr. Estanislado Pandy on 12/21/2014, no intervention, no acute findings.   Date last known well: 11/05 Time last known well: 2100 tPA Given: yes Modified Rankin: Rankin Score=0   SUBJECTIVE (INTERVAL HISTORY) Patient sedated this AM, no significant change in neuro exam. CXR with no significant change, although did diurese well with Lasix. No significant overnight events.   OBJECTIVE Temp:  [98.9 F (37.2 C)-101 F (38.3 C)] 98.9 F (37.2 C) (11/10 0413) Pulse Rate:  [63-93] 83 (11/10 0700) Cardiac Rhythm:  [-] Normal sinus rhythm (11/09 2000) Resp:  [15-27] 22 (11/10 0700) BP: (96-198)/(56-94) 160/73 mmHg (11/10 0700) SpO2:  [87 %-98 %] 96 % (11/10 0700) Arterial Line BP: (97-117)/(73-99) 102/93 mmHg (11/09 1000) FiO2 (%):  [50 %-60 %] 50 % (11/10 0437) Weight:  [203 lb 0.7 oz (92.1 kg)] 203 lb 0.7 oz (92.1 kg) (11/10 0133)  CBC:   Recent Labs Lab 12/24/14 0314 12/25/14 0247  WBC 9.3 9.8  NEUTROABS 7.8* 7.9*  HGB 10.3* 11.6*  HCT 32.2* 36.9*  MCV 88.5 88.7  PLT 123* 137*    Basic Metabolic Panel:   Recent Labs Lab 12/21/14 0409  12/23/14 0250  12/24/14 0314 12/25/14 0247  NA  --   < >  143  < > 144 143  K  --   < > 4.0  < > 3.9 2.9*  CL  --   < > 110  < > 112* 108  CO2  --   < > 24  < > 24 26  GLUCOSE  --   < > 120*  < > 179* 157*  BUN  --   < > 25*  < > 37* 40*  CREATININE  --   < > 1.91*  < > 1.83* 2.01*  CALCIUM  --   < > 8.5*  < > 8.3* 8.7*  MG 1.6*  --  1.5*  --  2.2 2.0  PHOS 3.3  --  2.5  --   --   --   < > = values in this interval not displayed.  Lipid Panel:     Component Value Date/Time   CHOL 129 12/21/2014 0410   TRIG 152* 12/25/2014 0247   HDL 27* 12/21/2014 0410   CHOLHDL 4.8 12/21/2014 0410   VLDL 23 12/21/2014 0410   LDLCALC 79 12/21/2014 0410   Urine Drug Screen:     Component Value Date/Time   LABOPIA NONE DETECTED 12/21/2014 0500   COCAINSCRNUR NONE DETECTED 12/21/2014 0500   LABBENZ POSITIVE* 12/21/2014 0500   AMPHETMU NONE DETECTED 12/21/2014 0500   THCU NONE DETECTED 12/21/2014 0500   LABBARB NONE DETECTED 12/21/2014 0500      IMAGING  Ct Abdomen Wo Contrast 12/21/2014  No evidence of significant hemorrhage within the chest, abdomen or pelvis. Confluent airspace consolidation in the posterior lower lobes. This could represent infectious infiltrate. Interstitial and alveolar edema is probably pulse present bilaterally.   Ct Head Wo Contrast 12/21/2014   No acute intracranial process.  Old RIGHT Chronic changes including old RIGHT MCA territory infarct and old LEFT basal ganglia lacunar infarcts. Findings of normal pressure hydrocephalus are similar.   Ct Chest Wo Contrast 12/21/2014   No evidence of significant hemorrhage within the chest, abdomen or pelvis. Confluent airspace consolidation in the posterior lower lobes. This could represent infectious infiltrate. Interstitial and alveolar edema is probably pulse present bilaterally.   Dg Chest Port 1 View 12/21/2014   Satisfactory ET tube position. No other significant interval change.  CXR 1 view 12/24/14 Worsening bilateral airspace opacification may be due to  edema. Pneumonia is not excluded.   MRI Brain 12/21/14 1. Left corona radiata 2.5 cm infarct without hemorrhage or mass effect. 2. No other acute intracranial abnormality. Chronic ischemic disease. Chronic right ICA occlusion. 3. Vertex scalp hematoma.  PHYSICAL EXAM  General: NAD. On ventilator. Follows most commands on the left although slowly.   HEENT: PERRL, EOMI. Moist mucus membranes. OETT/OGT in place.  Neck: Supple, no lymphadenopathy or carotid bruits Lungs: Air entry equal bilaterally, scattered coarse rhonchi. No wheezes.  Heart: RRR, no murmurs, gallops, or rubs Abdomen: Soft, mildly distended, BS + Extremities: No cyanosis, clubbing, or edema  Neurological Exam  Mental Status - Following most commands on the right  Cranial Nerves II - XII - II - Unable to assess III, IV, VI - Extraocular movements intact with some right-sided neglect. Blinks/closes eyes to command V - Unable to assess. VII - Unable to assess. VIII - Hearing impaired X - Gag intact. XI - Unable to assess. XII - Unable to assess.  Motor Strength - Strength intact in left upper extremity, RLE tone intact. RUE 0/5 strength, RLE 1/5 strength. Motor Tone - Muscle tone was assessed at the neck and appendages and was normal.   Reflexes - The patient's reflexes were 1+ in all extremities and he had no pathological reflexes.  Sensory - Unable to assess  Coordination - Unable to assess  Gait and Station - Unable to assess   ASSESSMENT/PLAN Darius Barber is a 81 y.o. male with history of previous CVA, HLD, h/o Colon CA, HTN, CAD, right carotid artery occlusion, vascular disease, history of a left carotid stent, and renal insufficiency, presenting with aphasia and right-sided weakness. He received IV t-PA at [facility] for NIHSS of 20.  Stroke:  Dominant Left Corona Radiata ischemic infarct likely 2/2 small vessel disease  Resultant  Left hemiplegia, aphasia  MRI: Left corona radiata  2.5 cm infarct without hemorrhage or mass effect.  Catheter Angiogram: Angiographically occluded right internal carotid artery and the left  external carotid artery. Mild intrastent stenosis of the left internal carotid artery.  Carotid Doppler (10/24/2014): Known right internal carotid artery occlusion, patent left internal  carotid artery with stent 1-39%. Will obtain report from catheter angiogram.  2D Echo: EF 55-60%. PAP 65 mm Hg  EEG: Moderate diffuse slowing   LDL - 79  HgbA1c 6.2  VTE prophylaxis - SCDs, Lovenox 40 mg SQ qd Diet NPO time specified  aspirin 81 mg daily and clopidogrel 75 mg daily prior to admission, both restarted.   Ongoing aggressive stroke risk factor management  Therapy recommendations:  SNF  Disposition: Pending  Troponin Elevation  Patient with troponin peak ~2, most likely related to demand ischemia 2/2 stroke and pneumonia.  Continue to monitor   Aspiration Pneumonia:  Management per PCCM. Improved vent needs today.    Given Lasix yesterday with 3.5L out over past 24 hours, although mild increase in Cr  Hold off on further diuresis  Continue Unasyn  Acute on CKD  Baseline cr closer to 1.4, increased overnight likely 2/2 Lasix administration.   Continue to monitor  Hypothyroidism  Synthroid IV  Hypertension  Stable  Cardene gtt for SBP >180  Hyperlipidemia  Home meds:  Lipitor 80 mg daily not resumed in hospital. NPO  LDL 79, goal < 70  Add Lipitor 80 mg daily when taking PO  Continue statin at discharge  Other Stroke Risk Factors  Advanced age  Cigarette smoker, quit smoking 35 years ago   Hx stroke/TIA  Coronary artery disease   Patient with no significant neurological changes on exam. Diuresed well overnight, although now with increased Cr. Suspect respiratory issues are more 2/2 multifocal pneumonia. Improved O2 requirements overnight, now on 40% FiO2. No changes in neurological plan for today.       Natasha Bence, MD PGY-3, Internal Medicine Pager: [phone]   To contact Stroke Continuity provider, please refer to [email or web address] After hours, contact General Neurology

## 2014-12-25 NOTE — Progress Notes (Signed)
[place] Progress Note Patient Name: Darius Barber DOB: Feb 12, 1932 MRN: [id number]   Date of Service  12/25/2014  HPI/Events of Note    eICU Interventions  Potassium repletion     Intervention Category Intermediate Interventions: Electrolyte abnormality - evaluation and management  Merton Border 12/25/2014, 4:24 AM

## 2014-12-25 NOTE — Progress Notes (Signed)
Pt remains on ventilator; met with pt's daughter at bedside.  Offered emotional support, introduced myself and explained case management role in discharge planning.  Explained that PT/OT would evaluate pt once extubated and pt able to tolerate therapy.  Daughter concerned, as she knows her dad needs extensive rehab.  She states that her mother has expressed that she does not want pt to go to skilled nursing facility, if at all possible.  Discussed option of possible inpatient rehab, should pt meet eligibility criteria.  Will continue to follow progress; pt will need PT/OT and ST evals once extubated and medically stable for therapies.    Reinaldo Raddle, RN, BSN  Trauma/Neuro ICU Case Manager [phone]

## 2014-12-25 NOTE — Progress Notes (Signed)
PULMONARY  / [place]   Name: Darius Barber MRN: [id number] DOB: Jun 03, 1931    ADMISSION DATE:  12/21/2014 CONSULTATION DATE: 12/21/2014  REQUESTING CLINICIAN: Dr. Leonie Man PRIMARY SERVICE: Neurology  CHIEF COMPLAINT:  Stroke  BRIEF PATIENT DESCRIPTION: 49 M transferred from [facility] for VIR evaluation following presumed CVA. GCS 15 on arrival to NSICU but developed AMS with GCS of 7. PCCM consulted for intubation and vent management.  SIGNIFICANT EVENTS: 11/6 - Admitted to hospital  STUDIES:  Port CXR 11/7 - ETT 4.4cm above carina. No focal opacity. CT Head 11/7 - Left corona radiata infarct. No hemorrhage. Old infarcts.  TTE 11/7 - LV normal in size. Mild LVH. EF 55-60%. LA normal in size. RA normal in size. RV normal in size and function. No pericardial effusion. Port CXR 11/9 - ETT good position. Developing bilateral alveolar opacities. No pleural effusion. Port CXR 11/10 - ETT 3cm above carina. Patchy opacities persist.  LINES / TUBES: ETT 11/6  CULTURES: Tracheal Aspirate (11/6):  Oral Flora  ANTIBIOTICS: Unasyn 11/6>>  HISTORY OF PRESENT ILLNESS:  Darius Barber is an 78 M with prior CVA, CAD, carotid stenosis who presented to [facility] today for stroke-like symptoms. Patient is currently altered and unable to provide any history. He went to VIR on arrival with a normal angiogram. Shortly after arrival to NSICU patient became altered with GCS of 7. PCCM called for emergent intubation.   SUBJECTIVE: Persistent secretions. Improving oxygen requirement.  REVIEW OF SYSTEMS:  Unable to obtain secondary to patient's intubation.  VITAL SIGNS: Temp:  [98.9 F (37.2 C)-99.9 F (37.7 C)] 99 F (37.2 C) (11/10 1300) Pulse Rate:  [63-93] 66 (11/10 1600) Resp:  [15-27] 15 (11/10 1600) BP: (85-175)/(48-94) 109/56 mmHg (11/10 1600) SpO2:  [91 %-97 %] 94 % (11/10 1600) FiO2 (%):  [35 %-50 %] 35 % (11/10 1525) Weight:  [92.1 kg (203 lb 0.7  oz)] 92.1 kg (203 lb 0.7 oz) (11/10 0133) HEMODYNAMICS:   VENTILATOR SETTINGS: Vent Mode:  [-] PRVC FiO2 (%):  [35 %-50 %] 35 % Set Rate:  [15 bmp] 15 bmp Vt Set:  [600 mL] 600 mL PEEP:  [10 cmH20] 10 cmH20 Plateau Pressure:  [25 cmH20-31 cmH20] 25 cmH20 INTAKE / OUTPUT: Intake/Output      11/09 0701 - 11/10 0700 11/10 0701 - 11/11 0700   I.V. (mL/kg) 751 (8.2) 273.8 (3)   NG/GT 1390 500   IV Piggyback 300 200   Total Intake(mL/kg) 2441 (26.5) 973.8 (10.6)   Urine (mL/kg/hr) 3500 (1.6) 1000 (1)   Emesis/NG output 0 (0)    Stool     Total Output 3500 1000   Net -1059 -26.2          PHYSICAL EXAMINATION: General:  Obese male. Sedated but opens eyes. Daughter at bedside again today.  Integument:  Warm & dry. No rash on exposed skin.  HEENT:  No scleral injection. Endotracheal tube in place. Moist mucus membranes. Cardiovascular:  Regular rate & rhythm. Unable to appreciate JVD given body habitus.  Pulmonary:  Coarse breath sounds bilaterally. Thick, tan secretions, Symmetric chest wall rise on ventilator. Abdomen: Soft. Normal bowel sounds. Protuberant. Neurological: Spontaneously moving left upper & lower extremities. Not following commands on sedation. Eyes open. Right lower extremity Babinski.  LABS:  CBC  Recent Labs Lab 12/23/14 0250 12/24/14 0314 12/25/14 0247  WBC 13.1* 9.3 9.8  HGB 11.3* 10.3* 11.6*  HCT 36.9* 32.2* 36.9*  PLT 134* 123* 137*  Coag's  Recent Labs Lab 12/21/14 0255  INR 1.18   BMET  Recent Labs Lab 12/23/14 0750 12/24/14 0314 12/25/14 0247  NA 144 144 143  K 3.6 3.9 2.9*  CL 108 112* 108  CO2 23 24 26   BUN 24* 37* 40*  CREATININE 1.88* 1.83* 2.01*  GLUCOSE 116* 179* 157*   Electrolytes  Recent Labs Lab 12/21/14 0409  12/23/14 0250 12/23/14 0750 12/24/14 0314 12/25/14 0247  CALCIUM  --   < > 8.5* 8.5* 8.3* 8.7*  MG 1.6*  --  1.5*  --  2.2 2.0  PHOS 3.3  --  2.5  --   --   --   < > = values in this interval not  displayed. Sepsis Markers  Recent Labs Lab 12/21/14 0255  LATICACIDVEN 0.9   ABG  Recent Labs Lab 12/21/14 1005  PHART 7.437  PCO2ART 36.9  PO2ART 106*   Liver Enzymes  Recent Labs Lab 12/21/14 0255 12/22/14 0316 12/23/14 0250  AST 17 23  --   ALT 13* 11*  --   ALKPHOS 60 51  --   BILITOT 0.5 0.9  --   ALBUMIN 3.0* 2.6* 2.6*   Cardiac Enzymes  Recent Labs Lab 12/23/14 2105 12/24/14 0314 12/24/14 1005  TROPONINI 1.84* 1.92* 0.90*   Glucose  Recent Labs Lab 12/24/14 1526 12/24/14 2039 12/25/14 0024 12/25/14 0411 12/25/14 1327 12/25/14 1640  GLUCAP 141* 122* 124* 164* 157* 140*    Imaging Dg Chest Port 1 View  12/25/2014  CLINICAL DATA:  Aspiration pneumonia EXAM: PORTABLE CHEST 1 VIEW COMPARISON:  12/24/2014 FINDINGS: Endotracheal tube terminates 3 cm above the carina. Low lung volumes. Multifocal patchy opacities. Possible small right pleural effusion. No pneumothorax. The heart is top-normal in size. Postsurgical changes related to prior CABG. Median sternotomy. IMPRESSION: Endotracheal tube terminates 3 cm above the carina. Low lung volumes. Multifocal patchy opacities, suspicious for multifocal pneumonia, possibly related to aspiration. Electronically Signed   By: Julian Hy M.D.   On: 12/25/2014 10:21   Dg Chest Port 1 View  12/24/2014  CLINICAL DATA:  Respiratory distress, patient on ventilator. Respiratory failure. EXAM: PORTABLE CHEST 1 VIEW COMPARISON:  12/22/2014 and CT chest 12/21/2014. FINDINGS: Endotracheal tube terminates 2.7 cm above the carina. Nasogastric tube is followed into the stomach. Heart size is grossly stable. Lungs are low in volume with worsening bilateral airspace opacification. No definite pleural fluid. IMPRESSION: Worsening bilateral airspace opacification may be due to edema. Pneumonia is not excluded. Electronically Signed   By: Lorin Picket M.D.   On: 12/24/2014 08:17    Labwork from [place].  Na 141 K 3.9 Cl 102 Co2 30.1 BUN 25 Cr 1.83 Glu 136 Ca 8.7   WBC 8.3 HgB 13.4 Hct 43.3 PLT 141  ASSESSMENT / PLAN: 81 year old male with left corona radiata infarct and altered mentation requiring intubation for airway protection. Aspiration pneumonia seems to be progressing with likely some element of underlying pulmonary edema contributing. Patient's mild elevation in creatinine more indicative of prerenal volume status. Not attempting further diuresis with Lasix today. Oxygen requirement is improving with airway clearance. Continuing treatment for aspiration pneumonia. Daughter updated at bedside today regarding improvement of FiO2 but continued guarded status.  1. Left corona radiata CVA: Management per neurology. Aspirin, Plavix, & Lipitor. 2. Acute hypoxic respiratory failure: Likely secondary  to aspiration pneumonia. Continuing to wean FiO2 to maintain saturation greater than 94%. Plan for SBT once patient's respiratory status improves. Albuterol nebs q6hr,  guaifenesin via tube every 6 hours, & chest PT via bed every 4 hours  to help with secretion clearance. Avoiding further diuresis given elevation in creatinine.  3. Altered mental status:  Stable. Likely secondary to CVA as well as sedation requirement while on ventilator.  4. Aspiration pneumonia: Continuing on Unasyn day #4/10. Tracheal aspirate culture showed only oral floor consistent with aspiration. Continuing mucolytic & airway clearance. 5. H/O CAD S/P CABG/ICM:  Appreciate cardiology recommendations. Likely stress/demand ischemia. Echocardiogram without mention of wall motion abnormality. On Plavix, Lipitor, & ASA.  6. Acute renal failure:  Mild worsening with attempted diuresis yesterday. Continuing to trend renal function with daily BUN/creatinine. Also monitoring urine output with Foley catheter. 7. Hypokalemia: Replaced with potassium chloride via tube. Repeat electrolytes in a.m. 8. Anemia: Improving &  mild. No signs of active bleeding. Continuing to trend hemoglobin daily with CBC. 9. Thrombocytopenia: Improving & mild. Continue to trend hemoglobin daily with CBC. 10. Hyperglycemia: Hemoglobin A1c 6.2. Accu-Cheks every 4 hours with low-dose sliding scale coverage. 11. Hypomagnesemia:  Resolved. Monitoring magnesium daily with labs. 12. Hypertension: Plan to initiate Cardene drip for systolic blood pressure greater than 180 mmHg.  13. History of Hyperlipidemia: Lipitor 80mg  via tube qhs. 14. History of Hypothyroidism:  Continuing levothyroxine 150 g via tube daily. 15. Diet: Continuing tube feeds per dietary recommendations.  16. Prophylaxis: SCDs, Protonix IV daily, & switching from Lovenox to heparin subcutaneous every 8 hours given his acute renal failure.   I have spent a total of 35 minutes of critical care time today caring for the patient, updating the patient's daughter at bedside, & reviewing the patient's electronic medical record.  Sonia Baller Ashok Cordia, M.D. [facility] Pulmonary & Critical Care Pager:  [phone] After 3pm or if no response, call [phone] 12/25/2014, 5:40 PM

## 2014-12-25 NOTE — Progress Notes (Signed)
PT Cancellation Note  Patient Details Name: TREMAR DUPLANTIER MRN: [id number] DOB: 09-25-1931   Cancelled Treatment:    Reason Eval/Treat Not Completed: Medical issues which prohibited therapy.  Pt sedated at this time.  Will hold PT and check back as appropriate.     Mehar Kirkwood, Thornton Papas 12/25/2014, 9:07 AM

## 2014-12-26 ENCOUNTER — Inpatient Hospital Stay (HOSPITAL_COMMUNITY): Payer: Medicare HMO

## 2014-12-26 DIAGNOSIS — E876 Hypokalemia: Secondary | ICD-10-CM

## 2014-12-26 DIAGNOSIS — E870 Hyperosmolality and hypernatremia: Secondary | ICD-10-CM

## 2014-12-26 LAB — BASIC METABOLIC PANEL
ANION GAP: 8 (ref 5–15)
BUN: 47 mg/dL — ABNORMAL HIGH (ref 6–20)
CALCIUM: 9.2 mg/dL (ref 8.9–10.3)
CHLORIDE: 115 mmol/L — AB (ref 101–111)
CO2: 28 mmol/L (ref 22–32)
Creatinine, Ser: 1.79 mg/dL — ABNORMAL HIGH (ref 0.61–1.24)
GFR calc non Af Amer: 34 mL/min — ABNORMAL LOW (ref >60)
GFR, EST AFRICAN AMERICAN: 39 mL/min — AB (ref >60)
Glucose, Bld: 164 mg/dL — ABNORMAL HIGH (ref 65–99)
POTASSIUM: 3.6 mmol/L (ref 3.5–5.1)
Sodium: 151 mmol/L — ABNORMAL HIGH (ref 135–145)

## 2014-12-26 LAB — RENAL FUNCTION PANEL
ALBUMIN: 2.3 g/dL — AB (ref 3.5–5.0)
ANION GAP: 9 (ref 5–15)
BUN: 49 mg/dL — AB (ref 6–20)
CHLORIDE: 113 mmol/L — AB (ref 101–111)
CO2: 29 mmol/L (ref 22–32)
Calcium: 8.9 mg/dL (ref 8.9–10.3)
Creatinine, Ser: 2.07 mg/dL — ABNORMAL HIGH (ref 0.61–1.24)
GFR calc Af Amer: 33 mL/min — ABNORMAL LOW (ref >60)
GFR calc non Af Amer: 28 mL/min — ABNORMAL LOW (ref >60)
GLUCOSE: 163 mg/dL — AB (ref 65–99)
POTASSIUM: 3.1 mmol/L — AB (ref 3.5–5.1)
Phosphorus: 2.7 mg/dL (ref 2.5–4.6)
Sodium: 151 mmol/L — ABNORMAL HIGH (ref 135–145)

## 2014-12-26 LAB — GLUCOSE, CAPILLARY
GLUCOSE-CAPILLARY: 136 mg/dL — AB (ref 65–99)
GLUCOSE-CAPILLARY: 141 mg/dL — AB (ref 65–99)
Glucose-Capillary: 147 mg/dL — ABNORMAL HIGH (ref 65–99)
Glucose-Capillary: 152 mg/dL — ABNORMAL HIGH (ref 65–99)
Glucose-Capillary: 137 mg/dL — ABNORMAL HIGH (ref 65–99)
Glucose-Capillary: 146 mg/dL — ABNORMAL HIGH (ref 65–99)
Glucose-Capillary: 140 mg/dL — ABNORMAL HIGH (ref 65–99)

## 2014-12-26 LAB — CBC WITH DIFFERENTIAL/PLATELET
BASOS ABS: 0 10*3/uL (ref 0.0–0.1)
Basophils Relative: 0 %
EOS PCT: 2 %
Eosinophils Absolute: 0.1 10*3/uL (ref 0.0–0.7)
HEMATOCRIT: 33.6 % — AB (ref 39.0–52.0)
Hemoglobin: 10.4 g/dL — ABNORMAL LOW (ref 13.0–17.0)
LYMPHS PCT: 9 %
Lymphs Abs: 0.7 10*3/uL (ref 0.7–4.0)
MCH: 27.5 pg (ref 26.0–34.0)
MCHC: 31 g/dL (ref 30.0–36.0)
MCV: 88.9 fL (ref 78.0–100.0)
MONO ABS: 0.6 10*3/uL (ref 0.1–1.0)
MONOS PCT: 9 %
NEUTROS ABS: 5.8 10*3/uL (ref 1.7–7.7)
Neutrophils Relative %: 80 %
PLATELETS: 150 10*3/uL (ref 150–400)
RBC: 3.78 MIL/uL — ABNORMAL LOW (ref 4.22–5.81)
RDW: 16.3 % — AB (ref 11.5–15.5)
WBC: 7.2 10*3/uL (ref 4.0–10.5)

## 2014-12-26 LAB — MAGNESIUM: Magnesium: 2.2 mg/dL (ref 1.7–2.4)

## 2014-12-26 LAB — TRIGLYCERIDES: Triglycerides: 195 mg/dL — ABNORMAL HIGH (ref <150)

## 2014-12-26 MED ORDER — FREE WATER
100.0000 mL | Freq: Three times a day (TID) | Status: DC
  Administered 2014-12-26 – 2014-12-28 (×7): 100 mL

## 2014-12-26 MED ORDER — POTASSIUM CHLORIDE 20 MEQ/15ML (10%) PO SOLN
60.0000 meq | Freq: Once | ORAL | Status: AC
  Administered 2014-12-26: 60 meq
  Filled 2014-12-26: qty 45

## 2014-12-26 NOTE — Progress Notes (Signed)
STROKE TEAM PROGRESS NOTE   HISTORY Darius Barber is an 81 y.o. male transfer from [facility] after presenting with acute onset of right sided weakness and aphasia. Evaluated by specialists on call in ED (at [facility]) and given tPA for NIHSS of 20. Transferred to [facility] after tele-neurologist suggested IR evaluation. Per ED notes, patient was last seen normal at 9:00 PM on 12/20/14, ~45 minutes after wife noted abnormal breathing and reported "flexing movements of left arm". Afterwards, patient was non-verbal, not moving his right side. ED notes mentioned a left gaze preference. CT head imaging reviewed, shows no acute process, signs of an old right MCA infarct. ED physician notes patient has history of fully blocked right ICA and 90% stenosis of L ICA.   Cerebral angiogram performed by Dr. Estanislado Pandy on 12/21/2014, no intervention, no acute findings.   Date last known well: 11/05 Time last known well: 2100 tPA Given: yes Modified Rankin: Rankin Score=0   SUBJECTIVE (INTERVAL HISTORY) More alert this AM, no significant change in neuro status.   OBJECTIVE Temp:  [98.3 F (36.8 C)-99.9 F (37.7 C)] 99.2 F (37.3 C) (11/11 0416) Pulse Rate:  [60-100] 81 (11/11 0726) Cardiac Rhythm:  [-] Normal sinus rhythm (11/10 2000) Resp:  [15-25] 18 (11/11 0726) BP: (81-189)/(48-139) 180/89 mmHg (11/11 0726) SpO2:  [90 %-100 %] 92 % (11/11 0726) FiO2 (%):  [35 %-40 %] 40 % (11/11 0726) Weight:  [202 lb 6.1 oz (91.8 kg)] 202 lb 6.1 oz (91.8 kg) (11/11 0500)  CBC:   Recent Labs Lab 12/25/14 0247 12/26/14 0517  WBC 9.8 7.2  NEUTROABS 7.9* 5.8  HGB 11.6* 10.4*  HCT 36.9* 33.6*  MCV 88.7 88.9  PLT 137* [id number]    Basic Metabolic Panel:   Recent Labs Lab 12/23/14 0250  12/25/14 0247 12/26/14 0517  NA 143  < > 143 151*  K 4.0  < > 2.9* 3.1*  CL 110  < > 108 113*  CO2 24  < > 26 29  GLUCOSE 120*  < > 157* 163*  BUN 25*  < > 40* 49*  CREATININE 1.91*  < > 2.01* 2.07*  CALCIUM 8.5*  < >  8.7* 8.9  MG 1.5*  < > 2.0 2.2  PHOS 2.5  --   --  2.7  < > = values in this interval not displayed.  Lipid Panel:     Component Value Date/Time   CHOL 129 12/21/2014 0410   TRIG 195* 12/26/2014 0517   HDL 27* 12/21/2014 0410   CHOLHDL 4.8 12/21/2014 0410   VLDL 23 12/21/2014 0410   LDLCALC 79 12/21/2014 0410   Urine Drug Screen:     Component Value Date/Time   LABOPIA NONE DETECTED 12/21/2014 0500   COCAINSCRNUR NONE DETECTED 12/21/2014 0500   LABBENZ POSITIVE* 12/21/2014 0500   AMPHETMU NONE DETECTED 12/21/2014 0500   THCU NONE DETECTED 12/21/2014 0500   LABBARB NONE DETECTED 12/21/2014 0500      IMAGING  Ct Abdomen Wo Contrast 12/21/2014   No evidence of significant hemorrhage within the chest, abdomen or pelvis. Confluent airspace consolidation in the posterior lower lobes. This could represent infectious infiltrate. Interstitial and alveolar edema is probably pulse present bilaterally.   Ct Head Wo Contrast 12/21/2014   No acute intracranial process.  Old RIGHT Chronic changes including old RIGHT MCA territory infarct and old LEFT basal ganglia lacunar infarcts. Findings of normal pressure hydrocephalus are similar.   Ct Chest Wo Contrast 12/21/2014   No evidence  of significant hemorrhage within the chest, abdomen or pelvis. Confluent airspace consolidation in the posterior lower lobes. This could represent infectious infiltrate. Interstitial and alveolar edema is probably pulse present bilaterally.   Dg Chest Port 1 View 12/21/2014   Satisfactory ET tube position. No other significant interval change.  CXR 1 view 12/24/14 Worsening bilateral airspace opacification may be due to edema. Pneumonia is not excluded.   MRI Brain 12/21/14 1. Left corona radiata 2.5 cm infarct without hemorrhage or mass effect. 2. No other acute intracranial abnormality. Chronic ischemic disease. Chronic right ICA occlusion. 3. Vertex scalp hematoma.  PHYSICAL EXAM  General: NAD.  On ventilator. Follows most commands on the left.   HEENT: PERRL, EOMI. Moist mucus membranes. OETT/OGT in place.  Neck: Supple, no lymphadenopathy or carotid bruits Lungs: Air entry equal bilaterally, scattered coarse rhonchi. No wheezes.  Heart: RRR, no murmurs, gallops, or rubs Abdomen: Soft, mildly distended, BS + Extremities: No cyanosis, clulops, or rubs Abdomen: Soft, mildly distendbbing, or edema  Neurological Exam  Mental Status - Following most commands on the left  Cranial Nerves II - XII - II - Unable to assess III, IV, VI - Extraocular movements intact with some right-sided neglect. Blinks/closes eyes to command V - Unable to assess. VII - Unable to assess. VIII - Hearing impaired X - Gag intact. XI - Unable to assess. XII - Unable to assess.  Motor Strength - Strength intact in LUE and LLE, RLE tone intact. RUE 0/5 strength, RLE 2/5 strength. Motor Tone - Muscle tone was assessed at the neck and appendages and was normal.   Reflexes - The patient's reflexes were 1+ in all extremities and he had no pathological reflexes.  Sensory - Unable to assess  Coordination - Unable to assess  Gait and Station - Unable to assess   ASSESSMENT/PLAN Mr. Darius Barber is a 81 y.o. male with history of previous CVA, HLD, h/o Colon CA, HTN, CAD, right carotid artery occlusion, vascular disease, history of a left carotid stent, and renal insufficiency, presenting with aphasia and right-sided weakness. He received IV t-PA at [facility] for NIHSS of 20.  Stroke:  Dominant Left Corona Radiata ischemic infarct likely 2/2 small vessel disease  Resultant  Left hemiplegia, aphasia  MRI: Left corona radiata 2.5 cm infarct without hemorrhage or mass effect.  Catheter Angiogram: Angiographically occluded right internal carotid artery and the left  external carotid artery. Mild intrastent stenosis of the left internal carotid artery.  Carotid Doppler (10/24/2014): Known right  internal carotid artery occlusion, patent left internal  carotid artery with stent 1-39%. Will obtain report from catheter angiogram.  2D Echo: EF 55-60%. PAP 65 mm Hg  EEG: Moderate diffuse slowing   LDL - 79  HgbA1c 6.2  VTE prophylaxis - SCDs, Lovenox 40 mg SQ qd Diet NPO time specified  aspirin 81 mg daily and clopidogrel 75 mg daily prior to admission, both restarted.   Ongoing aggressive stroke risk factor management  Therapy recommendations:  SNF  Disposition: Pending  Aspiration Pneumonia:  Management per PCCM.    Continue Unasyn  Hypernatremia  Most likely related to diuresis. Free water deficit ~4L.   Add free water per OGT  Acute on CKD  Baseline Cr closer to 1.4, 2.07 this AM.   Continue to monitor  Hypothyroidism  Synthroid IV  Hypertension  Stable  Cardene gtt for SBP >180  Hyperlipidemia  Home meds:  Lipitor 80 mg daily not resumed in hospital. NPO  LDL 79,  goal < 70  Add Lipitor 80 mg daily when taking PO  Continue statin at discharge  Other Stroke Risk Factors  Advanced age  Cigarette smoker, quit smoking 35 years ago   Hx stroke/TIA  Coronary artery disease   No significant change in neuro exam. Hopeful for extubation soon. Wife updated at bedside.      Natasha Bence, MD PGY-3, Internal Medicine Pager: [phone]  Personally examined patient and images, and have participated in and made any corrections needed to history, physical, neuro exam,assessment and plan as stated above.  I have personally obtained the history, evaluated lab date, reviewed imaging studies and agree with radiology interpretations.  I also discussed case with wife at bedside. Neurologic exam is stable, continue to follow simple central and peripheral commands on the left, but slow in response. Right hemiplegia. Still not able to wean off vent due to likely aspiration pneumonia. Cont antibiotics. Monitor renal function, free water per above. Appreciate  management by pccm.  This patient is critically ill due to respiratory failure, cerebral infarction, right ICA occlusion and at significant risk of neurological worsening, death form recurrent infarction, and cardiac combinations. This patient's care requires constant monitoring of vital signs, hemodynamics, respiratory and cardiac monitoring, review of multiple databases, neurological assessment, discussion with family, other specialists and medical decision making of high complexity. I spent 35 minutes of neurocritical care time in the care of this patient.  Sarina Ill, MD Stroke Neurology [phone] [facility]       To contact Stroke Continuity provider, please refer to [email or web address] After hours, contact General Neurology

## 2014-12-26 NOTE — Progress Notes (Signed)
Small amount of purulent drainage noted at insertion site of R hand PIV. Site noted to be moist under dressing. Site cleansed well with chloroprep and IV removed after restarting new IV in LFA. No further drainage noted. MD made aware of drainage.

## 2014-12-26 NOTE — Progress Notes (Signed)
After RN administered potassium & water via OGT, pt noted to cough/gurgle. Small amount of orange colored secretions came from mouth. OGT advanced further and placement reconfirmed via auscultation. MD made aware,  chest xray & KUB ordered. Will continue to monitor.

## 2014-12-26 NOTE — Progress Notes (Signed)
PULMONARY  / [place]   Name: Darius Barber MRN: [id number] DOB: [birth year]    ADMISSION DATE:  12/21/2014 CONSULTATION DATE: 12/21/2014  REQUESTING CLINICIAN: Dr. Leonie Man PRIMARY SERVICE: Neurology  CHIEF COMPLAINT:  Stroke  BRIEF PATIENT DESCRIPTION: 42 M transferred from [facility] for VIR evaluation following presumed CVA. GCS 15 on arrival to NSICU but developed AMS with GCS of 7. PCCM consulted for intubation and vent management.  SIGNIFICANT EVENTS: 11/6 - Admitted to hospital  STUDIES:  Port CXR 11/7 - ETT 4.4cm above carina. No focal opacity. CT Head 11/7 - Left corona radiata infarct. No hemorrhage. Old infarcts.  TTE 11/7 - LV normal in size. Mild LVH. EF 55-60%. LA normal in size. RA normal in size. RV normal in size and function. No pericardial effusion. Port CXR 11/9 - ETT good position. Developing bilateral alveolar opacities. No pleural effusion. Port CXR 11/10 - ETT 3cm above carina. Patchy opacities persist.  LINES / TUBES: ETT 11/6  CULTURES: Tracheal Aspirate (11/6):  Oral Flora  ANTIBIOTICS: Unasyn 11/6>>  HISTORY OF PRESENT ILLNESS:  Darius Barber is an 65 M with prior CVA, CAD, carotid stenosis who presented to [facility] today for stroke-like symptoms. Patient is currently altered and unable to provide any history. He went to VIR on arrival with a normal angiogram. Shortly after arrival to NSICU patient became altered with GCS of 7. PCCM called for emergent intubation.   SUBJECTIVE: Secretions persist. No acute events overnight. Patient did have questionable emesis of liquid potassium earlier this morning. Continues to have thick, tan secretions.  REVIEW OF SYSTEMS:  Unable to obtain secondary to patient's intubation.  VITAL SIGNS: Temp:  [98.3 F (36.8 C)-99.2 F (37.3 C)] 98.8 F (37.1 C) (11/11 0800) Pulse Rate:  [60-100] 81 (11/11 1159) Resp:  [15-25] 21 (11/11 1159) BP: (81-189)/(48-139) 128/68 mmHg (11/11  1159) SpO2:  [90 %-100 %] 92 % (11/11 1159) FiO2 (%):  [35 %-40 %] 40 % (11/11 1159) Weight:  [91.8 kg (202 lb 6.1 oz)] 91.8 kg (202 lb 6.1 oz) (11/11 0500) HEMODYNAMICS:   VENTILATOR SETTINGS: Vent Mode:  [-] PRVC FiO2 (%):  [35 %-40 %] 40 % Set Rate:  [15 bmp] 15 bmp Vt Set:  [600 mL] 600 mL PEEP:  [10 cmH20] 10 cmH20 Plateau Pressure:  [25 cmH20-29 cmH20] 29 cmH20 INTAKE / OUTPUT: Intake/Output      11/10 0701 - 11/11 0700 11/11 0701 - 11/12 0700   I.V. (mL/kg) 644.5 (7) 126 (1.4)   NG/GT 850 150   IV Piggyback 300 100   Total Intake(mL/kg) 1794.5 (19.5) 376 (4.1)   Urine (mL/kg/hr) 1850 (0.8) 500 (1)   Emesis/NG output     Total Output 1850 500   Net -55.5 -124          PHYSICAL EXAMINATION: General:  Intubated. Wife at bedside. No distress.  Integument:  Warm & dry. No rash on exposed skin.  HEENT:  No scleral injection. Endotracheal tube in place.  Cardiovascular:  Regular rate & rhythm. Unable to appreciate JVD given body habitus.  Pulmonary:  Coarse breath sounds bilaterally persist. Thick, tan secretions unchanged. Symmetric chest wall rise on ventilator. Abdomen: Soft. Normal bowel sounds. Protuberant. Neurological: Patient in spontaneously moves left upper extremity. Babinski persistent right lower extremity. Not following commands but again on low-dose sedation.   LABS:  CBC  Recent Labs Lab 12/24/14 0314 12/25/14 0247 12/26/14 0517  WBC 9.3 9.8 7.2  HGB 10.3* 11.6* 10.4*  HCT  32.2* 36.9* 33.6*  PLT 123* 137* 150   Coag's  Recent Labs Lab 12/21/14 0255  INR 1.18   BMET  Recent Labs Lab 12/24/14 0314 12/25/14 0247 12/26/14 0517  NA 144 143 151*  K 3.9 2.9* 3.1*  CL 112* 108 113*  CO2 24 26 29   BUN 37* 40* 49*  CREATININE 1.83* 2.01* 2.07*  GLUCOSE 179* 157* 163*   Electrolytes  Recent Labs Lab 12/21/14 0409  12/23/14 0250  12/24/14 0314 12/25/14 0247 12/26/14 0517  CALCIUM  --   < > 8.5*  < > 8.3* 8.7* 8.9  MG 1.6*  --  1.5*   --  2.2 2.0 2.2  PHOS 3.3  --  2.5  --   --   --  2.7  < > = values in this interval not displayed. Sepsis Markers  Recent Labs Lab 12/21/14 0255  LATICACIDVEN 0.9   ABG  Recent Labs Lab 12/21/14 1005  PHART 7.437  PCO2ART 36.9  PO2ART 106*   Liver Enzymes  Recent Labs Lab 12/21/14 0255 12/22/14 0316 12/23/14 0250 12/26/14 0517  AST 17 23  --   --   ALT 13* 11*  --   --   ALKPHOS 60 51  --   --   BILITOT 0.5 0.9  --   --   ALBUMIN 3.0* 2.6* 2.6* 2.3*   Cardiac Enzymes  Recent Labs Lab 12/23/14 2105 12/24/14 0314 12/24/14 1005  TROPONINI 1.84* 1.92* 0.90*   Glucose  Recent Labs Lab 12/25/14 1327 12/25/14 1640 12/25/14 2011 12/26/14 0020 12/26/14 0411 12/26/14 0744  GLUCAP 157* 140* 139* 147* 152* 137*    Imaging Dg Chest Port 1 View  12/26/2014  CLINICAL DATA:  Evaluate course of aspiration pneumonia. intubated. NG tube reposition. EXAM: PORTABLE CHEST 1 VIEW COMPARISON:  12/25/2014 FINDINGS: NG tube has been advanced into the proximal stomach. Endotracheal soon is 5 cm above the carina. Mild cardiomegaly. Vascular congestion. Patchy bilateral airspace opacities are slightly improved since prior study. No visible effusions. IMPRESSION: Endotracheal tube in stable position. A NG tube advanced into the proximal stomach. Slight improvement in patchy bilateral airspace disease and lung volumes. Electronically Signed   By: Rolm Baptise M.D.   On: 12/26/2014 11:59   Dg Chest Port 1 View  12/25/2014  CLINICAL DATA:  Aspiration pneumonia EXAM: PORTABLE CHEST 1 VIEW COMPARISON:  12/24/2014 FINDINGS: Endotracheal tube terminates 3 cm above the carina. Low lung volumes. Multifocal patchy opacities. Possible small right pleural effusion. No pneumothorax. The heart is top-normal in size. Postsurgical changes related to prior CABG. Median sternotomy. IMPRESSION: Endotracheal tube terminates 3 cm above the carina. Low lung volumes. Multifocal patchy opacities,  suspicious for multifocal pneumonia, possibly related to aspiration. Electronically Signed   By: Julian Hy M.D.   On: 12/25/2014 10:21    Labwork from [place].  Na 141 K 3.9 Cl 102 Co2 30.1 BUN 25 Cr 1.83 Glu 136 Ca 8.7   WBC 8.3 HgB 13.4 Hct 43.3 PLT 141  ASSESSMENT / PLAN: 81 year old male with left corona radiata infarct and altered mentation requiring intubation for airway protection. Slowly improving with regards to FiO2 and oxygen requirement. Patient does have a free water deficit likely contributing to his hypernatremia but given his tenuous respiratory status I'm not initiating aggressive free water replacement. Continuing airway clearance maneuvers at this time. Patient continuing on treatment for aspiration pneumonia. Patient's wife updated at bedside this morning. Status remains critical with a guarded prognosis.  1. Left corona radiata CVA: Management per neurology. Aspirin, Plavix, & Lipitor. 2. Acute hypoxic respiratory failure: Likely secondary  to aspiration pneumonia. Continuing to wean FiO2 to maintain saturation greater than 94%. Plan for SBT once patient's respiratory status improves. Albuterol nebs q6hr, guaifenesin via tube every 6 hours, & chest PT via bed every 4 hours  to help with secretion clearance. Avoiding further diuresis given elevation in creatinine & hypernatremia.  3. Altered mental status:  Stable. Likely secondary to CVA as well as sedation requirement while on ventilator.  4. Aspiration pneumonia: Continuing on Unasyn day #5/10. Tracheal aspirate culture showed only oral floor consistent with aspiration. Continuing mucolytic & airway clearance. 5. H/O CAD S/P CABG/ICM:  Appreciate Cardiology recommendations. Likely stress/demand ischemia. Echocardiogram without mention of wall motion abnormality. On Plavix, Lipitor, & ASA.  6. Acute renal failure:  Stable with good UOP. Continuing to trend renal function with daily  BUN/creatinine. Also monitoring urine output with Foley catheter. 7. Hypernatremia: Initiating gentle free water replacement with 100 mL via tube every 8 hours.  8. Hypokalemia: Replaced with 60 mEq potassium chloride via tube. Repeat electrolytes at 1500 today. 9. Anemia: Mild. No signs of active bleeding. Continuing to trend hemoglobin daily with CBC. 10. Thrombocytopenia: Resolved. Continue to trend hemoglobin daily with CBC. 11. Hyperglycemia: Good blood glucose control. Hemoglobin A1c 6.2. Accu-Cheks every 4 hours with low-dose sliding scale coverage. 12. Hypomagnesemia:  Resolved. Monitoring magnesium daily with labs. 13. Hypertension: Plan to initiate Cardene drip for systolic blood pressure greater than 180 mmHg.  14. History of Hyperlipidemia: Lipitor 80mg  via tube qhs. 15. History of Hypothyroidism:  Continuing levothyroxine 150 g via tube daily. 16. Diet: Continuing tube feeds per dietary recommendations.  17. Prophylaxis: SCDs, Protonix IV daily, & heparin subcutaneous every 8 hours given his acute renal failure.   I have spent a total of 32 minutes of critical care time today caring for the patient, updating the patient's daughter at bedside, & reviewing the patient's electronic medical record.  Sonia Baller Ashok Cordia, M.D. [facility] Pulmonary & Critical Care Pager:  [phone] After 3pm or if no response, call [phone] 12/26/2014, 12:44 PM

## 2014-12-26 NOTE — Progress Notes (Signed)
OT Cancellation Note  Patient Details Name: Darius Barber MRN: [id number] DOB: 1932/01/31   Cancelled Treatment:    Reason Eval/Treat Not Completed: Patient not medically ready Pt sedated and on vent. Will assess when appropriate.  [place], OTR/L  [id number] 12/26/2014 12/26/2014, 4:50 PM

## 2014-12-26 NOTE — Progress Notes (Signed)
After performing patient CPT, when attempting to suction patient, noticed that suction catheter would not pass all the way.  With assistance of RN, bagged lavaged patient.  After bag lavage, was able to pass suction catheter with ease.  Will continue to monitor patient.

## 2014-12-26 NOTE — Code Documentation (Deleted)
  Patient Name: Darius Barber   MRN: [id number]   Date of Birth/ Sex: Aug 08, 1931 , male      Admission Date: 12/21/2014  Attending Provider: Rosalin Hawking, MD  Primary Diagnosis: <principal problem not specified>   Indication: Pt was in his usual state of health until this AM, when he was noted to be vtach. Code blue was subsequently called. At the time of arrival on scene, ACLS protocol was underway.   Technical Description:  - CPR performance duration:  3-4 mins    - Was defibrillation or cardioversion used? Yes   - Was external pacer placed? Yes  - Was patient intubated pre/post CPR? Yes   Medications Administered: Y = Yes; Blank = No Amiodarone    Atropine    Calcium    Epinephrine  y  Lidocaine    Magnesium    Norepinephrine    Phenylephrine    Sodium bicarbonate    Vasopressin     Post CPR evaluation:  - Final Status - Was patient successfully resuscitated ? Yes - What is current rhythm? Sinus tach - What is current hemodynamic status? Ok.  Miscellaneous Information:  - Labs sent, including: Bmet, trop, ekg, lactic acid  - Primary team notified?  Yes  - Family Notified? Yes  - Additional notes/ transfer status: Trauma team is here.     Dellia Nims, MD  12/26/2014, 8:50 AM

## 2014-12-27 ENCOUNTER — Inpatient Hospital Stay (HOSPITAL_COMMUNITY): Payer: Medicare HMO

## 2014-12-27 DIAGNOSIS — I10 Essential (primary) hypertension: Secondary | ICD-10-CM

## 2014-12-27 DIAGNOSIS — Z9911 Dependence on respirator [ventilator] status: Secondary | ICD-10-CM

## 2014-12-27 LAB — BASIC METABOLIC PANEL
Anion gap: 7 (ref 5–15)
BUN: 46 mg/dL — ABNORMAL HIGH (ref 6–20)
CHLORIDE: 118 mmol/L — AB (ref 101–111)
CO2: 28 mmol/L (ref 22–32)
Calcium: 9.3 mg/dL (ref 8.9–10.3)
Creatinine, Ser: 1.76 mg/dL — ABNORMAL HIGH (ref 0.61–1.24)
GFR, EST AFRICAN AMERICAN: 40 mL/min — AB (ref >60)
GFR, EST NON AFRICAN AMERICAN: 35 mL/min — AB (ref >60)
Glucose, Bld: 175 mg/dL — ABNORMAL HIGH (ref 65–99)
POTASSIUM: 3.5 mmol/L (ref 3.5–5.1)
SODIUM: 153 mmol/L — AB (ref 135–145)

## 2014-12-27 LAB — CBC WITH DIFFERENTIAL/PLATELET
BASOS ABS: 0 10*3/uL (ref 0.0–0.1)
BASOS PCT: 0 %
Eosinophils Absolute: 0.2 10*3/uL (ref 0.0–0.7)
Eosinophils Relative: 3 %
HEMATOCRIT: 34.1 % — AB (ref 39.0–52.0)
Hemoglobin: 10.5 g/dL — ABNORMAL LOW (ref 13.0–17.0)
Lymphocytes Relative: 9 %
Lymphs Abs: 0.6 10*3/uL — ABNORMAL LOW (ref 0.7–4.0)
MCH: 27.8 pg (ref 26.0–34.0)
MCHC: 30.8 g/dL (ref 30.0–36.0)
MCV: 90.2 fL (ref 78.0–100.0)
MONO ABS: 0.5 10*3/uL (ref 0.1–1.0)
Monocytes Relative: 8 %
NEUTROS ABS: 5.7 10*3/uL (ref 1.7–7.7)
NEUTROS PCT: 80 %
Platelets: 168 10*3/uL (ref 150–400)
RBC: 3.78 MIL/uL — ABNORMAL LOW (ref 4.22–5.81)
RDW: 16.4 % — ABNORMAL HIGH (ref 11.5–15.5)
WBC: 7.1 10*3/uL (ref 4.0–10.5)

## 2014-12-27 LAB — RENAL FUNCTION PANEL
Albumin: 2.2 g/dL — ABNORMAL LOW (ref 3.5–5.0)
Anion gap: 8 (ref 5–15)
BUN: 45 mg/dL — AB (ref 6–20)
CHLORIDE: 114 mmol/L — AB (ref 101–111)
CO2: 29 mmol/L (ref 22–32)
Calcium: 9.2 mg/dL (ref 8.9–10.3)
Creatinine, Ser: 1.69 mg/dL — ABNORMAL HIGH (ref 0.61–1.24)
GFR calc Af Amer: 42 mL/min — ABNORMAL LOW (ref >60)
GFR, EST NON AFRICAN AMERICAN: 36 mL/min — AB (ref >60)
Glucose, Bld: 162 mg/dL — ABNORMAL HIGH (ref 65–99)
POTASSIUM: 3.5 mmol/L (ref 3.5–5.1)
Phosphorus: 3 mg/dL (ref 2.5–4.6)
Sodium: 151 mmol/L — ABNORMAL HIGH (ref 135–145)

## 2014-12-27 LAB — GLUCOSE, CAPILLARY
GLUCOSE-CAPILLARY: 138 mg/dL — AB (ref 65–99)
GLUCOSE-CAPILLARY: 144 mg/dL — AB (ref 65–99)
GLUCOSE-CAPILLARY: 146 mg/dL — AB (ref 65–99)
GLUCOSE-CAPILLARY: 151 mg/dL — AB (ref 65–99)
Glucose-Capillary: 117 mg/dL — ABNORMAL HIGH (ref 65–99)
Glucose-Capillary: 137 mg/dL — ABNORMAL HIGH (ref 65–99)

## 2014-12-27 LAB — MAGNESIUM
MAGNESIUM: 2.3 mg/dL (ref 1.7–2.4)
MAGNESIUM: 2.4 mg/dL (ref 1.7–2.4)

## 2014-12-27 LAB — TRIGLYCERIDES: TRIGLYCERIDES: 176 mg/dL — AB (ref <150)

## 2014-12-27 MED ORDER — CIPROFLOXACIN HCL 0.3 % OP SOLN
2.0000 [drp] | OPHTHALMIC | Status: DC
  Administered 2014-12-27 – 2014-12-30 (×26): 2 [drp] via OPHTHALMIC
  Filled 2014-12-27: qty 2.5

## 2014-12-27 MED ORDER — AMLODIPINE BESYLATE 5 MG PO TABS
5.0000 mg | ORAL_TABLET | Freq: Every day | ORAL | Status: DC
  Administered 2014-12-27 – 2015-01-02 (×7): 5 mg via ORAL
  Filled 2014-12-27 (×7): qty 1

## 2014-12-27 NOTE — Progress Notes (Addendum)
Patient noted 17 beat VTach on monitor. In to assess pt. Pt resting, opening eyes spontaneously and is still following commands. No notable changes. Heart rate 91. CCM MD notified. Orders for BMET and Mag level at this time.

## 2014-12-27 NOTE — Progress Notes (Signed)
PULMONARY  / [place]   Name: Darius Barber MRN: [id number] DOB: 1931/06/24    ADMISSION DATE:  12/21/2014 CONSULTATION DATE: 12/21/2014  REQUESTING CLINICIAN: Dr. Leonie Man PRIMARY SERVICE: Neurology  CHIEF COMPLAINT:  Stroke  BRIEF PATIENT DESCRIPTION: 81 M transferred from [facility] for VIR evaluation following presumed CVA. GCS 15 on arrival to NSICU but developed AMS with GCS of 7. PCCM consulted for intubation and vent management.  SIGNIFICANT EVENTS: 11/6 - Admitted to hospital  STUDIES:  Port CXR 11/7 - ETT 4.4cm above carina. No focal opacity. CT Head 11/7 - Left corona radiata infarct. No hemorrhage. Old infarcts.  TTE 11/7 - LV normal in size. Mild LVH. EF 55-60%. LA normal in size. RA normal in size. RV normal in size and function. No pericardial effusion. Port CXR 11/9 - ETT good position. Developing bilateral alveolar opacities. No pleural effusion. Port CXR 11/10 - ETT 3cm above carina. Patchy opacities persist.  LINES / TUBES: ETT 11/6  CULTURES: Tracheal Aspirate (11/6):  Oral Flora  ANTIBIOTICS: Unasyn 11/6>>  HISTORY OF PRESENT ILLNESS:  Darius Barber is an 81 M with prior CVA, CAD, carotid stenosis who presented to [facility] today for stroke-like symptoms. Patient is currently altered and unable to provide any history. He went to VIR on arrival with a normal angiogram. Shortly after arrival to NSICU patient became altered with GCS of 7. PCCM called for emergent intubation.   SUBJECTIVE: Secretions persist. No acute events overnight. Patient did have questionable emesis of liquid potassium earlier this morning. Continues to have thick, tan secretions.  REVIEW OF SYSTEMS:  Unable to obtain secondary to patient's intubation.  VITAL SIGNS: Temp:  [98.6 F (37 C)-99 F (37.2 C)] 98.9 F (37.2 C) (11/12 0757) Pulse Rate:  [47-81] 47 (11/12 1000) Resp:  [15-22] 22 (11/12 1000) BP: (112-188)/(62-99) 172/99 mmHg (11/12  1000) SpO2:  [92 %-96 %] 93 % (11/12 1000) FiO2 (%):  [40 %] 40 % (11/12 0800) Weight:  [92.5 kg (203 lb 14.8 oz)] 92.5 kg (203 lb 14.8 oz) (11/12 0500) HEMODYNAMICS:   VENTILATOR SETTINGS: Vent Mode:  [-] PRVC FiO2 (%):  [40 %] 40 % Set Rate:  [15 bmp] 15 bmp Vt Set:  [600 mL] 600 mL PEEP:  [10 cmH20] 10 cmH20 Plateau Pressure:  [23 cmH20-29 cmH20] 23 cmH20 INTAKE / OUTPUT: Intake/Output      11/11 0701 - 11/12 0700 11/12 0701 - 11/13 0700   I.V. (mL/kg) 633.6 (6.8) 104.6 (1.1)   NG/GT 1400 200   IV Piggyback 300 100   Total Intake(mL/kg) 2333.6 (25.2) 404.6 (4.4)   Urine (mL/kg/hr) 2085 (0.9) 390 (1)   Total Output 2085 390   Net +248.6 +14.6          PHYSICAL EXAMINATION: General:  Intubated. Wife at bedside. No distress.  Integument:  Warm & dry. No rash on exposed skin.  HEENT:  No scleral injection. Endotracheal tube in place.  Cardiovascular:  Regular rate & rhythm. Unable to appreciate JVD given body habitus.  Pulmonary:  Coarse breath sounds bilaterally persist. Thick, tan secretions unchanged. Symmetric chest wall rise on ventilator. Abdomen: Soft. Normal bowel sounds. Protuberant. Neurological: Patient in spontaneously moves left upper extremity. Babinski persistent right lower extremity. Not following commands but again on low-dose sedation.   LABS:  CBC  Recent Labs Lab 12/25/14 0247 12/26/14 0517 12/27/14 0313  WBC 9.8 7.2 7.1  HGB 11.6* 10.4* 10.5*  HCT 36.9* 33.6* 34.1*  PLT 137* 150 168  Coag's  Recent Labs Lab 12/21/14 0255  INR 1.18   BMET  Recent Labs Lab 12/26/14 0517 12/26/14 1454 12/27/14 0313  NA 151* 151* 151*  K 3.1* 3.6 3.5  CL 113* 115* 114*  CO2 29 28 29   BUN 49* 47* 45*  CREATININE 2.07* 1.79* 1.69*  GLUCOSE 163* 164* 162*   Electrolytes  Recent Labs Lab 12/23/14 0250  12/25/14 0247 12/26/14 0517 12/26/14 1454 12/27/14 0313  CALCIUM 8.5*  < > 8.7* 8.9 9.2 9.2  MG 1.5*  < > 2.0 2.2  --  2.3  PHOS 2.5  --    --  2.7  --  3.0  < > = values in this interval not displayed. Sepsis Markers  Recent Labs Lab 12/21/14 0255  LATICACIDVEN 0.9   ABG  Recent Labs Lab 12/21/14 1005  PHART 7.437  PCO2ART 36.9  PO2ART 106*   Liver Enzymes  Recent Labs Lab 12/21/14 0255 12/22/14 0316 12/23/14 0250 12/26/14 0517 12/27/14 0313  AST 17 23  --   --   --   ALT 13* 11*  --   --   --   ALKPHOS 60 51  --   --   --   BILITOT 0.5 0.9  --   --   --   ALBUMIN 3.0* 2.6* 2.6* 2.3* 2.2*   Cardiac Enzymes  Recent Labs Lab 12/23/14 2105 12/24/14 0314 12/24/14 1005  TROPONINI 1.84* 1.92* 0.90*   Glucose  Recent Labs Lab 12/26/14 1218 12/26/14 1526 12/26/14 2106 12/26/14 2343 12/27/14 0339 12/27/14 0757  GLUCAP 146* 141* 140* 136* 151* 117*   Imaging Dg Chest Port 1 View  12/27/2014  CLINICAL DATA:  Aspiration pneumonia EXAM: PORTABLE CHEST 1 VIEW COMPARISON:  12/26/2014 FINDINGS: Endotracheal tube terminates 2 cm above the carina. Multifocal patchy opacities, left upper lobe predominant, increased. No pleural effusion or pneumothorax. Cardiomegaly. Postsurgical changes related to prior CABG. Median sternotomy. Enteric tube courses into the stomach. IMPRESSION: Endotracheal tube terminates 2 cm above the carina. Multifocal patchy opacities, left upper lobe predominant, increased. Overall appearance suggests worsening multifocal pneumonia, possibly on the basis of aspiration given the clinical history. Electronically Signed   By: Julian Hy M.D.   On: 12/27/2014 09:02   Dg Chest Port 1 View  12/26/2014  CLINICAL DATA:  Evaluate course of aspiration pneumonia. intubated. NG tube reposition. EXAM: PORTABLE CHEST 1 VIEW COMPARISON:  12/25/2014 FINDINGS: NG tube has been advanced into the proximal stomach. Endotracheal soon is 5 cm above the carina. Mild cardiomegaly. Vascular congestion. Patchy bilateral airspace opacities are slightly improved since prior study. No visible effusions.  IMPRESSION: Endotracheal tube in stable position. A NG tube advanced into the proximal stomach. Slight improvement in patchy bilateral airspace disease and lung volumes. Electronically Signed   By: Rolm Baptise M.D.   On: 12/26/2014 11:59    Labwork from [place].  Na 141 K 3.9 Cl 102 Co2 30.1 BUN 25 Cr 1.83 Glu 136 Ca 8.7  WBC 8.3 HgB 13.4 Hct 43.3 PLT 141  ASSESSMENT / PLAN: 81 year old male with left corona radiata infarct and altered mentation requiring intubation for airway protection. Slowly improving with regards to FiO2 and oxygen requirement. Patient does have a free water deficit likely contributing to his hypernatremia but given his tenuous respiratory status I'm not initiating aggressive free water replacement. Continuing airway clearance maneuvers at this time. Patient continuing on treatment for aspiration pneumonia. Patient's wife updated at bedside this morning. Status remains critical with a  guarded prognosis.   1. Left corona radiata CVA: Management per neurology. Aspirin, Plavix, & Lipitor. 2. Acute hypoxic respiratory failure: Likely secondary  to aspiration pneumonia. Continuing to wean FiO2 to maintain saturation greater than 94%. Plan for SBT once patient's respiratory status improves. Albuterol nebs q6hr, guaifenesin via tube every 6 hours, & chest PT via bed every 4 hours  to help with secretion clearance. Avoiding further diuresis given elevation in creatinine & hypernatremia. Decrease PEEP to 5 and begin PS trials. 3. Altered mental status:  Stable. Likely secondary to CVA as well as sedation requirement while on ventilator.  4. Aspiration pneumonia: Continuing on Unasyn day #5/10. Tracheal aspirate culture showed only oral floor consistent with aspiration. Continuing mucolytic & airway clearance. 5. H/O CAD S/P CABG/ICM:  Appreciate Cardiology recommendations. Likely stress/demand ischemia. Echocardiogram without mention of wall motion  abnormality. On Plavix, Lipitor, & ASA.  6. Acute renal failure:  Stable with good UOP. Continuing to trend renal function with daily BUN/creatinine. Also monitoring urine output with Foley catheter. 7. Hypernatremia: Initiating gentle free water replacement with 100 mL via tube every 8 hours.  8. Hypokalemia: Replaced with 60 mEq potassium chloride via tube. Repeat electrolytes at 1500 today. 9. Anemia: Mild. No signs of active bleeding. Continuing to trend hemoglobin daily with CBC. 10. Thrombocytopenia: Resolved. Continue to trend hemoglobin daily with CBC. 11. Hyperglycemia: Good blood glucose control. Hemoglobin A1c 6.2. Accu-Cheks every 4 hours with low-dose sliding scale coverage. 12. Hypomagnesemia:  Resolved. Monitoring magnesium daily with labs. 13. Hypertension: Plan to initiate Cardene drip for systolic blood pressure greater than 180 mmHg and add PRN hydralazine.  14. History of Hyperlipidemia: Lipitor 80mg  via tube qhs. 15. History of Hypothyroidism:  Continuing levothyroxine 150 g via tube daily. 16. Diet: Continuing tube feeds per dietary recommendations.  17. Prophylaxis: SCDs, Protonix IV daily, & heparin subcutaneous every 8 hours given his acute renal failure.   The patient is critically ill with multiple organ systems failure and requires high complexity decision making for assessment and support, frequent evaluation and titration of therapies, application of advanced monitoring technologies and extensive interpretation of multiple databases.   Critical Care Time devoted to patient care services described in this note is  35  Minutes. This time reflects time of care of this signee Dr Jennet Maduro. This critical care time does not reflect procedure time, or teaching time or supervisory time of PA/NP/Med student/Med Resident etc but could involve care discussion time.  Rush Farmer, M.D. [facility] Pulmonary/Critical Care Medicine. Pager: [phone]. After hours pager:  [phone].  12/27/2014, 11:20 AM

## 2014-12-27 NOTE — Progress Notes (Signed)
eLink Physician-Brief Progress Note Patient Name: Darius Barber DOB: 1931-09-14 MRN: [id number]   Date of Service  12/27/2014  HPI/Events of Note  Had run of VT.  eICU Interventions  Will check electrolytes.     Intervention Category Major Interventions: Other:  Asti Mackley 12/27/2014, 9:43 PM

## 2014-12-27 NOTE — Progress Notes (Addendum)
STROKE TEAM PROGRESS NOTE   HISTORY Darius Barber is an 81 y.o. male transfered from [facility] after presenting with acute onset of right sided weakness and aphasia. Evaluated by specialists on call in ED (at [facility]) and given tPA for NIHSS of 20. Transferred to [facility] after tele-neurologist suggested IR evaluation. Per ED notes, patient was last seen normal at 9:00 PM on 12/20/14, ~45 minutes after wife noted abnormal breathing and reported "flexing movements of left arm".  Afterwards, patient was non-verbal, not moving his right side. ED notes mentioned a left gaze preference. CT head imaging reviewed, shows no acute process, signs of an old right MCA infarct. ED physician notes patient has history of fully blocked right ICA and 90% stenosis of L ICA.   Cerebral angiogram performed by Dr. Estanislado Pandy on 12/21/2014, no intervention, no acute findings.   Date last known well: 11/05 Time last known well: 2100 tPA Given: yes Modified Rankin: Rankin Score=0   SUBJECTIVE (INTERVAL HISTORY) Patient continues to be more alert this AM.  He follows commands.  No significant change in neuro status.     OBJECTIVE Temp:  [98.6 F (37 C)-99 F (37.2 C)] 98.7 F (37.1 C) (11/12 0351) Pulse Rate:  [58-84] 58 (11/12 0600) Cardiac Rhythm:  [-] Normal sinus rhythm (11/11 2000) Resp:  [15-22] 15 (11/12 0600) BP: (101-188)/(62-99) 135/70 mmHg (11/12 0600) SpO2:  [91 %-96 %] 95 % (11/12 0600) FiO2 (%):  [40 %] 40 % (11/12 0600) Weight:  [92.5 kg (203 lb 14.8 oz)] 92.5 kg (203 lb 14.8 oz) (11/12 0500)  CBC:   Recent Labs Lab 12/26/14 0517 12/27/14 0313  WBC 7.2 7.1  NEUTROABS 5.8 5.7  HGB 10.4* 10.5*  HCT 33.6* 34.1*  MCV 88.9 90.2  PLT 150 [id number]    Basic Metabolic Panel:   Recent Labs Lab 12/26/14 0517 12/26/14 1454 12/27/14 0313  NA 151* 151* 151*  K 3.1* 3.6 3.5  CL 113* 115* 114*  CO2 29 28 29   GLUCOSE 163* 164* 162*  BUN 49* 47* 45*  CREATININE 2.07* 1.79* 1.69*  CALCIUM  8.9 9.2 9.2  MG 2.2  --  2.3  PHOS 2.7  --  3.0    Lipid Panel:     Component Value Date/Time   CHOL 129 12/21/2014 0410   TRIG 176* 12/27/2014 0313   HDL 27* 12/21/2014 0410   CHOLHDL 4.8 12/21/2014 0410   VLDL 23 12/21/2014 0410   LDLCALC 79 12/21/2014 0410   Urine Drug Screen:     Component Value Date/Time   LABOPIA NONE DETECTED 12/21/2014 0500   COCAINSCRNUR NONE DETECTED 12/21/2014 0500   LABBENZ POSITIVE* 12/21/2014 0500   AMPHETMU NONE DETECTED 12/21/2014 0500   THCU NONE DETECTED 12/21/2014 0500   LABBARB NONE DETECTED 12/21/2014 0500      IMAGING  Ct Abdomen Wo Contrast 12/21/2014   No evidence of significant hemorrhage within the chest, abdomen or pelvis. Confluent airspace consolidation in the posterior lower lobes. This could represent infectious infiltrate. Interstitial and alveolar edema is probably pulse present bilaterally.   Ct Head Wo Contrast 12/21/2014   No acute intracranial process.  Old RIGHT Chronic changes including old RIGHT MCA territory infarct and old LEFT basal ganglia lacunar infarcts. Findings of normal pressure hydrocephalus are similar.   Ct Chest Wo Contrast 12/21/2014   No evidence of significant hemorrhage within the chest, abdomen or pelvis. Confluent airspace consolidation in the posterior lower lobes. This could represent infectious infiltrate. Interstitial and alveolar edema  is probably pulse present bilaterally.   Dg Chest Port 1 View 12/21/2014   Satisfactory ET tube position. No other significant interval change.  CXR 1 view 12/24/14 Worsening bilateral airspace opacification may be due to edema. Pneumonia is not excluded.   MRI Brain 12/21/14 1. Left corona radiata 2.5 cm infarct without hemorrhage or mass effect. 2. No other acute intracranial abnormality. Chronic ischemic disease. Chronic right ICA occlusion. 3. Vertex scalp hematoma.  PHYSICAL EXAM  General: NAD. On ventilator. Follows most commands on the left.    HEENT: PERRL, EOMI. Moist mucus membranes. OETT/OGT in place. Sclera injected Neck: Supple, no lymphadenopathy or carotid bruits Lungs: Air entry equal bilaterally, scattered coarse rhonchi; right worse than left. No wheezes.  Heart: RRR, no murmurs, gallops, or rubs Abdomen: Soft, mildly distended, BS + Extremities: No cyanosis, clulops, or rubs Abdomen: Soft, mildly distendbbing, or edema  Neurological Exam  Mental Status - Following most commands on the left  Cranial Nerves II - XII - II - Unable to assess III, IV, VI - Extraocular movements intact with some right-sided neglect. Blinks/closes eyes to command V/VII-  Corneals intact but decreased on the right  VIII - Hearing impaired X - Gag intact. XI - Unable to assess. XII - Unable to assess.  Motor Strength - Strength intact in LUE and LLE, RLE tone intact. RUE 0/5 strength, RLE 2/5 strength. Motor Tone - Muscle tone was assessed at the neck and appendages and was normal.   Reflexes - The patient's reflexes were 1+ in all extremities and he had no pathological reflexes.  Sensory - Unable to assess  Coordination - Unable to assess  Gait and Station - Unable to assess   ASSESSMENT/PLAN Mr. Darius Barber is a 81 y.o. male with history of previous CVA, HLD, h/o Colon CA, HTN, CAD, right carotid artery occlusion, vascular disease, history of a left carotid stent, and renal insufficiency, presenting with aphasia and right-sided weakness. He received IV t-PA at [facility] for NIHSS of 20.  Stroke:  Dominant Left Corona Radiata ischemic infarct likely 2/2 small vessel disease  Resultant  Left hemiplegia, aphasia  MRI: Left corona radiata 2.5 cm infarct without hemorrhage or mass effect.  Catheter Angiogram: Angiographically occluded right internal carotid artery and the left  external carotid artery. Mild intrastent stenosis of the left internal carotid artery.  Carotid Doppler (10/24/2014): Known right internal  carotid artery occlusion, patent left internal  carotid artery with stent 1-39%. Will obtain report from catheter angiogram.  2D Echo: EF 55-60%. PAP 65 mm Hg  EEG: Moderate diffuse slowing   LDL - 79  HgbA1c 6.2  VTE prophylaxis - SCDs, Lovenox 40 mg SQ qd Diet NPO time specified  aspirin 81 mg daily and clopidogrel 75 mg daily prior to admission, both restarted.   Ongoing aggressive stroke risk factor management  Therapy recommendations:  SNF  Disposition: Pending  Infectious Disease  Aspiration Pneumonia:  Management per PCCM.    Continue Unasyn  On chest therapy  CXR in the AM; yesterday's with slight improvement.  Possible conjunctivitis  Added antibiotic drops Hypernatremia  Most likely related to diuresis. Free water deficit ~4L.   Continue free water per OGT  Acute on CKD  Baseline Cr closer to 1.4, 1.69n down from 1.79 today   Continue to monitor  Hypothyroidism  Synthroid IV  Hypertension  Stable  Cardene prn gtt for SBP >180       Today, added Norvasc 5mg  daily  GI  Stable  Tolerating tube feeds  Last BM on 12/25/2014  Hyperlipidemia  Home meds:  Lipitor 80 mg daily not resumed in hospital. NPO  LDL 79, goal < 70  Add Lipitor 80 mg daily when taking PO  Continue statin at discharge  Other Stroke Risk Factors  Advanced age  Cigarette smoker, quit smoking 35 years ago   Hx stroke/TIA  Coronary artery disease   No significant change in neuro exam. Hopeful for extubation soon. No family at bedside   Personally examined patient and images, and have participated in and made any corrections needed to history, physical, neuro exam,assessment and plan as stated above.  I have personally obtained the history, evaluated lab date, reviewed imaging studies and agree with radiology interpretations.  Neurologic exam is stable, continue to follow simple central and peripheral commands on the left, but slow in response. Right  hemiplegia. Still not able to wean off vent due to likely aspiration pneumonia. Cont antibiotics. Monitor renal function, free water per above. Appreciate management by pccm.  This patient is critically ill due to respiratory failure, cerebral infarction, right ICA occlusion and at significant risk of neurological worsening, death form recurrent infarction, and cardiac combinations. This patient's care requires constant monitoring of vital signs, hemodynamics, respiratory and cardiac monitoring, review of multiple databases, neurological assessment, discussion with family, other specialists and medical decision making of high complexity. I spent 35 minutes of neurocritical care time in the care of this patient.  Elissa Hefty, MD Stroke Neurology [facility]     To contact Stroke Continuity provider, please refer to [email or web address] After hours, contact General Neurology

## 2014-12-27 NOTE — Progress Notes (Signed)
Pharmacy Antibiotic Time-Out Note  Darius Barber is a 81 y.o. year-old male admitted on 12/21/2014.  The patient is currently on day 7 of Unasyn for empiric coverage of aspiration pneumonia.  Assessment/Plan: This patient's current antibiotics will be continued without adjustments. Per CCM note, patient is to continue on Unasyn for a total of 10 days.  Will add stop date for 11/16. Will likely remain on the same dose until discontinuation so Rx will sign off.    Recent Labs Lab 12/23/14 0250 12/24/14 0314 12/25/14 0247 12/26/14 0517 12/27/14 0313  WBC 13.1* 9.3 9.8 7.2 7.1     Recent Labs Lab 12/24/14 0314 12/25/14 0247 12/26/14 0517 12/26/14 1454 12/27/14 0313  CREATININE 1.83* 2.01* 2.07* 1.79* 1.69*   Estimated Creatinine Clearance: 38.5 mL/min (by C-G formula based on Cr of 1.69). Tmax/24h 100.2  Antimicrobial allergies: none  Antimicrobials this admission: Unasyn 11/6 >> (11/16)  Levels/dose changes this admission: None  Microbiology Results: 11/6 Trach culture: normal flora 11/6 MRSA PCR: negative

## 2014-12-28 ENCOUNTER — Inpatient Hospital Stay (HOSPITAL_COMMUNITY): Payer: Medicare HMO

## 2014-12-28 DIAGNOSIS — I63231 Cerebral infarction due to unspecified occlusion or stenosis of right carotid arteries: Secondary | ICD-10-CM

## 2014-12-28 LAB — BASIC METABOLIC PANEL
Anion gap: 10 (ref 5–15)
BUN: 48 mg/dL — AB (ref 6–20)
CALCIUM: 9.4 mg/dL (ref 8.9–10.3)
CO2: 28 mmol/L (ref 22–32)
CREATININE: 1.79 mg/dL — AB (ref 0.61–1.24)
Chloride: 115 mmol/L — ABNORMAL HIGH (ref 101–111)
GFR calc Af Amer: 39 mL/min — ABNORMAL LOW (ref >60)
GFR, EST NON AFRICAN AMERICAN: 34 mL/min — AB (ref >60)
GLUCOSE: 158 mg/dL — AB (ref 65–99)
Potassium: 3.4 mmol/L — ABNORMAL LOW (ref 3.5–5.1)
Sodium: 153 mmol/L — ABNORMAL HIGH (ref 135–145)

## 2014-12-28 LAB — BLOOD GAS, ARTERIAL
Acid-Base Excess: 3.4 mmol/L — ABNORMAL HIGH (ref 0.0–2.0)
Bicarbonate: 27.3 mEq/L — ABNORMAL HIGH (ref 20.0–24.0)
DRAWN BY: 44135
FIO2: 50
O2 SAT: 93.5 %
PATIENT TEMPERATURE: 98.6
PCO2 ART: 40.6 mmHg (ref 35.0–45.0)
PEEP: 5 cmH2O
PH ART: 7.442 (ref 7.350–7.450)
PO2 ART: 74.1 mmHg — AB (ref 80.0–100.0)
RATE: 15 resp/min
TCO2: 28.5 mmol/L (ref 0–100)
VT: 600 mL

## 2014-12-28 LAB — CBC WITH DIFFERENTIAL/PLATELET
Basophils Absolute: 0 10*3/uL (ref 0.0–0.1)
Basophils Relative: 0 %
EOS ABS: 0.1 10*3/uL (ref 0.0–0.7)
EOS PCT: 1 %
HCT: 37.9 % — ABNORMAL LOW (ref 39.0–52.0)
Hemoglobin: 11.5 g/dL — ABNORMAL LOW (ref 13.0–17.0)
LYMPHS ABS: 1 10*3/uL (ref 0.7–4.0)
LYMPHS PCT: 10 %
MCH: 27.6 pg (ref 26.0–34.0)
MCHC: 30.3 g/dL (ref 30.0–36.0)
MCV: 91.1 fL (ref 78.0–100.0)
MONO ABS: 0.7 10*3/uL (ref 0.1–1.0)
Monocytes Relative: 8 %
Neutro Abs: 7.9 10*3/uL — ABNORMAL HIGH (ref 1.7–7.7)
Neutrophils Relative %: 81 %
PLATELETS: 210 10*3/uL (ref 150–400)
RBC: 4.16 MIL/uL — ABNORMAL LOW (ref 4.22–5.81)
RDW: 16.6 % — AB (ref 11.5–15.5)
WBC: 9.7 10*3/uL (ref 4.0–10.5)

## 2014-12-28 LAB — GLUCOSE, CAPILLARY
GLUCOSE-CAPILLARY: 141 mg/dL — AB (ref 65–99)
GLUCOSE-CAPILLARY: 135 mg/dL — AB (ref 65–99)
Glucose-Capillary: 145 mg/dL — ABNORMAL HIGH (ref 65–99)
Glucose-Capillary: 130 mg/dL — ABNORMAL HIGH (ref 65–99)
Glucose-Capillary: 153 mg/dL — ABNORMAL HIGH (ref 65–99)
Glucose-Capillary: 159 mg/dL — ABNORMAL HIGH (ref 65–99)

## 2014-12-28 LAB — PHOSPHORUS: Phosphorus: 3.4 mg/dL (ref 2.5–4.6)

## 2014-12-28 LAB — TRIGLYCERIDES: TRIGLYCERIDES: 162 mg/dL — AB (ref <150)

## 2014-12-28 LAB — ALBUMIN: ALBUMIN: 2.4 g/dL — AB (ref 3.5–5.0)

## 2014-12-28 LAB — MAGNESIUM: MAGNESIUM: 2.5 mg/dL — AB (ref 1.7–2.4)

## 2014-12-28 MED ORDER — OXYBUTYNIN CHLORIDE 5 MG PO TABS
5.0000 mg | ORAL_TABLET | Freq: Two times a day (BID) | ORAL | Status: DC
  Filled 2014-12-28 (×2): qty 1

## 2014-12-28 MED ORDER — BETHANECHOL CHLORIDE 5 MG PO TABS
5.0000 mg | ORAL_TABLET | Freq: Three times a day (TID) | ORAL | Status: DC
  Administered 2014-12-28 – 2015-01-15 (×52): 5 mg
  Filled 2014-12-28 (×58): qty 1

## 2014-12-28 MED ORDER — BISACODYL 10 MG RE SUPP
10.0000 mg | Freq: Every day | RECTAL | Status: DC | PRN
  Administered 2014-12-28: 10 mg via RECTAL
  Filled 2014-12-28: qty 1

## 2014-12-28 MED ORDER — FREE WATER
250.0000 mL | Freq: Four times a day (QID) | Status: DC
  Administered 2014-12-28 – 2015-01-05 (×26): 250 mL

## 2014-12-28 MED ORDER — GLYCERIN (LAXATIVE) 2.1 G RE SUPP
1.0000 | Freq: Every day | RECTAL | Status: DC | PRN
  Filled 2014-12-28: qty 1

## 2014-12-28 NOTE — Progress Notes (Signed)
STROKE TEAM PROGRESS NOTE   HISTORY Darius Barber is an 81 y.o. male transfered from [facility] after presenting with acute onset of right sided weakness and aphasia. Evaluated by specialists on call in ED (at [facility]) and given tPA for NIHSS of 20. Transferred to [facility] after tele-neurologist suggested IR evaluation. Per ED notes, patient was last seen normal at 9:00 PM on 12/20/14, ~45 minutes after wife noted abnormal breathing and reported "flexing movements of left arm".  Afterwards, patient was non-verbal, not moving his right side. ED notes mentioned a left gaze preference. CT head imaging reviewed, shows no acute process, signs of an old right MCA infarct. ED physician notes patient has history of fully blocked right ICA and 90% stenosis of L ICA.   Cerebral angiogram performed by Dr. Estanislado Pandy on 12/21/2014, no intervention, no acute findings.   Date last known well: 11/05 Time last known well: 2100 tPA Given: yes Modified Rankin: Rankin Score=0   SUBJECTIVE (INTERVAL HISTORY) Patient continues to be intermittently alert.  He follows simple commands.  No significant change in neuro status.  Patient had a 17 beat of VT overnight.  CCM team aware and managing.  Per CCM notes, patient's PEEP may be decreased and PS trials started if medically appropriate.    OBJECTIVE Temp:  [97.4 F (36.3 C)-99.6 F (37.6 C)] 99.6 F (37.6 C) (11/13 0747) Pulse Rate:  [47-106] 74 (11/13 0719) Cardiac Rhythm:  [-]  Resp:  [15-33] 29 (11/13 0719) BP: (92-182)/(53-99) 143/74 mmHg (11/13 0600) SpO2:  [89 %-95 %] 90 % (11/13 0719) FiO2 (%):  [40 %-50 %] 50 % (11/13 0719) Weight:  [91.853 kg (202 lb 8 oz)] 91.853 kg (202 lb 8 oz) (11/13 0500)  CBC:   Recent Labs Lab 12/27/14 0313 12/28/14 0230  WBC 7.1 9.7  NEUTROABS 5.7 7.9*  HGB 10.5* 11.5*  HCT 34.1* 37.9*  MCV 90.2 91.1  PLT 168 [id number]    Basic Metabolic Panel:   Recent Labs Lab 12/27/14 0313 12/27/14 2306 12/28/14 0230  NA  151* 153* 153*  K 3.5 3.5 3.4*  CL 114* 118* 115*  CO2 29 28 28   GLUCOSE 162* 175* 158*  BUN 45* 46* 48*  CREATININE 1.69* 1.76* 1.79*  CALCIUM 9.2 9.3 9.4  MG 2.3 2.4 2.5*  PHOS 3.0  --  3.4    Lipid Panel:     Component Value Date/Time   CHOL 129 12/21/2014 0410   TRIG 162* 12/28/2014 0230   HDL 27* 12/21/2014 0410   CHOLHDL 4.8 12/21/2014 0410   VLDL 23 12/21/2014 0410   LDLCALC 79 12/21/2014 0410   Urine Drug Screen:     Component Value Date/Time   LABOPIA NONE DETECTED 12/21/2014 0500   COCAINSCRNUR NONE DETECTED 12/21/2014 0500   LABBENZ POSITIVE* 12/21/2014 0500   AMPHETMU NONE DETECTED 12/21/2014 0500   THCU NONE DETECTED 12/21/2014 0500   LABBARB NONE DETECTED 12/21/2014 0500      IMAGING  Ct Abdomen Wo Contrast 12/21/2014   No evidence of significant hemorrhage within the chest, abdomen or pelvis. Confluent airspace consolidation in the posterior lower lobes. This could represent infectious infiltrate. Interstitial and alveolar edema is probably pulse present bilaterally.   Ct Head Wo Contrast 12/21/2014   No acute intracranial process.  Old RIGHT Chronic changes including old RIGHT MCA territory infarct and old LEFT basal ganglia lacunar infarcts. Findings of normal pressure hydrocephalus are similar.   Ct Chest Wo Contrast 12/21/2014   No evidence of significant  hemorrhage within the chest, abdomen or pelvis. Confluent airspace consolidation in the posterior lower lobes. This could represent infectious infiltrate. Interstitial and alveolar edema is probably pulse present bilaterally.   Dg Chest Port 1 View 12/21/2014   Satisfactory ET tube position. No other significant interval change.  CXR 1 view 12/24/14 Worsening bilateral airspace opacification may be due to edema. Pneumonia is not excluded.   MRI Brain 12/21/14 1. Left corona radiata 2.5 cm infarct without hemorrhage or mass effect. 2. No other acute intracranial abnormality. Chronic ischemic  disease. Chronic right ICA occlusion. 3. Vertex scalp hematoma.  PHYSICAL EXAM  General: NAD. On ventilator. Follows most commands on the left.   HEENT: PERRL, EOMI. Moist mucus membranes. OETT/OGT in place. Sclera injected Neck: Supple, no lymphadenopathy or carotid bruits Lungs: Air entry equal bilaterally, scattered coarse rhonchi; right worse than left. No wheezes.  Heart: RRR, no murmurs, gallops, or rubs Abdomen: Soft, mildly distended, BS + Extremities: No cyanosis, clulops, or rubs Abdomen: Soft, mildly distendbbing, or edema  Neurological Exam  Mental Status - Following most commands on the left.  Apparently, overnight, patient squeezed his hand once or twice for yes or no.  I was not able to use this due to lethargy this morning.  Cranial Nerves II - XII - II - PERRLA, blinks to threat III, IV, VI - Extraocular movements intact with some right-sided neglect. Blinks/closes eyes to command V/VII-  Corneals intact but decreased on the right  VIII - Hearing impaired X - Gag intact. XI - Unable to assess. XII - Unable to assess.  Motor Strength - Strength intact in LUE and LLE, RLE tone intact. RUE 0/5 strength, RLE 2/5 strength. Motor Tone - Muscle tone was assessed at the neck and appendages and was normal.   Sensory - Withdraws weakly to noxious stimuli  Coordination - Unable to assess  Gait and Station - Unable to assess   ASSESSMENT/PLAN Mr. Darius Barber is a 81 y.o. male with history of previous CVA, HLD, h/o Colon CA, HTN, CAD, right carotid artery occlusion, vascular disease, history of a left carotid stent, and renal insufficiency, presenting with aphasia and right-sided weakness. He received IV t-PA at [facility] for NIHSS of 20.    Stroke:  Dominant Left Corona Radiata ischemic infarct likely 2/2 small vessel disease  Resultant  Left hemiplegia, aphasia  MRI: Left corona radiata 2.5 cm infarct without hemorrhage or mass effect.  Catheter  Angiogram: Angiographically occluded right internal carotid artery and the left  external carotid artery. Mild intrastent stenosis of the left internal carotid artery.  Carotid Doppler (10/24/2014): Known right internal carotid artery occlusion, patent left internal  carotid artery with stent 1-39%. Will obtain report from catheter angiogram.  2D Echo: EF 55-60%. PAP 65 mm Hg  EEG: Moderate diffuse slowing   LDL - 79  HgbA1c 6.2  VTE prophylaxis - SCDs, Lovenox 40 mg SQ qd Diet NPO time specified  aspirin 81 mg daily and clopidogrel 75 mg daily prior to admission, both restarted.   Ongoing aggressive stroke risk factor management  Therapy recommendations:  SNF  Disposition: Pending  Infectious Disease  Aspiration Pneumonia:  Management per PCCM.    Continue Unasyn  On chest therapy  CXR pending this AM;   Possible conjunctivitis  Continue antibiotic drops Hypernatremia  Most likely related to diuresis. Free water deficit may need to be judiciously addressed with increasing free water  Acute on CKD  Baseline Cr closer to 1.4, today 1.79  Continue to monitor  Hypothyroidism  Synthroid IV  Hypertension  Stable  Cardene prn gtt for SBP >180       Today, added Norvasc 5mg  daily  GI  Stable  Tolerating tube feeds  Hyperlipidemia  Home meds:  Lipitor 80 mg daily not resumed in hospital. NPO  LDL 79, goal < 70  Add Lipitor 80 mg daily when taking PO  Continue statin at discharge  Other Stroke Risk Factors  Advanced age  Cigarette smoker, quit smoking 35 years ago   Hx stroke/TIA  Coronary artery disease   No significant change in neuro exam. Hopeful for extubation soon. If unsuccessful, family will need to review the option of trach.  CCM consult for direction appreciated.  No family at bedside   Personally examined patient and images, and have participated in and made any corrections needed to history, physical, neuro exam,assessment and  plan as stated above.  I have personally obtained the history, evaluated lab date, reviewed imaging studies and agree with radiology interpretations.  Neurologic exam is stable, continue to follow simple central and peripheral commands on the left, but slow in response. Right hemiplegia. Still not able to wean off vent due to likely aspiration pneumonia. Cont antibiotics. Monitor renal function, free water per above. Appreciate management by pccm.  This patient is critically ill due to respiratory failure, cerebral infarction, right ICA occlusion and at significant risk of neurological worsening, death form recurrent infarction, and cardiac combinations. This patient's care requires constant monitoring of vital signs, hemodynamics, respiratory and cardiac monitoring, review of multiple databases, neurological assessment, discussion with family, other specialists and medical decision making of high complexity. I spent 35 minutes of neurocritical care time in the care of this patient.  Elissa Hefty, MD Stroke Neurology [facility]     To contact Stroke Continuity provider, please refer to [email or web address] After hours, contact General Neurology

## 2014-12-28 NOTE — Progress Notes (Signed)
PULMONARY  / [place]   Name: Darius Barber MRN: [id number] DOB: 06-Feb-1932    ADMISSION DATE:  12/21/2014 CONSULTATION DATE: 12/21/2014  REQUESTING CLINICIAN: Dr. Leonie Man PRIMARY SERVICE: Neurology  CHIEF COMPLAINT:  Stroke  BRIEF PATIENT DESCRIPTION: 81 M transferred from [facility] for VIR evaluation following presumed CVA. GCS 15 on arrival to NSICU but developed AMS with GCS of 7. PCCM consulted for intubation and vent management.  SIGNIFICANT EVENTS: 11/6 - Admitted to hospital  STUDIES:  Port CXR 11/7 - ETT 4.4cm above carina. No focal opacity. CT Head 11/7 - Left corona radiata infarct. No hemorrhage. Old infarcts.  TTE 11/7 - LV normal in size. Mild LVH. EF 55-60%. LA normal in size. RA normal in size. RV normal in size and function. No pericardial effusion. Port CXR 11/9 - ETT good position. Developing bilateral alveolar opacities. No pleural effusion. Port CXR 11/10 - ETT 3cm above carina. Patchy opacities persist.  LINES / TUBES: ETT 11/6  CULTURES: Tracheal Aspirate (11/6):  Oral Flora  ANTIBIOTICS: Unasyn 11/6>>  HISTORY OF PRESENT ILLNESS:  Darius Barber is an 81 M with prior CVA, CAD, carotid stenosis who presented to [facility] today for stroke-like symptoms. Patient is currently altered and unable to provide any history. He went to VIR on arrival with a normal angiogram. Shortly after arrival to NSICU patient became altered with GCS of 7. PCCM called for emergent intubation.   SUBJECTIVE: No events overnight, much more alert and interactive.  VITAL SIGNS: Temp:  [97.4 F (36.3 C)-99.6 F (37.6 C)] 99.6 F (37.6 C) (11/13 0747) Pulse Rate:  [47-106] 74 (11/13 0719) Resp:  [15-33] 29 (11/13 0719) BP: (92-182)/(53-99) 143/74 mmHg (11/13 0600) SpO2:  [89 %-95 %] 90 % (11/13 0719) FiO2 (%):  [40 %-50 %] 50 % (11/13 0719) Weight:  [91.853 kg (202 lb 8 oz)] 91.853 kg (202 lb 8 oz) (11/13 0500) HEMODYNAMICS:   VENTILATOR  SETTINGS: Vent Mode:  [-] PRVC FiO2 (%):  [40 %-50 %] 50 % Set Rate:  [15 bmp] 15 bmp Vt Set:  [600 mL] 600 mL PEEP:  [5 cmH20] 5 cmH20 Pressure Support:  [12 cmH20] 12 cmH20 Plateau Pressure:  [27 cmH20-33 cmH20] 28 cmH20   INTAKE / OUTPUT: Intake/Output      11/12 0701 - 11/13 0700 11/13 0701 - 11/14 0700   I.V. (mL/kg) 504.6 (5.5)    NG/GT 1400    IV Piggyback 300    Total Intake(mL/kg) 2204.6 (24)    Urine (mL/kg/hr) 1870 (0.8)    Total Output 1870     Net +334.6           PHYSICAL EXAMINATION: General:  Intubated. No distress.  Integument:  Warm & dry. No rash on exposed skin.  HEENT:  No scleral injection. Endotracheal tube in place.  Cardiovascular:  Regular rate & rhythm. Unable to appreciate JVD given body habitus.  Pulmonary:  Coarse breath sounds bilaterally persist. Thick, tan secretions unchanged. Symmetric chest wall rise on ventilator. Abdomen: Soft. Normal bowel sounds. Protuberant. Neurological: Patient in spontaneously moves left upper extremity. Babinski persistent right lower extremity. Not following commands but again on low-dose sedation.   LABS:  CBC  Recent Labs Lab 12/26/14 0517 12/27/14 0313 12/28/14 0230  WBC 7.2 7.1 9.7  HGB 10.4* 10.5* 11.5*  HCT 33.6* 34.1* 37.9*  PLT 150 168 210   Coag's No results for input(s): APTT, INR in the last 168 hours. BMET  Recent Labs Lab 12/27/14 [id number]  12/27/14 2306 12/28/14 0230  NA 151* 153* 153*  K 3.5 3.5 3.4*  CL 114* 118* 115*  CO2 29 28 28   BUN 45* 46* 48*  CREATININE 1.69* 1.76* 1.79*  GLUCOSE 162* 175* 158*   Electrolytes  Recent Labs Lab 12/26/14 0517  12/27/14 0313 12/27/14 2306 12/28/14 0230  CALCIUM 8.9  < > 9.2 9.3 9.4  MG 2.2  --  2.3 2.4 2.5*  PHOS 2.7  --  3.0  --  3.4  < > = values in this interval not displayed. Sepsis Markers No results for input(s): LATICACIDVEN, PROCALCITON, O2SATVEN in the last 168 hours. ABG  Recent Labs Lab 12/21/14 1005 12/28/14 0402   PHART 7.437 7.442  PCO2ART 36.9 40.6  PO2ART 106* 74.1*   Liver Enzymes  Recent Labs Lab 12/22/14 0316  12/26/14 0517 12/27/14 0313 12/28/14 0230  AST 23  --   --   --   --   ALT 11*  --   --   --   --   ALKPHOS 51  --   --   --   --   BILITOT 0.9  --   --   --   --   ALBUMIN 2.6*  < > 2.3* 2.2* 2.4*  < > = values in this interval not displayed. Cardiac Enzymes  Recent Labs Lab 12/23/14 2105 12/24/14 0314 12/24/14 1005  TROPONINI 1.84* 1.92* 0.90*   Glucose  Recent Labs Lab 12/27/14 0757 12/27/14 1213 12/27/14 1547 12/27/14 1949 12/27/14 2331 12/28/14 0324  GLUCAP 117* 146* 137* 144* 138* 145*   Imaging Dg Chest Port 1 View  12/27/2014  CLINICAL DATA:  Aspiration pneumonia EXAM: PORTABLE CHEST 1 VIEW COMPARISON:  12/26/2014 FINDINGS: Endotracheal tube terminates 2 cm above the carina. Multifocal patchy opacities, left upper lobe predominant, increased. No pleural effusion or pneumothorax. Cardiomegaly. Postsurgical changes related to prior CABG. Median sternotomy. Enteric tube courses into the stomach. IMPRESSION: Endotracheal tube terminates 2 cm above the carina. Multifocal patchy opacities, left upper lobe predominant, increased. Overall appearance suggests worsening multifocal pneumonia, possibly on the basis of aspiration given the clinical history. Electronically Signed   By: Julian Hy M.D.   On: 12/27/2014 09:02   Dg Chest Port 1 View  12/26/2014  CLINICAL DATA:  Evaluate course of aspiration pneumonia. intubated. NG tube reposition. EXAM: PORTABLE CHEST 1 VIEW COMPARISON:  12/25/2014 FINDINGS: NG tube has been advanced into the proximal stomach. Endotracheal soon is 5 cm above the carina. Mild cardiomegaly. Vascular congestion. Patchy bilateral airspace opacities are slightly improved since prior study. No visible effusions. IMPRESSION: Endotracheal tube in stable position. A NG tube advanced into the proximal stomach. Slight improvement in patchy  bilateral airspace disease and lung volumes. Electronically Signed   By: Rolm Baptise M.D.   On: 12/26/2014 11:59    Labwork from [facility].  Na 141 K 3.9 Cl 102 Co2 30.1 BUN 25 Cr 1.83 Glu 136 Ca 8.7  WBC 8.3 HgB 13.4 Hct 43.3 PLT 141  ASSESSMENT / PLAN: 81 year old male with left corona radiata infarct and altered mentation requiring intubation for airway protection. Slowly improving with regards to FiO2 and oxygen requirement. Patient does have a free water deficit likely contributing to his hypernatremia but given his tenuous respiratory status I'm not initiating aggressive free water replacement. Continuing airway clearance maneuvers at this time. Patient continuing on treatment for aspiration pneumonia. Patient's wife updated at bedside this morning. Status remains critical with a guarded prognosis.   1.  Left corona radiata CVA: Management per neurology. Aspirin, Plavix, & Lipitor. 2. Acute hypoxic respiratory failure: Likely secondary  to aspiration pneumonia. Continuing to wean FiO2 to maintain saturation greater than 94%. Plan for SBT once patient's respiratory status improves. Albuterol nebs q6hr, guaifenesin via tube every 6 hours, & chest PT via bed every 4 hours  to help with secretion clearance. Avoiding further diuresis given elevation in creatinine & hypernatremia. Decreased PEEP to 5 and begin PS trials. 3. Altered mental status:  Much more alert and interactive, per neuro 4. Aspiration pneumonia: Continuing on Unasyn day #7/10. Tracheal aspirate culture showed only oral floor consistent with aspiration. Continuing mucolytic & airway clearance. 5. H/O CAD S/P CABG/ICM:  Appreciate Cardiology recommendations. Likely stress/demand ischemia. Echocardiogram without mention of wall motion abnormality. On Plavix, Lipitor, & ASA.  6. Acute renal failure:  Stable with good UOP. Continuing to trend renal function with daily BUN/creatinine. Also monitoring urine  output with Foley catheter. 7. Hypernatremia: Increase gentle free water replacement with 250 mL via tube every 6 hours.  8. Hypokalemia: Replaced with 60 mEq potassium chloride via tube. Repeat electrolytes at 1500 today. 9. Anemia: Mild. No signs of active bleeding. Continuing to trend hemoglobin daily with CBC. 10. Thrombocytopenia: Resolved. Continue to trend hemoglobin daily with CBC. 11. Hyperglycemia: Good blood glucose control. Hemoglobin A1c 6.2. Accu-Cheks every 4 hours with low-dose sliding scale coverage. 12. Hypomagnesemia:  Resolved. Monitoring magnesium daily with labs. 13. Hypertension: Hydralazine PRN sufficient for now.  14. History of Hyperlipidemia: Lipitor 80mg  via tube qhs. 15. History of Hypothyroidism:  Continuing levothyroxine 150 g via tube daily. 16. Diet: Continuing tube feeds per dietary recommendations.  Prophylaxis: SCDs, Protonix IV daily, & heparin subcutaneous every 8 hours given his acute renal failure.   Will need to discuss trach/peg option with wife when she arrives.  The patient is critically ill with multiple organ systems failure and requires high complexity decision making for assessment and support, frequent evaluation and titration of therapies, application of advanced monitoring technologies and extensive interpretation of multiple databases.   Critical Care Time devoted to patient care services described in this note is  35  Minutes. This time reflects time of care of this signee Dr Jennet Maduro. This critical care time does not reflect procedure time, or teaching time or supervisory time of PA/NP/Med student/Med Resident etc but could involve care discussion time.  Rush Farmer, M.D. [facility] Pulmonary/Critical Care Medicine. Pager: [phone]. After hours pager: [phone].  12/28/2014, 8:16 AM

## 2014-12-29 ENCOUNTER — Inpatient Hospital Stay (HOSPITAL_COMMUNITY): Payer: Medicare HMO

## 2014-12-29 LAB — MAGNESIUM: MAGNESIUM: 2.4 mg/dL (ref 1.7–2.4)

## 2014-12-29 LAB — CBC WITH DIFFERENTIAL/PLATELET
Basophils Absolute: 0 10*3/uL (ref 0.0–0.1)
Basophils Relative: 0 %
EOS ABS: 0.2 10*3/uL (ref 0.0–0.7)
EOS PCT: 2 %
HCT: 36.3 % — ABNORMAL LOW (ref 39.0–52.0)
Hemoglobin: 10.9 g/dL — ABNORMAL LOW (ref 13.0–17.0)
Lymphocytes Relative: 10 %
Lymphs Abs: 1.1 10*3/uL (ref 0.7–4.0)
MCH: 27.7 pg (ref 26.0–34.0)
MCHC: 30 g/dL (ref 30.0–36.0)
MCV: 92.1 fL (ref 78.0–100.0)
MONO ABS: 0.5 10*3/uL (ref 0.1–1.0)
Monocytes Relative: 5 %
NEUTROS ABS: 8.8 10*3/uL — AB (ref 1.7–7.7)
NEUTROS PCT: 83 %
PLATELETS: 212 10*3/uL (ref 150–400)
RBC: 3.94 MIL/uL — ABNORMAL LOW (ref 4.22–5.81)
RDW: 16.4 % — AB (ref 11.5–15.5)
WBC: 10.7 10*3/uL — AB (ref 4.0–10.5)

## 2014-12-29 LAB — BLOOD GAS, ARTERIAL
Acid-Base Excess: 2.6 mmol/L — ABNORMAL HIGH (ref 0.0–2.0)
BICARBONATE: 26.7 meq/L — AB (ref 20.0–24.0)
Drawn by: 44135
FIO2: 0.5
O2 Saturation: 90.2 %
PATIENT TEMPERATURE: 98.6
PCO2 ART: 41.7 mmHg (ref 35.0–45.0)
PEEP: 5 cmH2O
PH ART: 7.422 (ref 7.350–7.450)
PO2 ART: 64.3 mmHg — AB (ref 80.0–100.0)
RATE: 15 resp/min
TCO2: 28 mmol/L (ref 0–100)
VT: 600 mL

## 2014-12-29 LAB — BASIC METABOLIC PANEL
ANION GAP: 8 (ref 5–15)
BUN: 50 mg/dL — AB (ref 6–20)
CALCIUM: 9.1 mg/dL (ref 8.9–10.3)
CO2: 28 mmol/L (ref 22–32)
CREATININE: 1.85 mg/dL — AB (ref 0.61–1.24)
Chloride: 118 mmol/L — ABNORMAL HIGH (ref 101–111)
GFR calc Af Amer: 38 mL/min — ABNORMAL LOW (ref >60)
GFR, EST NON AFRICAN AMERICAN: 33 mL/min — AB (ref >60)
GLUCOSE: 150 mg/dL — AB (ref 65–99)
Potassium: 3.2 mmol/L — ABNORMAL LOW (ref 3.5–5.1)
Sodium: 154 mmol/L — ABNORMAL HIGH (ref 135–145)

## 2014-12-29 LAB — GLUCOSE, CAPILLARY
GLUCOSE-CAPILLARY: 128 mg/dL — AB (ref 65–99)
GLUCOSE-CAPILLARY: 147 mg/dL — AB (ref 65–99)
Glucose-Capillary: 119 mg/dL — ABNORMAL HIGH (ref 65–99)
Glucose-Capillary: 141 mg/dL — ABNORMAL HIGH (ref 65–99)
Glucose-Capillary: 140 mg/dL — ABNORMAL HIGH (ref 65–99)

## 2014-12-29 LAB — PHOSPHORUS: Phosphorus: 3.4 mg/dL (ref 2.5–4.6)

## 2014-12-29 LAB — ALBUMIN: Albumin: 2.2 g/dL — ABNORMAL LOW (ref 3.5–5.0)

## 2014-12-29 MED ORDER — POTASSIUM CHLORIDE 20 MEQ/15ML (10%) PO SOLN
40.0000 meq | Freq: Once | ORAL | Status: AC
Start: 2014-12-29 — End: 2014-12-29
  Administered 2014-12-29: 40 meq
  Filled 2014-12-29: qty 30

## 2014-12-29 MED ORDER — JEVITY 1.2 CAL PO LIQD
1000.0000 mL | ORAL | Status: DC
  Administered 2014-12-30 – 2015-01-01 (×4): 1000 mL
  Filled 2014-12-29 (×7): qty 1000

## 2014-12-29 MED ORDER — PRO-STAT SUGAR FREE PO LIQD
30.0000 mL | Freq: Two times a day (BID) | ORAL | Status: DC
  Administered 2014-12-29 – 2015-01-01 (×6): 30 mL
  Filled 2014-12-29 (×6): qty 30

## 2014-12-29 NOTE — Progress Notes (Signed)
RT increased FIO2 to keep O2 SAT >92% per order.

## 2014-12-29 NOTE — Progress Notes (Signed)
PULMONARY  / [place]   Name: Darius Barber MRN: [id number] DOB: 1931-07-11    ADMISSION DATE:  12/21/2014 CONSULTATION DATE: 12/21/2014  REQUESTING CLINICIAN: Dr. Leonie Man PRIMARY SERVICE: Neurology  CHIEF COMPLAINT:  Stroke  BRIEF PATIENT DESCRIPTION: 75 M transferred from [facility] for VIR evaluation following presumed CVA. GCS 15 on arrival to NSICU but developed AMS with GCS of 7. PCCM consulted for intubation and vent management.  SIGNIFICANT EVENTS: 11/6 - Admitted to hospital  STUDIES:   CT Head 11/7 - Left corona radiata infarct. No hemorrhage. Old infarcts.  TTE 11/7 - LV normal in size. Mild LVH. EF 55-60%. LA normal in size. RA normal in size. RV normal in size and function. No pericardial effusion.   LINES / TUBES: ETT 11/6 >>  CULTURES: Tracheal Aspirate (11/6):  Oral Flora  ANTIBIOTICS: Unasyn 11/6>>  HISTORY OF PRESENT ILLNESS:  Darius Barber is an 4 M with prior CVA, CAD, carotid stenosis who presented to [facility] today for stroke-like symptoms.  He went to VIR on arrival with a normal angiogram. Shortly after arrival to NSICU patient became altered with GCS of 7. PCCM called for emergent intubation.   SUBJECTIVE: afebrile Good UO Weans on PS 5/5 but abdominal breathing  VITAL SIGNS: Temp:  [98.4 F (36.9 C)-99.9 F (37.7 C)] 98.4 F (36.9 C) (11/14 0800) Pulse Rate:  [62-99] 99 (11/14 0800) Resp:  [16-30] 28 (11/14 0800) BP: (90-180)/(53-144) 172/103 mmHg (11/14 0800) SpO2:  [90 %-100 %] 100 % (11/14 0800) FiO2 (%):  [45 %-50 %] 50 % (11/14 0732) Weight:  [92 kg (202 lb 13.2 oz)] 92 kg (202 lb 13.2 oz) (11/14 0401) HEMODYNAMICS:   VENTILATOR SETTINGS: Vent Mode:  [-] PSV;CPAP FiO2 (%):  [45 %-50 %] 50 % Set Rate:  [15 bmp] 15 bmp Vt Set:  [600 mL] 600 mL PEEP:  [5 cmH20] 5 cmH20 Pressure Support:  [5 cmH20-8 cmH20] 5 cmH20 Plateau Pressure:  [20 cmH20-33 cmH20] 20 cmH20   INTAKE / OUTPUT: Intake/Output       11/13 0701 - 11/14 0700 11/14 0701 - 11/15 0700   I.V. (mL/kg) 499.5 (5.4) 20 (0.2)   NG/GT 1700 50   IV Piggyback 200    Total Intake(mL/kg) 2399.5 (26.1) 70 (0.8)   Urine (mL/kg/hr) 1980 (0.9)    Stool 0 (0)    Total Output 1980     Net +419.5 +70        Stool Occurrence 3 x     PHYSICAL EXAMINATION: General:  Intubated. No distress.  Integument:  Warm & dry. No rash on exposed skin.  HEENT:  No scleral injection. Endotracheal tube in place.  Cardiovascular:  Regular rate & rhythm. Unable to appreciate JVD given body habitus.  Pulmonary:  Coarse breath sounds bilaterally persist. Thick, tan secretions unchanged. Symmetric chest wall rise on ventilator. Abdomen: Soft. Normal bowel sounds. Protuberant. Neurological: Patient in spontaneously moves left upper extremity. Babinski persistent right lower extremity.  Follows 1 step commands  LABS:  CBC  Recent Labs Lab 12/27/14 0313 12/28/14 0230 12/29/14 0322  WBC 7.1 9.7 10.7*  HGB 10.5* 11.5* 10.9*  HCT 34.1* 37.9* 36.3*  PLT 168 210 212   Coag's No results for input(s): APTT, INR in the last 168 hours. BMET  Recent Labs Lab 12/27/14 2306 12/28/14 0230 12/29/14 0322  NA 153* 153* 154*  K 3.5 3.4* 3.2*  CL 118* 115* 118*  CO2 28 28 28   BUN 46* 48* 50*  CREATININE 1.76* 1.79* 1.85*  GLUCOSE 175* 158* 150*   Electrolytes  Recent Labs Lab 12/27/14 0313 12/27/14 2306 12/28/14 0230 12/29/14 0322  CALCIUM 9.2 9.3 9.4 9.1  MG 2.3 2.4 2.5* 2.4  PHOS 3.0  --  3.4 3.4   Sepsis Markers No results for input(s): LATICACIDVEN, PROCALCITON, O2SATVEN in the last 168 hours. ABG  Recent Labs Lab 12/28/14 0402 12/29/14 0432  PHART 7.442 7.422  PCO2ART 40.6 41.7  PO2ART 74.1* 64.3*   Liver Enzymes  Recent Labs Lab 12/27/14 0313 12/28/14 0230 12/29/14 0322  ALBUMIN 2.2* 2.4* 2.2*   Cardiac Enzymes  Recent Labs Lab 12/23/14 2105 12/24/14 0314 12/24/14 1005  TROPONINI 1.84* 1.92* 0.90*    Glucose  Recent Labs Lab 12/28/14 1126 12/28/14 1549 12/28/14 1956 12/28/14 2314 12/29/14 0355 12/29/14 0807  GLUCAP 153* 141* 135* 159* 128* 119*   Imaging Dg Chest Port 1 View  12/29/2014  CLINICAL DATA:  Respiratory distress. Intubated patient. Subsequent encounter. EXAM: PORTABLE CHEST 1 VIEW COMPARISON:  12/28/2014 FINDINGS: Allowing for slight differences in lung volume and patient positioning, there has been no significant change in the bilateral airspace lung opacities. Lung volumes remain low. Cardiac silhouette is normal in size. No mediastinal or hilar masses. No pneumothorax. Endotracheal tube and orogastric tube are stable and well positioned. IMPRESSION: 1. No change from the previous day's study. 2. Persistent bilateral airspace lung opacities. 3. Support apparatus is stable and well positioned. Electronically Signed   By: Lajean Manes M.D.   On: 12/29/2014 08:00   Dg Chest Port 1 View  12/28/2014  CLINICAL DATA:  Respiratory failure.  Subsequent encounter. EXAM: PORTABLE CHEST 1 VIEW COMPARISON:  12/27/2014 FINDINGS: Bilateral patchy areas of airspace opacity are without significant change from the previous day's study. Lung volumes remain low. Changes from CABG surgery are stable. Endotracheal tube and orogastric tube are stable and well positioned. No pneumothorax. IMPRESSION: 1. No change from the previous day's study 2. Persistent bilateral airspace opacities most likely due to multifocal pneumonia. Asymmetric edema is possible. 3. Support apparatus is stable and well positioned. Electronically Signed   By: Lajean Manes M.D.   On: 12/28/2014 08:23        ASSESSMENT / PLAN: 81 year old male with left corona radiata infarct and altered mentation requiring intubation for airway protection. Likely  aspiration pneumonia. Patient's wife updated at bedside this morning. Status remains critical with a guarded prognosis.   1. Left corona radiata CVA: Management per  neurology. Aspirin, Plavix, & Lipitor. 2. Acute hypoxic respiratory failure: Likely secondary  to aspiration pneumonia. - SBT with goal extubation. Albuterol nebs q6hr, guaifenesin via tube every 6 hours, & chest PT via bed every 4 hours  to help with secretion clearance.    Aspiration pneumonia: Continuing on Unasyn day #7/10. Tracheal aspirate culture showed only oral floor consistent with aspiration. Continuing mucolytic & airway clearance. 3. H/O CAD S/P CABG/ICM:  Appreciate Cardiology recommendations. Likely stress/demand ischemia. Echocardiogram without mention of wall motion abnormality. On Plavix, Lipitor, & ASA.  Hypertension: Hydralazine PRN sufficient for now.  Hyperlipidemia: Lipitor 80mg  via tube qhs. 4. Acute renal failure:  Stable with good UOP. Continuing to trend renal function with daily BUN/creatinine. 5. Hypernatremia:aloow mild, follow  6. Hypokalemia: Replace prn  7. Anemia: Mild. No signs of active bleeding. Continuing to trend hemoglobin daily with CBC. 8. Thrombocytopenia: Resolved. Continue to trend hemoglobin daily with CBC. 9. Hyperglycemia: Good blood glucose control. Hemoglobin A1c 6.2. Accu-Cheks every 4 hours with low-dose sliding  scale coverage. 10. Hypomagnesemia:  Resolved.  11. History of Hypothyroidism:  Continuing levothyroxine 150 g via tube daily. 12. Diet: Continuing tube feeds per dietary recommendations.  Prophylaxis: SCDs, Protonix IV daily, & heparin subcutaneous every 8 hours given his acute renal failure.   Trial of extubation vs  trach/peg option   The patient is critically ill with multiple organ systems failure and requires high complexity decision making for assessment and support, frequent evaluation and titration of therapies, application of advanced monitoring technologies and extensive interpretation of multiple databases. Critical Care Time devoted to patient care services described in this note independent of APP time is 35 minutes.    Kara Mead MD. Shade Flood. [facility] Pager [phone] If no response call [phone]    12/29/2014, 8:41 AM

## 2014-12-29 NOTE — Progress Notes (Signed)
Pt remains on ventilator; weaning as tolerated.  Wife and daughter at bedside.  Tolerating tube feedings well; continuing IV Unasyn for pneumonia.  PT/OT following...recommending SNF at this time.  Will continue to follow progress.    Reinaldo Raddle, RN, BSN  Trauma/Neuro ICU Case Manager [phone]

## 2014-12-29 NOTE — Progress Notes (Signed)
Occupational Therapy Evaluation Patient Details Name: Darius Barber MRN: [id number] DOB: January 26, 1932 Today's Date: 12/29/2014    History of Present Illness pt presents with L Corona Radiata Infarct with concern for aspiration causing Hypoxic Respiratory Failure, which required intubation on 11/6.  pt with hx of CAD, CABG, CVA, and Colon CA.   Clinical Impression   PTA, pt independent and very active. Pt intubated during eval. Family present. If pt continues to progress, feel he will be a good CIR candidate. Family is able to provide 247 assistance after D/C. Educated staff on goal for pt to maintain "chair position" 45-60 min BID. Began education with family on RUE PROM and positioning. Will continue to follow to address established goals.     Follow Up Recommendations  Supervision/Assistance - 24 hour    Equipment Recommendations  3 in 1 bedside comode;Tub/shower bench    Recommendations for Other Services Rehab consult     Precautions / Restrictions Precautions Precautions: Fall;Other (comment) Precaution Comments: OETT              ADL Overall ADL's : Needs assistance/impaired                                       General ADL Comments: total A at this time although able to hold washcloth and simulate hand to mouth movements  Family reports he has been following commands with his L hand.  Feel pt fatigued this am as he sat EOB with PT earlier.      Vision Additional Comments: L preference. will scan into R field and hold visual attention x 3 seconds. Distracted by others in room at this time.   Pt localizes when name called. Closes eyes on command   Perception Perception Comments: will further assess   Praxis Praxis Praxis-Other Comments: will further assess. Following commands with RUE inconsistently    Pertinent Vitals/Pain Pain Assessment: Faces Faces Pain Scale: No hurt     Hand Dominance Right   Extremity/Trunk Assessment Upper  Extremity Assessment Upper Extremity Assessment: LUE deficits/detail;RUE deficits/detail RUE Deficits / Details: generalized weakness; ROM appears WFL LUE Deficits / Details: no active movement observed; increased tone proximally 2 shoulder; dependent edema R hand LUE Coordination: decreased fine motor;decreased gross motor (nonfunctional at thie time)   Lower Extremity Assessment Lower Extremity Assessment: Defer to PT evaluation RLE Deficits / Details: increased extensor tone   Cervical / Trunk Assessment Cervical / Trunk Assessment:  (unable to assess)   Communication Communication Communication: Other (comment) (ETT)   Cognition Arousal/Alertness: Lethargic;Suspect due to medications Behavior During Therapy: Flat affect Overall Cognitive Status: Difficult to assess                     General Comments       Exercises Exercises: Other exercises Other Exercises Other Exercises: educated family on RUE PROM and positionoing Other Exercises: Educated family on increasing attention to R side Other Exercises: Educated family on keeping R foot supported in flexed position   Shoulder Instructions      Home Living Family/patient expects to be discharged to:: Unsure                                        Prior Functioning/Environment Level of Independence: Independent  Comments: Per family pt was independent and able to drive and perform chores around the house.      OT Diagnosis: Generalized weakness;Hemiplegia dominant side   OT Problem List: Decreased strength;Decreased range of motion;Decreased activity tolerance;Impaired balance (sitting and/or standing);Impaired vision/perception;Decreased coordination;Decreased cognition;Cardiopulmonary status limiting activity;Impaired tone;Impaired UE functional use;Increased edema   OT Treatment/Interventions: Self-care/ADL training;Therapeutic exercise;Neuromuscular education;DME and/or AE  instruction;Therapeutic activities;Cognitive remediation/compensation;Patient/family education;Visual/perceptual remediation/compensation;Balance training    OT Goals(Current goals can be found in the care plan section) Acute Rehab OT Goals Patient Stated Goal: pt unable to state. OT Goal Formulation: With family Potential to Achieve Goals: Good  OT Frequency: Min 2X/week   Barriers to D/C:            Co-evaluation              End of Session Equipment Utilized During Treatment: Other (comment) (on vent) Nurse Communication: Other (comment) (need for chair position BID)  Activity Tolerance: Patient tolerated treatment well Patient left: in bed;Other (comment);with family/visitor present (in chair position)   Time: 1130-1157 OT Time Calculation (min): 27 min Charges:  OT General Charges $OT Visit: 1 Procedure OT Evaluation $Initial OT Evaluation Tier I: 1 Procedure OT Treatments $Therapeutic Activity: 8-22 mins G-Codes:    Aniella Wandrey,HILLARY 01-11-15, 12:24 PM   [facility], OTR/L  [phone] 01/11/15

## 2014-12-29 NOTE — Progress Notes (Signed)
STROKE TEAM PROGRESS NOTE   HISTORY Darius Barber is an 81 y.o. male transfered from [facility] after presenting with acute onset of right sided weakness and aphasia. Evaluated by specialists on call in ED (at [facility]) and given tPA for NIHSS of 20. Transferred to [facility] after tele-neurologist suggested IR evaluation. Per ED notes, patient was last seen normal at 9:00 PM on 12/20/14, ~45 minutes after wife noted abnormal breathing and reported "flexing movements of left arm".  Afterwards, patient was non-verbal, not moving his right side. ED notes mentioned a left gaze preference. CT head imaging reviewed, shows no acute process, signs of an old right MCA infarct. ED physician notes patient has history of fully blocked right ICA and 90% stenosis of L ICA.   Cerebral angiogram performed by Dr. Estanislado Pandy on 12/21/2014, no intervention, no acute findings.   Date last known well: 11/05 Time last known well: 2100 tPA Given: yes Modified Rankin: Rankin Score=0   SUBJECTIVE (INTERVAL HISTORY) Weaning this AM. Still requiring ventilatory support, on 50% FiO2. Still following commands although with significant delay. Therapy is working with the patient.  OBJECTIVE Temp:  [98.4 F (36.9 C)-99.9 F (37.7 C)] 98.4 F (36.9 C) (11/14 0800) Pulse Rate:  [62-100] 89 (11/14 1000) Cardiac Rhythm:  [-] Normal sinus rhythm (11/14 0800) Resp:  [16-29] 23 (11/14 1000) BP: (90-180)/(53-144) 141/74 mmHg (11/14 1000) SpO2:  [89 %-100 %] 90 % (11/14 1000) FiO2 (%):  [45 %-50 %] 50 % (11/14 0732) Weight:  [202 lb 13.2 oz (92 kg)] 202 lb 13.2 oz (92 kg) (11/14 0401)  CBC:   Recent Labs Lab 12/28/14 0230 12/29/14 0322  WBC 9.7 10.7*  NEUTROABS 7.9* 8.8*  HGB 11.5* 10.9*  HCT 37.9* 36.3*  MCV 91.1 92.1  PLT 210 [id number]    Basic Metabolic Panel:   Recent Labs Lab 12/28/14 0230 12/29/14 0322  NA 153* 154*  K 3.4* 3.2*  CL 115* 118*  CO2 28 28  GLUCOSE 158* 150*  BUN 48* 50*  CREATININE 1.79*  1.85*  CALCIUM 9.4 9.1  MG 2.5* 2.4  PHOS 3.4 3.4    Lipid Panel:     Component Value Date/Time   CHOL 129 12/21/2014 0410   TRIG 162* 12/28/2014 0230   HDL 27* 12/21/2014 0410   CHOLHDL 4.8 12/21/2014 0410   VLDL 23 12/21/2014 0410   LDLCALC 79 12/21/2014 0410   Urine Drug Screen:     Component Value Date/Time   LABOPIA NONE DETECTED 12/21/2014 0500   COCAINSCRNUR NONE DETECTED 12/21/2014 0500   LABBENZ POSITIVE* 12/21/2014 0500   AMPHETMU NONE DETECTED 12/21/2014 0500   THCU NONE DETECTED 12/21/2014 0500   LABBARB NONE DETECTED 12/21/2014 0500      IMAGING  Ct Abdomen Wo Contrast 12/21/2014   No evidence of significant hemorrhage within the chest, abdomen or pelvis. Confluent airspace consolidation in the posterior lower lobes. This could represent infectious infiltrate. Interstitial and alveolar edema is probably pulse present bilaterally.   Ct Head Wo Contrast 12/21/2014   No acute intracranial process.  Old RIGHT Chronic changes including old RIGHT MCA territory infarct and old LEFT basal ganglia lacunar infarcts. Findings of normal pressure hydrocephalus are similar.   Ct Chest Wo Contrast 12/21/2014   No evidence of significant hemorrhage within the chest, abdomen or pelvis. Confluent airspace consolidation in the posterior lower lobes. This could represent infectious infiltrate. Interstitial and alveolar edema is probably pulse present bilaterally.   Dg Chest Port 1 View 12/21/2014  Satisfactory ET tube position. No other significant interval change.  CXR 1 view 12/24/14 Worsening bilateral airspace opacification may be due to edema. Pneumonia is not excluded.   MRI Brain 12/21/14 1. Left corona radiata 2.5 cm infarct without hemorrhage or mass effect. 2. No other acute intracranial abnormality. Chronic ischemic disease. Chronic right ICA occlusion. 3. Vertex scalp hematoma.  PHYSICAL EXAM  General: NAD. On ventilator. Follows most commands on the left.    HEENT: PERRL, EOMI. Moist mucus membranes. OETT/OGT in place. Sclera injected Neck: Supple, no lymphadenopathy or carotid bruits Lungs: Air entry equal bilaterally, scattered coarse rhonchi; right worse than left. No wheezes.  Heart: RRR, no murmurs, gallops, or rubs Abdomen: Soft, mildly distended, BS + Extremities: No cyanosis, clulops, or rubs Abdomen: Soft, mildly distendbbing, or edema  Neurological Exam  Mental Status - Following most commands on the left. Patient has a significant delay in following commands. Cranial Nerves II - XII - II - PERRLA, blinks to threat III, IV, VI - Extraocular movements intact with some right-sided neglect. Blinks/closes eyes to command V/VII-  Corneals intact but decreased on the right  VIII - Hearing impaired X - Gag intact. XI - Unable to assess. XII - Unable to assess.  Motor Strength - Strength intact in LUE and LLE, RLE tone intact. RUE 0/5 strength, RLE 2/5 strength. Motor Tone - Muscle tone was assessed at the neck and appendages and was normal.   Sensory - Withdraws weakly to noxious stimuli  Coordination - Unable to assess  Gait and Station - Unable to assess   ASSESSMENT/PLAN Mr. Darius Barber is a 82 y.o. male with history of previous CVA, HLD, h/o Colon CA, HTN, CAD, right carotid artery occlusion, vascular disease, history of a left carotid stent, and renal insufficiency, presenting with aphasia and right-sided weakness. He received IV t-PA at [facility] for NIHSS of 20.    Stroke:  Dominant Left Corona Radiata ischemic infarct likely 2/2 small vessel disease  Resultant  Left hemiplegia, aphasia  MRI: Left corona radiata 2.5 cm infarct without hemorrhage or mass effect.  Catheter Angiogram: Angiographically occluded right internal carotid artery and the left  external carotid artery. Mild intrastent stenosis of the left internal carotid artery.  Carotid Doppler (10/24/2014): Known right internal carotid artery  occlusion, patent left internal  carotid artery with stent 1-39%. Will obtain report from catheter angiogram.  2D Echo: EF 55-60%. PAP 65 mm Hg  EEG: Moderate diffuse slowing   LDL - 79  HgbA1c 6.2  VTE prophylaxis - SCDs, Lovenox 40 mg SQ qd Diet NPO time specified  aspirin 81 mg daily and clopidogrel 75 mg daily prior to admission, both restarted.   Ongoing aggressive stroke risk factor management  Therapy recommendations:  SNF  Disposition: Pending  Infectious Disease  Aspiration Pneumonia:  Management per PCCM.    Continue Unasyn  On chest therapy  CXR pending this AM;   Possible conjunctivitis  Continue antibiotic drops Hypernatremia  Most likely related to diuresis. Free water deficit may need to be judiciously addressed with increasing free water  Acute on CKD  Baseline Cr closer to 1.4, today 1.79  Continue to monitor  Hypothyroidism  Synthroid IV  Hypertension  Stable  Cardene prn gtt for SBP >180       Today, added Norvasc 5mg  daily  GI  Stable  Tolerating tube feeds  Hyperlipidemia  Home meds:  Lipitor 80 mg daily not resumed in hospital. NPO  LDL 79, goal <  70  Add Lipitor 80 mg daily when taking PO  Continue statin at discharge  Other Stroke Risk Factors  Advanced age  Cigarette smoker, quit smoking 35 years ago   Hx stroke/TIA  Coronary artery disease      Natasha Bence, MD PGY-3, Internal Medicine Pager: [phone] I have personally examined this patient, reviewed notes, independently viewed imaging studies, participated in medical decision making and plan of care. I have made any additions or clarifications directly to the above note. Agree with note above. Patient will likely need inpatient rehabilitation(sniff rehabilitation after extubation. I had a long discussion with the patient's wife at the bedside and discuss his neurological condition, prognosis and answered questions. Discussed with Dr. Elsworth Soho pulmonary  critical care physician. Plan to extubate over the next 1-2 days as tolerated This patient is critically ill and at significant risk of neurological worsening, death and care requires constant monitoring of vital signs, hemodynamics,respiratory and cardiac monitoring, extensive review of multiple databases, frequent neurological assessment, discussion with family, other specialists and medical decision making of high complexity.I have made any additions or clarifications directly to the above note.This critical care time does not reflect procedure time, or teaching time or supervisory time of PA/NP/Med Resident etc but could involve care discussion time.  I spent 30 minutes of neurocritical care time  in the care of  this patient.     Antony Contras, MD Medical Director [facility] Pager: [phone] 12/29/2014 1:02 PM      To contact Stroke Continuity provider, please refer to [email or web address] After hours, contact General Neurology

## 2014-12-29 NOTE — Progress Notes (Signed)
Physical Therapy Treatment Patient Details Name: Darius Barber MRN: [id number] DOB: 01/19/1932 Today's Date: 12/29/2014    History of Present Illness pt presents with L Corona Radiata Infarct with concern for aspiration causing Hypoxic Respiratory Failure, which required intubation on 11/6.  pt with hx of CAD, CABG, CVA, and Colon CA.    PT Comments    Progressing slowly, Able to finally sit EOB.  Emphasis on truncal activation, sitting balance and extremity movement.  General aa/PROM to all 4's complete.   Follow Up Recommendations  SNF     Equipment Recommendations  None recommended by PT    Recommendations for Other Services       Precautions / Restrictions Precautions Precautions: Fall;Other (comment) Precaution Comments: OETT    Mobility  Bed Mobility Overal bed mobility: Needs Assistance;+2 for physical assistance Bed Mobility: Rolling;Sidelying to Sit;Sit to Sidelying Rolling: Total assist;+2 for physical assistance Sidelying to sit: Total assist;+2 for physical assistance     Sit to sidelying: Total assist;+2 for physical assistance General bed mobility comments: pt with little ability to assist at this time and is delayed in responses.  Transfers                    Ambulation/Gait                 Stairs            Wheelchair Mobility    Modified Rankin (Stroke Patients Only) Modified Rankin (Stroke Patients Only) Pre-Morbid Rankin Score: No symptoms Modified Rankin: Severe disability     Balance Overall balance assessment: Needs assistance Sitting-balance support: Single extremity supported;Feet supported Sitting balance-Leahy Scale: Zero Sitting balance - Comments: sat EOB for >10 min working on trunal activation, balance and attempts to ellicit R side and LE movements.  Max/total assist with 2nd person assist/guard.                            Cognition   Behavior During Therapy: Flat affect Overall Cognitive  Status: Difficult to assess                      Exercises      General Comments        Pertinent Vitals/Pain Pain Assessment: Faces Faces Pain Scale: No hurt    Home Living                      Prior Function            PT Goals (current goals can now be found in the care plan section) Acute Rehab PT Goals Patient Stated Goal: pt unable to state. PT Goal Formulation: Patient unable to participate in goal setting Time For Goal Achievement: 01/06/15 Potential to Achieve Goals: Good Progress towards PT goals: Progressing toward goals    Frequency  Min 3X/week    PT Plan Current plan remains appropriate    Co-evaluation             End of Session   Activity Tolerance: Patient limited by fatigue Patient left: in bed;with call bell/phone within reach;with family/visitor present     Time: [id number] PT Time Calculation (min) (ACUTE ONLY): 27 min  Charges:  $Therapeutic Activity: 23-37 mins                    G Codes:      Akari Crysler, Tessie Fass 12/29/2014, 10:36  AM 12/29/2014  Donnella Sham, [place] [phone]  (pager)

## 2014-12-29 NOTE — Progress Notes (Addendum)
Nutrition Follow-up   INTERVENTION:  Increase Jevity 1.2 to 65 ml/hr Decrease prostat to 30 ml BID  Provides: 2072 kcal, 116 grams protein, and 1265 ml H2O.  Total free water: 2265 ml  NUTRITION DIAGNOSIS:   Inadequate oral intake related to inability to eat as evidenced by NPO status. Ongoing.   GOAL:   Patient will meet greater than or equal to 90% of their needs Not met now off propofol.  MONITOR:   I & O's, Vent status, Labs, Weight trends, TF tolerance  ASSESSMENT:   81 yo male from [place] with altered mental status, Rt sided weakness, aphasia with concern for CVA. S/P tPA and neuro IR. Acute respiratory failure 2nd to aspiration PNA and compromised airway.  Patient is currently intubated on ventilator support MV: 13.8 L/min Temp (24hrs), Avg:99.2 F (37.3 C), Min:98.4 F (36.9 C), Max:99.9 F (37.7 C)  Propofol: off 11/12  250 ml H2O every 6 hours = 1000 ml Labs reviewed: sodium elevated (154), potassium low (3.4), Magnesium and phosphorus are WNL MD considering trial extubation vs trach/PEG.   Diet Order:  Diet NPO time specified  Skin:  Reviewed, no issues  Last BM:  11/14 via rectal pouch  Height:   Ht Readings from Last 1 Encounters:  12/21/14 _0  (1.753 m)   Weight:   Wt Readings from Last 1 Encounters:  12/29/14 202 lb 13.2 oz (92 kg)   Ideal Body Weight:  72.7 kg  BMI:  Body mass index is 29.94 kg/(m^2).  Estimated Nutritional Needs:   Kcal:  2068  Protein:  110-120 grams  Fluid:  > 1.9 L/day  EDUCATION NEEDS:   No education needs identified at this time  [place], [place], [place] Pager [phone] After Hours Pager

## 2014-12-30 ENCOUNTER — Inpatient Hospital Stay (HOSPITAL_COMMUNITY): Payer: Medicare HMO

## 2014-12-30 LAB — GLUCOSE, CAPILLARY
GLUCOSE-CAPILLARY: 172 mg/dL — AB (ref 65–99)
GLUCOSE-CAPILLARY: 156 mg/dL — AB (ref 65–99)
Glucose-Capillary: 131 mg/dL — ABNORMAL HIGH (ref 65–99)
Glucose-Capillary: 139 mg/dL — ABNORMAL HIGH (ref 65–99)
Glucose-Capillary: 157 mg/dL — ABNORMAL HIGH (ref 65–99)
Glucose-Capillary: 157 mg/dL — ABNORMAL HIGH (ref 65–99)

## 2014-12-30 LAB — CBC WITH DIFFERENTIAL/PLATELET
BASOS ABS: 0 10*3/uL (ref 0.0–0.1)
BASOS PCT: 0 %
EOS PCT: 2 %
Eosinophils Absolute: 0.2 10*3/uL (ref 0.0–0.7)
HEMATOCRIT: 35.2 % — AB (ref 39.0–52.0)
Hemoglobin: 10.6 g/dL — ABNORMAL LOW (ref 13.0–17.0)
Lymphocytes Relative: 7 %
Lymphs Abs: 0.9 10*3/uL (ref 0.7–4.0)
MCH: 27.7 pg (ref 26.0–34.0)
MCHC: 30.1 g/dL (ref 30.0–36.0)
MCV: 91.9 fL (ref 78.0–100.0)
MONO ABS: 0.7 10*3/uL (ref 0.1–1.0)
Monocytes Relative: 5 %
NEUTROS ABS: 11.2 10*3/uL — AB (ref 1.7–7.7)
Neutrophils Relative %: 86 %
PLATELETS: 223 10*3/uL (ref 150–400)
RBC: 3.83 MIL/uL — ABNORMAL LOW (ref 4.22–5.81)
RDW: 16.3 % — AB (ref 11.5–15.5)
WBC: 13 10*3/uL — AB (ref 4.0–10.5)

## 2014-12-30 LAB — MAGNESIUM: Magnesium: 2.4 mg/dL (ref 1.7–2.4)

## 2014-12-30 LAB — RENAL FUNCTION PANEL
ALBUMIN: 2.2 g/dL — AB (ref 3.5–5.0)
Anion gap: 7 (ref 5–15)
BUN: 47 mg/dL — AB (ref 6–20)
CALCIUM: 8.9 mg/dL (ref 8.9–10.3)
CO2: 27 mmol/L (ref 22–32)
Chloride: 118 mmol/L — ABNORMAL HIGH (ref 101–111)
Creatinine, Ser: 1.65 mg/dL — ABNORMAL HIGH (ref 0.61–1.24)
GFR calc Af Amer: 43 mL/min — ABNORMAL LOW (ref >60)
GFR calc non Af Amer: 37 mL/min — ABNORMAL LOW (ref >60)
GLUCOSE: 183 mg/dL — AB (ref 65–99)
PHOSPHORUS: 2.9 mg/dL (ref 2.5–4.6)
Potassium: 3.4 mmol/L — ABNORMAL LOW (ref 3.5–5.1)
SODIUM: 152 mmol/L — AB (ref 135–145)

## 2014-12-30 MED ORDER — POTASSIUM CHLORIDE 20 MEQ/15ML (10%) PO SOLN
40.0000 meq | Freq: Once | ORAL | Status: AC
  Administered 2014-12-30: 40 meq

## 2014-12-30 MED ORDER — CIPROFLOXACIN HCL 0.3 % OP SOLN
2.0000 [drp] | Freq: Two times a day (BID) | OPHTHALMIC | Status: DC
  Administered 2014-12-30 – 2014-12-31 (×2): 2 [drp] via OPHTHALMIC

## 2014-12-30 MED ORDER — POTASSIUM CHLORIDE 20 MEQ/15ML (10%) PO SOLN
20.0000 meq | ORAL | Status: AC
  Administered 2014-12-30 (×2): 20 meq
  Filled 2014-12-30 (×2): qty 15

## 2014-12-30 MED ORDER — HYDRALAZINE HCL 20 MG/ML IJ SOLN
10.0000 mg | INTRAMUSCULAR | Status: DC | PRN
  Administered 2014-12-31 – 2015-01-01 (×3): 20 mg via INTRAVENOUS
  Administered 2015-01-03 – 2015-01-06 (×3): 10 mg via INTRAVENOUS
  Administered 2015-01-07: 20 mg via INTRAVENOUS
  Filled 2014-12-30 (×6): qty 1

## 2014-12-30 MED ORDER — ATENOLOL 25 MG PO TABS
25.0000 mg | ORAL_TABLET | Freq: Every day | ORAL | Status: DC
  Administered 2014-12-30 – 2015-01-04 (×6): 25 mg via ORAL
  Filled 2014-12-30 (×7): qty 1

## 2014-12-30 NOTE — Progress Notes (Signed)
PULMONARY  / [place]   Name: Darius Barber MRN: [id number] DOB: April 30, 1931    ADMISSION DATE:  12/21/2014 CONSULTATION DATE: 12/21/2014  REQUESTING CLINICIAN: Dr. Leonie Man PRIMARY SERVICE: Neurology  CHIEF COMPLAINT:  Stroke  BRIEF PATIENT DESCRIPTION: 53 M transferred from [facility] for VIR evaluation following presumed CVA. He went to VIR on arrival with a normal angiogram. GCS 15 on arrival to NSICU but developed AMS with GCS of 7. PCCM consulted for intubation and vent management. prior CVA, CAD, carotid stenosis   SIGNIFICANT EVENTS: 11/6 - Admitted to hospital  STUDIES:   CT Head 11/7 - Left corona radiata infarct. No hemorrhage. Old infarcts.  TTE 11/7 - LV normal in size. Mild LVH. EF 55-60%. LA normal in size. RA normal in size. RV normal in size and function. No pericardial effusion.   LINES / TUBES: ETT 11/6 >>  CULTURES: Tracheal Aspirate (11/6):  Oral Flora  ANTIBIOTICS: Unasyn 11/6>>   SUBJECTIVE: afebrile Good UO Weans on PS 12/5 but abdominal breathing when supine  VITAL SIGNS: Temp:  [97.4 F (36.3 C)-99.4 F (37.4 C)] 98.8 F (37.1 C) (11/15 0808) Pulse Rate:  [36-109] 92 (11/15 0717) Resp:  [16-35] 26 (11/15 0717) BP: (130-198)/(53-162) 187/77 mmHg (11/15 0717) SpO2:  [89 %-99 %] 94 % (11/15 0717) FiO2 (%):  [50 %] 50 % (11/15 0717) Weight:  [92.2 kg (203 lb 4.2 oz)] 92.2 kg (203 lb 4.2 oz) (11/15 0330) HEMODYNAMICS:   VENTILATOR SETTINGS: Vent Mode:  [-] PSV;CPAP FiO2 (%):  [50 %] 50 % Set Rate:  [15 bmp] 15 bmp Vt Set:  [600 mL] 600 mL PEEP:  [5 cmH20] 5 cmH20 Pressure Support:  [12 cmH20-15 cmH20] 12 cmH20 Plateau Pressure:  [6 cmH20] 6 cmH20   INTAKE / OUTPUT: Intake/Output      11/14 0701 - 11/15 0700 11/15 0701 - 11/16 0700   I.V. (mL/kg) 460 (5)    NG/GT 1825    IV Piggyback 300    Total Intake(mL/kg) 2585 (28)    Urine (mL/kg/hr) 885 (0.4)    Stool 0 (0)    Total Output 885     Net +1700           Urine Occurrence 4 x    Stool Occurrence 1 x     PHYSICAL EXAMINATION: General:  Intubated. No distress.  Integument:  Warm & dry. No rash on exposed skin.  HEENT:  No scleral injection. Endotracheal tube in place.  Cardiovascular:  Regular rate & rhythm. Unable to appreciate JVD given body habitus.  Pulmonary:  Coarse breath sounds bilaterally persist. Thick, tan secretions unchanged. Symmetric chest wall rise on ventilator. Abdomen: Soft. Normal bowel sounds. Protuberant. Neurological: spontaneously moves left upper extremity. Babinski persistent right lower extremity.  Follows 1 step commands  LABS:  CBC  Recent Labs Lab 12/28/14 0230 12/29/14 0322 12/30/14 0333  WBC 9.7 10.7* 13.0*  HGB 11.5* 10.9* 10.6*  HCT 37.9* 36.3* 35.2*  PLT 210 212 223   Coag's No results for input(s): APTT, INR in the last 168 hours. BMET  Recent Labs Lab 12/28/14 0230 12/29/14 0322 12/30/14 0333  NA 153* 154* 152*  K 3.4* 3.2* 3.4*  CL 115* 118* 118*  CO2 28 28 27   BUN 48* 50* 47*  CREATININE 1.79* 1.85* 1.65*  GLUCOSE 158* 150* 183*   Electrolytes  Recent Labs Lab 12/28/14 0230 12/29/14 0322 12/30/14 0333  CALCIUM 9.4 9.1 8.9  MG 2.5* 2.4 2.4  PHOS 3.4  3.4 2.9   Sepsis Markers No results for input(s): LATICACIDVEN, PROCALCITON, O2SATVEN in the last 168 hours. ABG  Recent Labs Lab 12/28/14 0402 12/29/14 0432  PHART 7.442 7.422  PCO2ART 40.6 41.7  PO2ART 74.1* 64.3*   Liver Enzymes  Recent Labs Lab 12/28/14 0230 12/29/14 0322 12/30/14 0333  ALBUMIN 2.4* 2.2* 2.2*   Cardiac Enzymes  Recent Labs Lab 12/23/14 2105 12/24/14 0314 12/24/14 1005  TROPONINI 1.84* 1.92* 0.90*   Glucose  Recent Labs Lab 12/29/14 1223 12/29/14 1637 12/29/14 2015 12/30/14 0010 12/30/14 0411 12/30/14 0805  GLUCAP 141* 147* 140* 157* 157* 131*   Imaging Dg Chest Port 1 View  12/30/2014  CLINICAL DATA:  Acute respiratory failure, hypoxemia EXAM: PORTABLE CHEST  1 VIEW COMPARISON:  Portable chest x-ray of 12/29/2014, CT chest of 12/21/2014 and portable chest x-ray of 12/20/2014 FINDINGS: Airspace disease remains throughout the lungs bilaterally although there is more opacification in the left upper lobe. In view of the development of this left upper lobe opacity since the chest x-ray of 11 /5, this is most consistent with an inflammatory or infectious process versus hemorrhage. Cardiomegaly is stable. The endotracheal tube is unchanged in position. and NG tube is noted. IMPRESSION: Bilateral airspace disease greatest in the left upper low suspicious for an infectious, inflammatory, or possibly hemorrhagic process. Electronically Signed   By: Ivar Drape M.D.   On: 12/30/2014 08:06   Dg Chest Port 1 View  12/29/2014  CLINICAL DATA:  Respiratory distress. Intubated patient. Subsequent encounter. EXAM: PORTABLE CHEST 1 VIEW COMPARISON:  12/28/2014 FINDINGS: Allowing for slight differences in lung volume and patient positioning, there has been no significant change in the bilateral airspace lung opacities. Lung volumes remain low. Cardiac silhouette is normal in size. No mediastinal or hilar masses. No pneumothorax. Endotracheal tube and orogastric tube are stable and well positioned. IMPRESSION: 1. No change from the previous day's study. 2. Persistent bilateral airspace lung opacities. 3. Support apparatus is stable and well positioned. Electronically Signed   By: Lajean Manes M.D.   On: 12/29/2014 08:00        ASSESSMENT / PLAN: 81 year old male with left corona radiata infarct and altered mentation requiring intubation for airway protection. Likely  aspiration pneumonia. Patient's wife updated at bedside this morning. Status remains critical with a guarded prognosis.   1. Left corona radiata CVA: Management per neurology. Aspirin, Plavix, & Lipitor. 2. Acute hypoxic respiratory failure: Likely secondary  to aspiration pneumonia. - SBT with goal extubation.  Albuterol nebs q6hr, guaifenesin via tube every 6 hours, & chest PT via bed every 4 hours  to help with secretion clearance.    Aspiration pneumonia: Continuing on Unasyn day x10 ds. Tracheal aspirate culture showed only oral floor consistent with aspiration.  3. H/O CAD S/P CABG/ICM:  Appreciate Cardiology recommendations. Likely stress/demand ischemia. Echocardiogram -no  wall motion abnormality. On Plavix, Lipitor, & ASA.  Hypertension: resume aten, ct amlodipin, Hydralazine PRN  for now.  Hyperlipidemia: Lipitor 80mg  via tube qhs. 4. Acute renal failure:  Stable with good UOP. Continuing to trend renal function with daily BUN/creatinine. Hold losartan 5. Hypernatremia:allow mild, follow  6. Hypokalemia: Replace prn  7. Anemia: Mild. No signs of active bleeding. Continuing to trend hemoglobin daily with CBC. 8. Thrombocytopenia: Resolved.  9. Hyperglycemia: Good blood glucose control. Hemoglobin A1c 6.2. Accu-Cheks every 4 hours with low-dose sliding scale coverage. 10. Hypomagnesemia:  Resolved.  11. History of Hypothyroidism:  Continuing levothyroxine 150 g via tube daily. 12. Diet:  Continuing tube feeds per dietary recommendations.  Prophylaxis: SCDs, Protonix IV daily, & heparin subcutaneous every 8 hours given his acute renal failure.   Trial of extubation vs  trach/peg option - will d/w wife   The patient is critically ill with multiple organ systems failure and requires high complexity decision making for assessment and support, frequent evaluation and titration of therapies, application of advanced monitoring technologies and extensive interpretation of multiple databases. Critical Care Time devoted to patient care services described in this note independent of APP time is 31 minutes.   Kara Mead MD. Shade Flood. [place] [facility] Pager [phone] If no response call [phone]    12/30/2014, 8:33 AM

## 2014-12-30 NOTE — Progress Notes (Signed)
[facility] ADULT ICU REPLACEMENT PROTOCOL FOR AM LAB REPLACEMENT ONLY  The patient does apply for the [facility] Adult ICU Electrolyte Replacment Protocol based on the criteria listed below:   1. Is GFR >/= 40 ml/min? Yes.    Patient's GFR today is 43 2. Is urine output >/= 0.5 ml/kg/hr for the last 6 hours? Yes.   Patient's UOP is 0.5 ml/kg/hr 3. Is BUN < 60 mg/dL? Yes.    Patient's BUN today is 47 4. Abnormal electrolyte(s): K 3.4 5. Ordered repletion with: protocol 6. If a panic level lab has been reported, has the CCM MD in charge been notified? Yes.  .   Physician:  Claretha Cooper, Khoi Hamberger A 12/30/2014 4:33 AM

## 2014-12-30 NOTE — Progress Notes (Signed)
Dr. Leonie Man came to bedside and saw pt. He assessed his mental status and felt it was due to lack of sleep. Will continue to monitor closely.

## 2014-12-30 NOTE — Progress Notes (Signed)
1130 pt got more tachypneic and diaphoretic. RT paged. She lavaged the pt and suctioned out bloody, tan, thick secretions. Pt placed back on full support.  Pt will not open his eyes to loud voice or follow commands like before. Dr. Leonie Man notified and he will be here to assess.

## 2014-12-30 NOTE — Progress Notes (Signed)
Since this nurse arrival, noted pt in/out of bigemity. Pt had 5 beat run nonsustained Vtach and 3 beat run.  MD notified. Discussed labs, previous nonsustained vtach. Orders received, will continue to monitor.

## 2014-12-30 NOTE — Progress Notes (Signed)
eLink Physician-Brief Progress Note Patient Name: Darius Barber DOB: [birth year] MRN: [id number]   Date of Service  12/30/2014  HPI/Events of Note  pvc  eICU Interventions  Get lytes     Intervention Category Minor Interventions: Clinical assessment - ordering diagnostic tests  Raylene Miyamoto. 12/30/2014, 3:08 AM

## 2014-12-30 NOTE — Progress Notes (Signed)
12:00 CPT held at this time due to pt.'s RR in the upper 30's-40's, increased work of breathing & pt. No longer following commands. RN has made MD aware of pt.'s change in mental status.

## 2014-12-31 ENCOUNTER — Inpatient Hospital Stay (HOSPITAL_COMMUNITY): Payer: Medicare HMO

## 2014-12-31 LAB — CBC WITH DIFFERENTIAL/PLATELET
BASOS ABS: 0 10*3/uL (ref 0.0–0.1)
Basophils Relative: 0 %
EOS ABS: 0.2 10*3/uL (ref 0.0–0.7)
EOS PCT: 2 %
HCT: 36.7 % — ABNORMAL LOW (ref 39.0–52.0)
Hemoglobin: 10.8 g/dL — ABNORMAL LOW (ref 13.0–17.0)
LYMPHS ABS: 1 10*3/uL (ref 0.7–4.0)
Lymphocytes Relative: 7 %
MCH: 27.2 pg (ref 26.0–34.0)
MCHC: 29.4 g/dL — AB (ref 30.0–36.0)
MCV: 92.4 fL (ref 78.0–100.0)
Monocytes Absolute: 0.4 10*3/uL (ref 0.1–1.0)
Monocytes Relative: 3 %
Neutro Abs: 12.5 10*3/uL — ABNORMAL HIGH (ref 1.7–7.7)
Neutrophils Relative %: 88 %
Platelets: 259 10*3/uL (ref 150–400)
RBC: 3.97 MIL/uL — AB (ref 4.22–5.81)
RDW: 16.6 % — AB (ref 11.5–15.5)
WBC: 14.1 10*3/uL — AB (ref 4.0–10.5)

## 2014-12-31 LAB — MAGNESIUM: Magnesium: 2.5 mg/dL — ABNORMAL HIGH (ref 1.7–2.4)

## 2014-12-31 LAB — GLUCOSE, CAPILLARY
GLUCOSE-CAPILLARY: 165 mg/dL — AB (ref 65–99)
GLUCOSE-CAPILLARY: 171 mg/dL — AB (ref 65–99)
GLUCOSE-CAPILLARY: 156 mg/dL — AB (ref 65–99)
GLUCOSE-CAPILLARY: 143 mg/dL — AB (ref 65–99)
GLUCOSE-CAPILLARY: 151 mg/dL — AB (ref 65–99)
Glucose-Capillary: 141 mg/dL — ABNORMAL HIGH (ref 65–99)

## 2014-12-31 LAB — RENAL FUNCTION PANEL
ANION GAP: 8 (ref 5–15)
Albumin: 2.1 g/dL — ABNORMAL LOW (ref 3.5–5.0)
BUN: 59 mg/dL — ABNORMAL HIGH (ref 6–20)
CO2: 27 mmol/L (ref 22–32)
Calcium: 9 mg/dL (ref 8.9–10.3)
Chloride: 117 mmol/L — ABNORMAL HIGH (ref 101–111)
Creatinine, Ser: 2.13 mg/dL — ABNORMAL HIGH (ref 0.61–1.24)
GFR calc Af Amer: 32 mL/min — ABNORMAL LOW (ref >60)
GFR calc non Af Amer: 27 mL/min — ABNORMAL LOW (ref >60)
GLUCOSE: 197 mg/dL — AB (ref 65–99)
POTASSIUM: 3.8 mmol/L (ref 3.5–5.1)
Phosphorus: 3.7 mg/dL (ref 2.5–4.6)
Sodium: 152 mmol/L — ABNORMAL HIGH (ref 135–145)

## 2014-12-31 NOTE — Progress Notes (Signed)
PT Cancellation Note  Patient Details Name: Darius Barber MRN: [id number] DOB: 09-Jan-1932   Cancelled Treatment:    Reason Eval/Treat Not Completed: Medical issues which prohibited therapy.  Spoke with RN and will hold PT and mobility today as pt trying to wean.     Amish Mintzer, Thornton Papas 12/31/2014, 10:52 AM

## 2014-12-31 NOTE — Progress Notes (Signed)
PULMONARY  / [place]   Name: Darius Barber MRN: [id number] DOB: 06-Nov-1931    ADMISSION DATE:  12/21/2014 CONSULTATION DATE: 12/21/2014  REQUESTING CLINICIAN: Dr. Leonie Man PRIMARY SERVICE: Neurology  CHIEF COMPLAINT:  Stroke  BRIEF PATIENT DESCRIPTION: 16 M transferred from [facility] for VIR evaluation following presumed CVA. He went to VIR on arrival with a normal angiogram. GCS 15 on arrival to NSICU but developed AMS with GCS of 7. PCCM consulted for intubation and vent management. prior CVA, CAD, carotid stenosis Course complicated by aspiration pneumonia & prolonged ventilation   SIGNIFICANT EVENTS: 11/6 - Admitted to hospital 11/14 weans on PS 12/5  STUDIES:   CT Head 11/7 - Left corona radiata infarct. No hemorrhage. Old infarcts.  TTE 11/7 - LV normal in size. Mild LVH. EF 55-60%. LA normal in size. RA normal in size. RV normal in size and function. No pericardial effusion.   LINES / TUBES: ETT 11/6 >>  CULTURES: Tracheal Aspirate (11/6):  Oral Flora  ANTIBIOTICS: Unasyn 11/6>>   SUBJECTIVE: afebrile Good UO with lasix Bloody secretions Weans on PS 10/5  VITAL SIGNS: Temp:  [98 F (36.7 C)-99.1 F (37.3 C)] 98.6 F (37 C) (11/16 0400) Pulse Rate:  [59-82] 78 (11/16 0803) Resp:  [16-32] 24 (11/16 0803) BP: (108-196)/(46-78) 182/71 mmHg (11/16 0803) SpO2:  [88 %-97 %] 96 % (11/16 0803) FiO2 (%):  [50 %] 50 % (11/16 0803) Weight:  [94.1 kg (207 lb 7.3 oz)] 94.1 kg (207 lb 7.3 oz) (11/16 0500) HEMODYNAMICS:   VENTILATOR SETTINGS: Vent Mode:  [-] PRVC FiO2 (%):  [50 %] 50 % Set Rate:  [15 bmp] 15 bmp Vt Set:  [600 mL] 600 mL PEEP:  [5 cmH20] 5 cmH20 Plateau Pressure:  [27 cmH20-28 cmH20] 27 cmH20   INTAKE / OUTPUT: Intake/Output      11/15 0701 - 11/16 0700 11/16 0701 - 11/17 0700   I.V. (mL/kg) 480 (5.1)    NG/GT 2300    IV Piggyback 300    Total Intake(mL/kg) 3080 (32.7)    Urine (mL/kg/hr) 905 (0.4)    Stool  1 (0)    Total Output 906     Net +2174          Stool Occurrence 1 x     PHYSICAL EXAMINATION: General:  Intubated. No distress.  Integument:  Warm & dry. No rash on exposed skin.  HEENT:  No scleral injection. Endotracheal tube in place.  Cardiovascular:  Regular rate & rhythm. Unable to appreciate JVD given body habitus.  Pulmonary:  Coarse breath sounds bilaterally persist. Thick, tan secretions unchanged. Symmetric chest wall rise  Abdomen: Soft. Normal bowel sounds. Protuberant. Neurological: spontaneously moves left upper extremity. Babinski persistent right lower extremity.  Follows 1 step commands  LABS:  CBC  Recent Labs Lab 12/29/14 0322 12/30/14 0333 12/31/14 0436  WBC 10.7* 13.0* 14.1*  HGB 10.9* 10.6* 10.8*  HCT 36.3* 35.2* 36.7*  PLT 212 223 259   Coag's No results for input(s): APTT, INR in the last 168 hours. BMET  Recent Labs Lab 12/29/14 0322 12/30/14 0333 12/31/14 0436  NA 154* 152* 152*  K 3.2* 3.4* 3.8  CL 118* 118* 117*  CO2 28 27 27   BUN 50* 47* 59*  CREATININE 1.85* 1.65* 2.13*  GLUCOSE 150* 183* 197*   Electrolytes  Recent Labs Lab 12/29/14 0322 12/30/14 0333 12/31/14 0436  CALCIUM 9.1 8.9 9.0  MG 2.4 2.4 2.5*  PHOS 3.4 2.9 3.7  Sepsis Markers No results for input(s): LATICACIDVEN, PROCALCITON, O2SATVEN in the last 168 hours. ABG  Recent Labs Lab 12/28/14 0402 12/29/14 0432  PHART 7.442 7.422  PCO2ART 40.6 41.7  PO2ART 74.1* 64.3*   Liver Enzymes  Recent Labs Lab 12/29/14 0322 12/30/14 0333 12/31/14 0436  ALBUMIN 2.2* 2.2* 2.1*   Cardiac Enzymes  Recent Labs Lab 12/24/14 1005  TROPONINI 0.90*   Glucose  Recent Labs Lab 12/30/14 0411 12/30/14 0805 12/30/14 1202 12/30/14 1618 12/30/14 2018 12/31/14 0018  GLUCAP 157* 131* 172* 156* 139* 141*   Imaging Dg Chest Port 1 View  12/30/2014  CLINICAL DATA:  Acute respiratory failure, hypoxemia EXAM: PORTABLE CHEST 1 VIEW COMPARISON:  Portable chest  x-ray of 12/29/2014, CT chest of 12/21/2014 and portable chest x-ray of 12/20/2014 FINDINGS: Airspace disease remains throughout the lungs bilaterally although there is more opacification in the left upper lobe. In view of the development of this left upper lobe opacity since the chest x-ray of 11 /5, this is most consistent with an inflammatory or infectious process versus hemorrhage. Cardiomegaly is stable. The endotracheal tube is unchanged in position. and NG tube is noted. IMPRESSION: Bilateral airspace disease greatest in the left upper low suspicious for an infectious, inflammatory, or possibly hemorrhagic process. Electronically Signed   By: Ivar Drape M.D.   On: 12/30/2014 08:06        ASSESSMENT / PLAN: 81 year old male with left corona radiata infarct and altered mentation requiring intubation for airway protection. Likely  aspiration pneumonia.   1. Left corona radiata CVA: Management per neurology. Aspirin, Plavix, & Lipitor. 2. Acute hypoxic respiratory failure: Likely secondary  to aspiration pneumonia. -  Ct SBT attempts with goal extubation. Albuterol nebs q6hr, guaifenesin via tube every 6 hours, & chest PT via bed every 4 hours  to help with secretion clearance.    Aspiration pneumonia: Continuing on Unasyn day x10 ds. Tracheal aspirate culture showed only oral flora consistent with aspiration.  3. H/O CAD S/P CABG/ICM:  Appreciate Cardiology recommendations. Likely stress/demand ischemia. Echocardiogram -no  wall motion abnormality. On Plavix, Lipitor, & ASA.  Hypertension: resumed aten, ct amlodipin, Hydralazine PRN  for now.  Hyperlipidemia: Lipitor 80mg  via tube qhs. 4. Acute renal failure:  worse with good UOP. Continuing to trend renal function with daily BUN/creatinine. Hold losartan 5. Hypernatremia:allow mild, follow  6. Hypokalemia: Replace prn  7. Anemia: Mild. No signs of active bleeding. Continuing to trend hemoglobin daily with CBC. 8. Thrombocytopenia:  Resolved.  9. Hyperglycemia: Good blood glucose control. Hemoglobin A1c 6.2. Accu-Cheks every 4 hours with low-dose sliding scale coverage. 10. Hypomagnesemia:  Resolved.  11. History of Hypothyroidism:  Continuing levothyroxine 150 g via tube daily. 12. Diet: Continuing tube feeds per dietary recommendations.  Prophylaxis: SCDs, Protonix IV daily, & heparin subcutaneous every 8 hours given his acute renal failure.   Trial of extubation vs  trach/peg option -  d/w wife & adughter 11/15 - if no progress plan for trach soon   The patient is critically ill with multiple organ systems failure and requires high complexity decision making for assessment and support, frequent evaluation and titration of therapies, application of advanced monitoring technologies and extensive interpretation of multiple databases. Critical Care Time devoted to patient care services described in this note independent of APP time is 31 minutes.   Kara Mead MD. Shade Flood. [place] [facility] Pager [phone] If no response call [phone]    12/31/2014, 8:24 AM

## 2014-12-31 NOTE — Progress Notes (Signed)
OT Cancellation Note  Patient Details Name: Darius Barber MRN: [id number] DOB: 1931/12/06   Cancelled Treatment:    Reason Eval/Treat Not Completed: Medical issues which prohibited therapy (pt weaning from vent, RN requesting to defer, will continue to follow.)  [name] 12/31/2014, 10:52 AM  [phone]

## 2014-12-31 NOTE — Progress Notes (Signed)
Met with pt's daughter at bedside.  Pt attempting to wean from vent; likely tracheostomy on Friday, November 18.  Discussed possibility of transfer to LTAC with daughter post trach, and she seems agreeable to this.  Daughter states Dr. Elsworth Soho had mentioned this to her earlier today.    She prefers [facility] [employer], if possible.  She plans to discuss LTAC with her stepmother.    MD: will need LTAC referral order when appropriate.  Will continue to follow pt progress.    Reinaldo Raddle, RN, BSN  Trauma/Neuro ICU Case Manager [phone]

## 2014-12-31 NOTE — Progress Notes (Signed)
STROKE TEAM PROGRESS NOTE   HISTORY Darius Barber is an 81 y.o. male transfered from [facility] after presenting with acute onset of right sided weakness and aphasia. Evaluated by specialists on call in ED (at [facility]) and given tPA for NIHSS of 20. Transferred to [facility] after tele-neurologist suggested IR evaluation. Per ED notes, patient was last seen normal at 9:00 PM on 12/20/14, ~45 minutes after wife noted abnormal breathing and reported "flexing movements of left arm".  Afterwards, patient was non-verbal, not moving his right side. ED notes mentioned a left gaze preference. CT head imaging reviewed, shows no acute process, signs of an old right MCA infarct. ED physician notes patient has history of fully blocked right ICA and 90% stenosis of L ICA.   Cerebral angiogram performed by Dr. Estanislado Pandy on 12/21/2014, no intervention, no acute findings.   Date last known well: 11/05 Time last known well: 2100 tPA Given: yes Modified Rankin: Rankin Score=0   SUBJECTIVE (INTERVAL HISTORY) Looks better this am Weaning this AM. Still requiring ventilatory support,  . Still following commands although with slight delay. Therapy is working with the patient.  OBJECTIVE Temp:  [97.3 F (36.3 C)-99.1 F (37.3 C)] 97.3 F (36.3 C) (11/16 1142) Pulse Rate:  [59-82] 69 (11/16 1639) Cardiac Rhythm:  [-] Normal sinus rhythm (11/16 1200) Resp:  [16-27] 22 (11/16 1639) BP: (138-195)/(49-76) 177/71 mmHg (11/16 1639) SpO2:  [90 %-97 %] 95 % (11/16 1639) FiO2 (%):  [50 %] 50 % (11/16 1639) Weight:  [207 lb 7.3 oz (94.1 kg)] 207 lb 7.3 oz (94.1 kg) (11/16 0500)  CBC:   Recent Labs Lab 12/30/14 0333 12/31/14 0436  WBC 13.0* 14.1*  NEUTROABS 11.2* 12.5*  HGB 10.6* 10.8*  HCT 35.2* 36.7*  MCV 91.9 92.4  PLT 223 [id number]    Basic Metabolic Panel:   Recent Labs Lab 12/30/14 0333 12/31/14 0436  NA 152* 152*  K 3.4* 3.8  CL 118* 117*  CO2 27 27  GLUCOSE 183* 197*  BUN 47* 59*  CREATININE  1.65* 2.13*  CALCIUM 8.9 9.0  MG 2.4 2.5*  PHOS 2.9 3.7    Lipid Panel:     Component Value Date/Time   CHOL 129 12/21/2014 0410   TRIG 162* 12/28/2014 0230   HDL 27* 12/21/2014 0410   CHOLHDL 4.8 12/21/2014 0410   VLDL 23 12/21/2014 0410   LDLCALC 79 12/21/2014 0410   Urine Drug Screen:     Component Value Date/Time   LABOPIA NONE DETECTED 12/21/2014 0500   COCAINSCRNUR NONE DETECTED 12/21/2014 0500   LABBENZ POSITIVE* 12/21/2014 0500   AMPHETMU NONE DETECTED 12/21/2014 0500   THCU NONE DETECTED 12/21/2014 0500   LABBARB NONE DETECTED 12/21/2014 0500      IMAGING  Ct Abdomen Wo Contrast 12/21/2014   No evidence of significant hemorrhage within the chest, abdomen or pelvis. Confluent airspace consolidation in the posterior lower lobes. This could represent infectious infiltrate. Interstitial and alveolar edema is probably pulse present bilaterally.   Ct Head Wo Contrast 12/21/2014   No acute intracranial process.  Old RIGHT Chronic changes including old RIGHT MCA territory infarct and old LEFT basal ganglia lacunar infarcts. Findings of normal pressure hydrocephalus are similar.   Ct Chest Wo Contrast 12/21/2014   No evidence of significant hemorrhage within the chest, abdomen or pelvis. Confluent airspace consolidation in the posterior lower lobes. This could represent infectious infiltrate. Interstitial and alveolar edema is probably pulse present bilaterally.   Dg Chest [facility] 1 [address]  12/21/2014   Satisfactory ET tube position. No other significant interval change.  CXR 1 view 12/24/14 Worsening bilateral airspace opacification may be due to edema. Pneumonia is not excluded.   MRI Brain 12/21/14 1. Left corona radiata 2.5 cm infarct without hemorrhage or mass effect. 2. No other acute intracranial abnormality. Chronic ischemic disease. Chronic right ICA occlusion. 3. Vertex scalp hematoma.  PHYSICAL EXAM  General: NAD. On ventilator. Follows most commands on the  left.   HEENT: PERRL, EOMI. Moist mucus membranes. OETT/OGT in place. Sclera injected Neck: Supple, no lymphadenopathy or carotid bruits Lungs: Air entry equal bilaterally, scattered coarse rhonchi; right worse than left. No wheezes.  Heart: RRR, no murmurs, gallops, or rubs Abdomen: Soft, mildly distended, BS + Extremities: No cyanosis, clulops, or rubs Abdomen: Soft, mildly distendbbing, or edema  Neurological Exam  Mental Status - Following most commands on the left. Patient has a significant delay in following commands. Cranial Nerves II - XII - II - PERRLA, blinks to threat III, IV, VI - Extraocular movements intact with some right-sided neglect. Blinks/closes eyes to command V/VII-  Corneals intact but decreased on the right  VIII - Hearing impaired X - Gag intact. XI - Unable to assess. XII - Unable to assess.  Motor Strength - Strength intact in LUE and LLE, RLE tone intact. RUE 0/5 strength, RLE 2/5 strength. Motor Tone - Muscle tone was assessed at the neck and appendages and was normal.   Sensory - Withdraws weakly to noxious stimuli  Coordination - Unable to assess  Gait and Station - Unable to assess   ASSESSMENT/PLAN Mr. Darius Barber is a 81 y.o. male with history of previous CVA, HLD, h/o Colon CA, HTN, CAD, right carotid artery occlusion, vascular disease, history of a left carotid stent, and renal insufficiency, presenting with aphasia and right-sided weakness. He received IV t-PA at [facility] for NIHSS of 20.    Stroke:  Dominant Left Corona Radiata ischemic infarct likely 2/2 small vessel disease  Resultant  Left hemiplegia, aphasia  MRI: Left corona radiata 2.5 cm infarct without hemorrhage or mass effect.  Catheter Angiogram: Angiographically occluded right internal carotid artery and the left  external carotid artery. Mild intrastent stenosis of the left internal carotid artery.  Carotid Doppler (10/24/2014): Known right internal carotid artery  occlusion, patent left internal  carotid artery with stent 1-39%. Will obtain report from catheter angiogram.  2D Echo: EF 55-60%. PAP 65 mm Hg  EEG: Moderate diffuse slowing   LDL - 79  HgbA1c 6.2  VTE prophylaxis - SCDs, Lovenox 40 mg SQ qd Diet NPO time specified  aspirin 81 mg daily and clopidogrel 75 mg daily prior to admission, both restarted.   Ongoing aggressive stroke risk factor management  Therapy recommendations:  SNF  Disposition: Pending  Infectious Disease  Aspiration Pneumonia:  Management per PCCM.    Continue Unasyn  On chest therapy  CXR pending this AM;   Possible conjunctivitis  Continue antibiotic drops Hypernatremia  Most likely related to diuresis. Free water deficit may need to be judiciously addressed with increasing free water  Acute on CKD  Baseline Cr closer to 1.4, today 1.79  Continue to monitor  Hypothyroidism  Synthroid IV  Hypertension  Stable  Cardene prn gtt for SBP >180       Today, added Norvasc 5mg  daily  GI  Stable  Tolerating tube feeds  Hyperlipidemia  Home meds:  Lipitor 80 mg daily not resumed in hospital. NPO  LDL  79, goal < 70  Add Lipitor 80 mg daily when taking PO  Continue statin at discharge  Other Stroke Risk Factors  Advanced age  Cigarette smoker, quit smoking 35 years ago   Hx stroke/TIA  Coronary artery disease    I have personally examined this patient, reviewed notes, independently viewed imaging studies, participated in medical decision making and plan of care. I have made any additions or clarifications directly to the above note. Agree with note above. Patient will likely need inpatient rehabilitation(sniff rehabilitation after extubation. I had a long discussion with the patient's wife at the bedside and discuss his neurological condition, prognosis and answered questions. Discussed with Dr. Elsworth Soho pulmonary critical care physician. Plan to extubate over the next 1-2 days as  tolerated versus trach.Discussed with daughter at bedside and answered questions. This patient is critically ill and at significant risk of neurological worsening, death and care requires constant monitoring of vital signs, hemodynamics,respiratory and cardiac monitoring, extensive review of multiple databases, frequent neurological assessment, discussion with family, other specialists and medical decision making of high complexity.I have made any additions or clarifications directly to the above note.This critical care time does not reflect procedure time, or teaching time or supervisory time of PA/NP/Med Resident etc but could involve care discussion time.  I spent 30 minutes of neurocritical care time  in the care of  this patient.     Antony Contras, MD Medical Director [facility] Stroke Center Pager: [phone] 12/31/2014 4:49 PM      To contact Stroke Continuity provider, please refer to [email or web address] After hours, contact General Neurology

## 2015-01-01 ENCOUNTER — Inpatient Hospital Stay (HOSPITAL_COMMUNITY): Payer: Medicare HMO

## 2015-01-01 DIAGNOSIS — J9621 Acute and chronic respiratory failure with hypoxia: Secondary | ICD-10-CM

## 2015-01-01 LAB — GLUCOSE, CAPILLARY
GLUCOSE-CAPILLARY: 143 mg/dL — AB (ref 65–99)
GLUCOSE-CAPILLARY: 157 mg/dL — AB (ref 65–99)
GLUCOSE-CAPILLARY: 160 mg/dL — AB (ref 65–99)
GLUCOSE-CAPILLARY: 163 mg/dL — AB (ref 65–99)
GLUCOSE-CAPILLARY: 168 mg/dL — AB (ref 65–99)
Glucose-Capillary: 145 mg/dL — ABNORMAL HIGH (ref 65–99)

## 2015-01-01 LAB — RENAL FUNCTION PANEL
ALBUMIN: 2.2 g/dL — AB (ref 3.5–5.0)
ANION GAP: 8 (ref 5–15)
BUN: 69 mg/dL — AB (ref 6–20)
CHLORIDE: 115 mmol/L — AB (ref 101–111)
CO2: 27 mmol/L (ref 22–32)
Calcium: 8.9 mg/dL (ref 8.9–10.3)
Creatinine, Ser: 2.1 mg/dL — ABNORMAL HIGH (ref 0.61–1.24)
GFR calc Af Amer: 32 mL/min — ABNORMAL LOW (ref >60)
GFR calc non Af Amer: 28 mL/min — ABNORMAL LOW (ref >60)
GLUCOSE: 183 mg/dL — AB (ref 65–99)
PHOSPHORUS: 4.5 mg/dL (ref 2.5–4.6)
POTASSIUM: 4 mmol/L (ref 3.5–5.1)
Sodium: 150 mmol/L — ABNORMAL HIGH (ref 135–145)

## 2015-01-01 LAB — CBC WITH DIFFERENTIAL/PLATELET
Basophils Absolute: 0.1 10*3/uL (ref 0.0–0.1)
Basophils Relative: 0 %
Eosinophils Absolute: 0.3 10*3/uL (ref 0.0–0.7)
Eosinophils Relative: 2 %
HEMATOCRIT: 34.8 % — AB (ref 39.0–52.0)
HEMOGLOBIN: 10.5 g/dL — AB (ref 13.0–17.0)
LYMPHS ABS: 1.1 10*3/uL (ref 0.7–4.0)
LYMPHS PCT: 7 %
MCH: 28.2 pg (ref 26.0–34.0)
MCHC: 30.2 g/dL (ref 30.0–36.0)
MCV: 93.3 fL (ref 78.0–100.0)
MONO ABS: 0.5 10*3/uL (ref 0.1–1.0)
MONOS PCT: 4 %
NEUTROS ABS: 13.7 10*3/uL — AB (ref 1.7–7.7)
NEUTROS PCT: 87 %
Platelets: 270 10*3/uL (ref 150–400)
RBC: 3.73 MIL/uL — ABNORMAL LOW (ref 4.22–5.81)
RDW: 16.7 % — AB (ref 11.5–15.5)
WBC: 15.7 10*3/uL — ABNORMAL HIGH (ref 4.0–10.5)

## 2015-01-01 LAB — PROTIME-INR
INR: 1.27 (ref 0.00–1.49)
PROTHROMBIN TIME: 16 s — AB (ref 11.6–15.2)

## 2015-01-01 LAB — MAGNESIUM: MAGNESIUM: 2.7 mg/dL — AB (ref 1.7–2.4)

## 2015-01-01 LAB — APTT: APTT: 44 s — AB (ref 24–37)

## 2015-01-01 MED ORDER — JEVITY 1.2 CAL PO LIQD
1000.0000 mL | ORAL | Status: DC
  Administered 2015-01-02: 1000 mL
  Administered 2015-01-04: 13:00:00
  Administered 2015-01-06 – 2015-01-15 (×9): 1000 mL
  Filled 2015-01-01 (×20): qty 1000

## 2015-01-01 MED ORDER — MIDAZOLAM HCL 2 MG/2ML IJ SOLN: 4.0000 mg | Freq: Once | INTRAMUSCULAR | Status: DC

## 2015-01-01 MED ORDER — ETOMIDATE 2 MG/ML IV SOLN: 40.0000 mg | Freq: Once | INTRAVENOUS | Status: DC

## 2015-01-01 MED ORDER — MIDAZOLAM HCL 2 MG/2ML IJ SOLN
4.0000 mg | Freq: Once | INTRAMUSCULAR | Status: AC
  Administered 2015-01-02: 4 mg via INTRAVENOUS
  Filled 2015-01-01: qty 4

## 2015-01-01 MED ORDER — ETOMIDATE 2 MG/ML IV SOLN
40.0000 mg | Freq: Once | INTRAVENOUS | Status: DC
  Filled 2015-01-01: qty 20

## 2015-01-01 MED ORDER — PRO-STAT SUGAR FREE PO LIQD
30.0000 mL | Freq: Three times a day (TID) | ORAL | Status: DC
  Administered 2015-01-01 – 2015-01-14 (×36): 30 mL
  Administered 2015-01-14: 09:00:00
  Administered 2015-01-15 (×2): 30 mL
  Filled 2015-01-01 (×41): qty 30

## 2015-01-01 MED ORDER — VECURONIUM BROMIDE 10 MG IV SOLR
10.0000 mg | Freq: Once | INTRAVENOUS | Status: DC
Start: 2015-01-01 — End: 2015-01-01

## 2015-01-01 MED ORDER — FENTANYL CITRATE (PF) 100 MCG/2ML IJ SOLN
200.0000 ug | Freq: Once | INTRAMUSCULAR | Status: AC
  Administered 2015-01-02: 200 ug via INTRAVENOUS
  Filled 2015-01-01: qty 4

## 2015-01-01 MED ORDER — FENTANYL CITRATE (PF) 100 MCG/2ML IJ SOLN: 200.0000 ug | Freq: Once | INTRAMUSCULAR | Status: DC

## 2015-01-01 MED ORDER — VECURONIUM BROMIDE 10 MG IV SOLR
10.0000 mg | Freq: Once | INTRAVENOUS | Status: AC
  Administered 2015-01-02: 10 mg via INTRAVENOUS

## 2015-01-01 MED ORDER — PROPOFOL 500 MG/50ML IV EMUL
5.0000 ug/kg/min | Freq: Once | INTRAVENOUS | Status: AC
  Administered 2015-01-02: 25 ug/kg/min via INTRAVENOUS
  Filled 2015-01-01: qty 50

## 2015-01-01 NOTE — Progress Notes (Signed)
STROKE TEAM PROGRESS NOTE   HISTORY Darius Barber is an 81 y.o. male transfered from [facility] after presenting with acute onset of right sided weakness and aphasia. Evaluated by specialists on call in ED (at [facility]) and given tPA for NIHSS of 20. Transferred to [facility] after tele-neurologist suggested IR evaluation. Per ED notes, patient was last seen normal at 9:00 PM on 12/20/14, ~45 minutes after wife noted abnormal breathing and reported "flexing movements of left arm".  Afterwards, patient was non-verbal, not moving his right side. ED notes mentioned a left gaze preference. CT head imaging reviewed, shows no acute process, signs of an old right MCA infarct. ED physician notes patient has history of fully blocked right ICA and 90% stenosis of L ICA.   Cerebral angiogram performed by Dr. Estanislado Pandy on 12/21/2014, no intervention, no acute findings.   Date last known well: 11/05 Time last known well: 2100 tPA Given: yes Modified Rankin: Rankin Score=0   SUBJECTIVE (INTERVAL HISTORY) Looks better this am Weaning this AM.  ,  .following commands well.. Daughter at bedside  OBJECTIVE Temp:  [97.1 F (36.2 C)-99.1 F (37.3 C)] 98.7 F (37.1 C) (11/17 1605) Pulse Rate:  [60-81] 67 (11/17 1600) Cardiac Rhythm:  [-] Normal sinus rhythm (11/17 1600) Resp:  [6-27] 23 (11/17 1600) BP: (111-188)/(51-94) 164/60 mmHg (11/17 1600) SpO2:  [91 %-99 %] 92 % (11/17 1600) FiO2 (%):  [40 %-50 %] 50 % (11/17 1524) Weight:  [208 lb 15.9 oz (94.8 kg)] 208 lb 15.9 oz (94.8 kg) (11/17 0500)  CBC:   Recent Labs Lab 12/31/14 0436 01/01/15 0323  WBC 14.1* 15.7*  NEUTROABS 12.5* 13.7*  HGB 10.8* 10.5*  HCT 36.7* 34.8*  MCV 92.4 93.3  PLT 259 [id number]    Basic Metabolic Panel:   Recent Labs Lab 12/31/14 0436 01/01/15 0323  NA 152* 150*  K 3.8 4.0  CL 117* 115*  CO2 27 27  GLUCOSE 197* 183*  BUN 59* 69*  CREATININE 2.13* 2.10*  CALCIUM 9.0 8.9  MG 2.5* 2.7*  PHOS 3.7 4.5    Lipid  Panel:     Component Value Date/Time   CHOL 129 12/21/2014 0410   TRIG 162* 12/28/2014 0230   HDL 27* 12/21/2014 0410   CHOLHDL 4.8 12/21/2014 0410   VLDL 23 12/21/2014 0410   LDLCALC 79 12/21/2014 0410   Urine Drug Screen:     Component Value Date/Time   LABOPIA NONE DETECTED 12/21/2014 0500   COCAINSCRNUR NONE DETECTED 12/21/2014 0500   LABBENZ POSITIVE* 12/21/2014 0500   AMPHETMU NONE DETECTED 12/21/2014 0500   THCU NONE DETECTED 12/21/2014 0500   LABBARB NONE DETECTED 12/21/2014 0500      IMAGING  Ct Abdomen Wo Contrast 12/21/2014   No evidence of significant hemorrhage within the chest, abdomen or pelvis. Confluent airspace consolidation in the posterior lower lobes. This could represent infectious infiltrate. Interstitial and alveolar edema is probably pulse present bilaterally.   Ct Head Wo Contrast 12/21/2014   No acute intracranial process.  Old RIGHT Chronic changes including old RIGHT MCA territory infarct and old LEFT basal ganglia lacunar infarcts. Findings of normal pressure hydrocephalus are similar.   Ct Chest Wo Contrast 12/21/2014   No evidence of significant hemorrhage within the chest, abdomen or pelvis. Confluent airspace consolidation in the posterior lower lobes. This could represent infectious infiltrate. Interstitial and alveolar edema is probably pulse present bilaterally.   Dg Chest Port 1 View 12/21/2014   Satisfactory ET tube position. No other  significant interval change.  CXR 1 view 12/24/14 Worsening bilateral airspace opacification may be due to edema. Pneumonia is not excluded.   MRI Brain 12/21/14 1. Left corona radiata 2.5 cm infarct without hemorrhage or mass effect. 2. No other acute intracranial abnormality. Chronic ischemic disease. Chronic right ICA occlusion. 3. Vertex scalp hematoma.  PHYSICAL EXAM  General: NAD. On ventilator. Follows most commands on the left.   HEENT: PERRL, EOMI. Moist mucus membranes. OETT/OGT in place.  Sclera injected Neck: Supple, no lymphadenopathy or carotid bruits Lungs: Air entry equal bilaterally, scattered coarse rhonchi; right worse than left. No wheezes.  Heart: RRR, no murmurs, gallops, or rubs Abdomen: Soft, mildly distended, BS + Extremities: No cyanosis, clulops, or rubs Abdomen: Soft, mildly distendbbing, or edema  Neurological Exam  Mental Status - Following most commands on the left. Patient has a significant delay in following commands. Cranial Nerves II - XII - II - PERRLA, blinks to threat III, IV, VI - Extraocular movements intact with some right-sided neglect. Blinks/closes eyes to command V/VII-  Corneals intact but decreased on the right  VIII - Hearing impaired X - Gag intact. XI - Unable to assess. XII - Unable to assess.  Motor Strength - Strength intact in LUE and LLE, RLE tone intact. RUE 0/5 strength, RLE 2/5 strength. Motor Tone - Muscle tone was assessed at the neck and appendages and was normal.   Sensory - Withdraws weakly to noxious stimuli  Coordination - Unable to assess  Gait and Station - Unable to assess   ASSESSMENT/PLAN Darius Barber is a 81 y.o. male with history of previous CVA, HLD, h/o Colon CA, HTN, CAD, right carotid artery occlusion, vascular disease, history of a left carotid stent, and renal insufficiency, presenting with aphasia and right-sided weakness. He received IV t-PA at [facility] for NIHSS of 20.    Stroke:  Dominant Left Corona Radiata ischemic infarct likely 2/2 small vessel disease  Resultant  Left hemiplegia, aphasia  MRI: Left corona radiata 2.5 cm infarct without hemorrhage or mass effect.  Catheter Angiogram: Angiographically occluded right internal carotid artery and the left  external carotid artery. Mild intrastent stenosis of the left internal carotid artery.  Carotid Doppler (10/24/2014): Known right internal carotid artery occlusion, patent left internal  carotid artery with stent 1-39%. Will  obtain report from catheter angiogram.  2D Echo: EF 55-60%. PAP 65 mm Hg  EEG: Moderate diffuse slowing   LDL - 79  HgbA1c 6.2  VTE prophylaxis - SCDs, Lovenox 40 mg SQ qd Diet NPO time specified Diet NPO time specified  aspirin 81 mg daily and clopidogrel 75 mg daily prior to admission, both restarted.   Ongoing aggressive stroke risk factor management  Therapy recommendations:  SNF  Disposition: Pending  Infectious Disease  Aspiration Pneumonia:  Management per PCCM.    Continue Unasyn  On chest therapy  CXR pending this AM;   Possible conjunctivitis  Continue antibiotic drops Hypernatremia  Most likely related to diuresis. Free water deficit may need to be judiciously addressed with increasing free water  Acute on CKD  Baseline Cr closer to 1.4, today 1.79  Continue to monitor  Hypothyroidism  Synthroid IV  Hypertension  Stable  Cardene prn gtt for SBP >180       Today, added Norvasc 5mg  daily  GI  Stable  Tolerating tube feeds  Hyperlipidemia  Home meds:  Lipitor 80 mg daily not resumed in hospital. NPO  LDL 79, goal < 70  Add Lipitor 80 mg daily when taking PO  Continue statin at discharge  Other Stroke Risk Factors  Advanced age  Cigarette smoker, quit smoking 35 years ago   Hx stroke/TIA  Coronary artery disease    I have personally examined this patient, reviewed notes, independently viewed imaging studies, participated in medical decision making and plan of care. I have made any additions or clarifications directly to the above note. Agree with note above. Patient will likely need inpatient rehabilitation(sniff rehabilitation after extubation. I had a long discussion with the patient's wife at the bedside and discuss his neurological condition, prognosis and answered questions. Discussed with Dr. Elsworth Soho pulmonary critical care physician. Plan for tracheostomy tomorrow followed by weaning over the weekend.Discussed with  daughter at bedside and answered questions. This patient is critically ill and at significant risk of neurological worsening, death and care requires constant monitoring of vital signs, hemodynamics,respiratory and cardiac monitoring, extensive review of multiple databases, frequent neurological assessment, discussion with family, other specialists and medical decision making of high complexity.I have made any additions or clarifications directly to the above note.This critical care time does not reflect procedure time, or teaching time or supervisory time of PA/NP/Med Resident etc but could involve care discussion time.  I spent 32 minutes of neurocritical care time  in the care of  this patient.   Antony Contras, MD Medical Director [facility] Stroke Center Pager: [phone] 01/01/2015 6:51 PM      To contact Stroke Continuity provider, please refer to [email or web address] After hours, contact General Neurology

## 2015-01-01 NOTE — Progress Notes (Signed)
PULMONARY  / [place]   Name: Darius Barber MRN: [id number] DOB: [birth year]    ADMISSION DATE:  12/21/2014 CONSULTATION DATE: 12/21/2014  REQUESTING CLINICIAN: Dr. Leonie Man PRIMARY SERVICE: Neurology  CHIEF COMPLAINT:  Stroke  BRIEF PATIENT DESCRIPTION: 81 M transferred from [facility] after presenting with acute onset of right sided weakness and aphasia. Given tPA . He went to VIR on arrival with a normal angiogram. GCS 15 on arrival to NSICU but developed AMS with GCS of 7. PCCM consulted for intubation and vent management. prior CVA, CAD, carotid stenosis Course complicated by aspiration pneumonia & prolonged ventilation   SIGNIFICANT EVENTS: 11/6 - Admitted to hospital 11/14 weans on PS 12/5  STUDIES:   CT Head 11/7 - Left corona radiata infarct. No hemorrhage. Old infarcts.  TTE 11/7 - LV normal in size. Mild LVH. EF 55-60%. LA normal in size. RA normal in size. RV normal in size and function. No pericardial effusion.   LINES / TUBES: ETT 11/6 >>  CULTURES: Tracheal Aspirate (11/6):  Oral Flora  ANTIBIOTICS: Unasyn 11/6>>11/16   SUBJECTIVE:  afebrile Bloody secretions Weans on PS 10/5 but abdominal breathing on lower   VITAL SIGNS: Temp:  [97.1 F (36.2 C)-99.1 F (37.3 C)] 98.7 F (37.1 C) (11/17 0736) Pulse Rate:  [60-81] 81 (11/17 0808) Resp:  [6-27] 21 (11/17 0808) BP: (111-188)/(52-94) 188/82 mmHg (11/17 0808) SpO2:  [91 %-99 %] 93 % (11/17 0808) FiO2 (%):  [40 %-50 %] 40 % (11/17 0808) Weight:  [208 lb 15.9 oz (94.8 kg)] 208 lb 15.9 oz (94.8 kg) (11/17 0500) HEMODYNAMICS:   VENTILATOR SETTINGS: Vent Mode:  [-] PSV;CPAP FiO2 (%):  [40 %-50 %] 40 % Set Rate:  [15 bmp] 15 bmp Vt Set:  [600 mL] 600 mL PEEP:  [5 cmH20] 5 cmH20 Pressure Support:  [10 cmH20-15 cmH20] 10 cmH20 Plateau Pressure:  [28 cmH20-35 cmH20] 32 cmH20   INTAKE / OUTPUT: Intake/Output      11/16 0701 - 11/17 0700 11/17 0701 - 11/18 0700   I.V.  (mL/kg) 480 (5.1) 20 (0.2)   NG/GT 1560 65   IV Piggyback     Total Intake(mL/kg) 2040 (21.5) 85 (0.9)   Urine (mL/kg/hr) 1475 (0.6) 300 (1.6)   Stool     Total Output 1475 300   Net +565 -215         PHYSICAL EXAMINATION: General:  Intubated. No distress.  Integument:  Warm & dry. No rash on exposed skin.  HEENT:  No scleral injection. Endotracheal tube in place.  Cardiovascular:  Regular rate & rhythm. Unable to appreciate JVD given body habitus.  Pulmonary:  Coarse breath sounds bilaterally persist. Thick, tan secretions unchanged. Symmetric chest wall rise  Abdomen: Soft. Normal bowel sounds. Protuberant. Neurological: spontaneously moves left upper extremity. Babinski persistent right lower extremity.  Follows 1 step commands  LABS:  CBC  Recent Labs Lab 12/30/14 0333 12/31/14 0436 01/01/15 0323  WBC 13.0* 14.1* 15.7*  HGB 10.6* 10.8* 10.5*  HCT 35.2* 36.7* 34.8*  PLT 223 259 270   Coag's No results for input(s): APTT, INR in the last 168 hours. BMET  Recent Labs Lab 12/30/14 0333 12/31/14 0436 01/01/15 0323  NA 152* 152* 150*  K 3.4* 3.8 4.0  CL 118* 117* 115*  CO2 27 27 27   BUN 47* 59* 69*  CREATININE 1.65* 2.13* 2.10*  GLUCOSE 183* 197* 183*   Electrolytes  Recent Labs Lab 12/30/14 0333 12/31/14 0436 01/01/15 0323  CALCIUM  8.9 9.0 8.9  MG 2.4 2.5* 2.7*  PHOS 2.9 3.7 4.5   Sepsis Markers No results for input(s): LATICACIDVEN, PROCALCITON, O2SATVEN in the last 168 hours. ABG  Recent Labs Lab 12/28/14 0402 12/29/14 0432  PHART 7.442 7.422  PCO2ART 40.6 41.7  PO2ART 74.1* 64.3*   Liver Enzymes  Recent Labs Lab 12/30/14 0333 12/31/14 0436 01/01/15 0323  ALBUMIN 2.2* 2.1* 2.2*   Cardiac Enzymes No results for input(s): TROPONINI, PROBNP in the last 168 hours. Glucose  Recent Labs Lab 12/31/14 1124 12/31/14 1633 12/31/14 1946 01/01/15 01/01/15 0349 01/01/15 0731  GLUCAP 156* 143* 151* 160* 168* 145*   Imaging Dg Chest  Port 1 View  01/01/2015  CLINICAL DATA:  Acute respiratory failure with hypoxemia. EXAM: PORTABLE CHEST 1 VIEW COMPARISON:  12/31/2014 FINDINGS: Endotracheal tube is in place with tip 6.8 cm above carina. Nasogastric tube is in place, tip beyond the film beyond the gastroesophageal junction. Heart is enlarged. There patchy infiltrates throughout the lungs bilaterally, unchanged. IMPRESSION: Stable appearance of endotracheal tube and nasogastric tube. Persistent bilateral pulmonary infiltrates. Electronically Signed   By: Nolon Nations M.D.   On: 01/01/2015 08:47   Dg Chest Port 1 View  12/31/2014  CLINICAL DATA:  81 year old male with acute respiratory failure, hypoxia. Initial encounter. EXAM: PORTABLE CHEST 1 VIEW COMPARISON:  12/30/2014 and earlier. FINDINGS: Portable AP semi upright view at 0853 hours. Extubated. Enteric tube remains in place. Interval lower lung volumes and increased confluent bilateral pulmonary opacity worse on the left. No large pleural effusion. No pneumothorax. Stable cardiac size and mediastinal contours. Sequelae of CABG and probable left thyroid resection. IMPRESSION: 1. Extubated with lower lung volumes and increased confluent bilateral pulmonary opacity which could be related to bilateral pneumonia, edema, ARDS. 2. Enteric tube remains. Electronically Signed   By: Genevie Ann M.D.   On: 12/31/2014 09:07        ASSESSMENT / PLAN: 81 year old male with left corona radiata infarct and altered mentation requiring intubation for airway protection. Likely  aspiration pneumonia.   1. Left corona radiata CVA: Management per neurology. Aspirin, Plavix, & Lipitor. 2. Acute hypoxic respiratory failure: Likely secondary  to aspiration pneumonia. -  Ct SBT attempts but planning for trach on 11/18 .Albuterol nebs q6hr, guaifenesin via tube every 6 hours, & chest PT via bed every 4 hours  to help with secretion clearance.    Aspiration pneumonia: stopped  Unasyn  x10 ds. Tracheal  aspirate culture showed only oral flora consistent with aspiration. Rpt culture to see if abx need to be extended 3. H/O CAD S/P CABG/ICM:  Appreciate Cardiology recommendations. Likely stress/demand ischemia. Echocardiogram -no  wall motion abnormality. On Plavix, Lipitor, & ASA.  Hypertension: resumed aten, ct amlodipin, Hydralazine PRN  for now.  Hyperlipidemia: Lipitor 80mg  via tube qhs. 4. Acute renal failure: improving  with good UOP. Continuing to trend renal function with daily BUN/creatinine. Hold losartan 5. Hypernatremia:allow mild, follow  6. Hypokalemia: Replace prn  7. Anemia: Mild. No signs of active bleeding. Continuing to trend hemoglobin daily with CBC. 8. Thrombocytopenia: Resolved.  9. Hyperglycemia: Good blood glucose control. Hemoglobin A1c 6.2. Accu-Cheks every 4 hours with low-dose sliding scale coverage. 10. Hypomagnesemia:  Resolved.  11. History of Hypothyroidism:  Continuing levothyroxine 150 g via tube daily. 12. Diet: Continuing tube feeds per dietary recommendations.    Prophylaxis: SCDs, Protonix IV daily, & heparin subcutaneous every 8 hours given his acute renal failure.   Summary -He has not made significant progress  for extubation , proceed with  Trach 11/18, defer peg until swallow eval next week -  d/w wife & daughter 11/15   The patient is critically ill with multiple organ systems failure and requires high complexity decision making for assessment and support, frequent evaluation and titration of therapies, application of advanced monitoring technologies and extensive interpretation of multiple databases. Critical Care Time devoted to patient care services described in this note independent of APP time is 31 minutes.   Kara Mead MD. [place].  Pulmonary & Critical care Pager [phone] If no response call [phone]    01/01/2015, 9:02 AM

## 2015-01-01 NOTE — Progress Notes (Signed)
Occupational Therapy Treatment Patient Details Name: Darius Barber MRN: [id number] DOB: 05-26-1931 Today's Date: 01/01/2015    History of present illness pt presents with L Corona Radiata Infarct with concern for aspiration causing Hypoxic Respiratory Failure, which required intubation on 11/6.  pt with hx of CAD, CABG, CVA, and Colon CA.   OT comments  Focus of session on PROM, edema management and positioning R UE with education provided to family. Daughter to perform 3x a day. Family also encouraging pt regard R visual field/R side when interacting with pt.  Plan is for trach.  Follow Up Recommendations  LTACH    Equipment Recommendations       Recommendations for Other Services      Precautions / Restrictions Precautions Precautions: Fall (vent) Precaution Comments: OETT Restrictions Weight Bearing Restrictions: No       Mobility Bed Mobility                  Transfers                      Balance                                   ADL                                                Vision                     Perception     Praxis      Cognition   Behavior During Therapy: Flat affect Overall Cognitive Status: Difficult to assess                       Extremity/Trunk Assessment               Exercises Other Exercises Other Exercises: Daughter performing retrograde massage as OT entered room. PROM all areas R UE performed with removal of BP cuff for maximum benefit of edema management techniques, replaced at end of session.  Positioned R UE in elevation with hand in functional position using washcloths. Other Exercises: Facilitated pt attending to R side and educated family in same.   Shoulder Instructions       General Comments      Pertinent Vitals/ Pain       Pain Assessment: Faces Faces Pain Scale: No hurt  Home Living                                           Prior Functioning/Environment              Frequency Min 2X/week     Progress Toward Goals  OT Goals(current goals can now be found in the care plan section)  Progress towards OT goals: Progressing toward goals  Acute Rehab OT Goals Patient Stated Goal: wife would like to hear pt talk  Plan Discharge plan needs to be updated    Co-evaluation                 End of Session Equipment Utilized During Treatment: Other (comment) (on vent)   Activity Tolerance  Patient tolerated treatment well   Patient Left in bed;with family/visitor present   Nurse Communication          Time: [phone] OT Time Calculation (min): 19 min  Charges: OT General Charges $OT Visit: 1 Procedure OT Treatments $Neuromuscular Re-education: 8-22 mins  [name] 01/01/2015, 9:56 AM  [phone]

## 2015-01-01 NOTE — Progress Notes (Signed)
Per discussion with Dr. Elsworth Soho, pt should be ready for dc to [facility] on Monday, 01/05/15.  Family agreeable with this plan.   Notified Bryan, admissions liaison with [facility].  Gaspar Bidding met with family, and plans to start insurance authorization in preparation for likely admission.   Admission paperwork initiated.  Will follow progress over the weekend in hopes for admission to [facility] facility on Monday.    Reinaldo Raddle, RN, BSN  Trauma/Neuro ICU Case Manager [phone]

## 2015-01-01 NOTE — Progress Notes (Signed)
Nutrition Follow-up  INTERVENTION:   Decrease Jevity 1.2 to 55 ml/hr Increase Prostat to TID  Provides: 1884 kcal (101% of needs), 118 grams protein, and 1069 ml H2O.  Total free water: 2069 ml  NUTRITION DIAGNOSIS:   Inadequate oral intake related to inability to eat as evidenced by NPO status. Ongoing.   GOAL:   Patient will meet greater than or equal to 90% of their needs Met  MONITOR:   I & O's, Vent status, Labs, Weight trends, TF tolerance  ASSESSMENT:   81 yo male from [place] with altered mental status, Rt sided weakness, aphasia with concern for CVA. S/P tPA and neuro IR. Acute respiratory failure 2nd to aspiration PNA and compromised airway.  Patient is currently intubated on ventilator support MV: 8.9 L/min Temp (24hrs), Avg:98.3 F (36.8 C), Min:97.1 F (36.2 C), Max:99.1 F (37.3 C)  Plan for trach 11/18, defer PEG for now. Check swallow next week before decision on PEG placement.  250 ml H2O every 6 hours Labs reviewed: sodium elevated (150), Magnesium elevated (2.2) CBG's: 145-168   Diet Order:  Diet NPO time specified Diet NPO time specified  Skin:  Reviewed, no issues  Last BM:  11/14 via rectal pouch  Height:   Ht Readings from Last 1 Encounters:  12/21/14 _0  (1.753 m)   Weight:   Wt Readings from Last 1 Encounters:  01/01/15 208 lb 15.9 oz (94.8 kg)   Ideal Body Weight:  72.7 kg  BMI:  Body mass index is 30.85 kg/(m^2).  Estimated Nutritional Needs:   Kcal:  2068  Protein:  110-120 grams  Fluid:  > 1.9 L/day  EDUCATION NEEDS:   No education needs identified at this time  [place], [place], [place] Pager [phone] After Hours Pager

## 2015-01-02 ENCOUNTER — Inpatient Hospital Stay (HOSPITAL_COMMUNITY): Payer: Medicare HMO

## 2015-01-02 DIAGNOSIS — J9601 Acute respiratory failure with hypoxia: Secondary | ICD-10-CM | POA: Insufficient documentation

## 2015-01-02 LAB — GLUCOSE, CAPILLARY
GLUCOSE-CAPILLARY: 127 mg/dL — AB (ref 65–99)
GLUCOSE-CAPILLARY: 132 mg/dL — AB (ref 65–99)
GLUCOSE-CAPILLARY: 128 mg/dL — AB (ref 65–99)
GLUCOSE-CAPILLARY: 92 mg/dL (ref 65–99)
GLUCOSE-CAPILLARY: 99 mg/dL (ref 65–99)
Glucose-Capillary: 147 mg/dL — ABNORMAL HIGH (ref 65–99)
Glucose-Capillary: 155 mg/dL — ABNORMAL HIGH (ref 65–99)

## 2015-01-02 LAB — CBC WITH DIFFERENTIAL/PLATELET
BASOS ABS: 0.1 10*3/uL (ref 0.0–0.1)
Basophils Relative: 0 %
EOS PCT: 2 %
Eosinophils Absolute: 0.3 10*3/uL (ref 0.0–0.7)
HEMATOCRIT: 34 % — AB (ref 39.0–52.0)
Hemoglobin: 10.2 g/dL — ABNORMAL LOW (ref 13.0–17.0)
LYMPHS ABS: 1.2 10*3/uL (ref 0.7–4.0)
LYMPHS PCT: 8 %
MCH: 27.9 pg (ref 26.0–34.0)
MCHC: 30 g/dL (ref 30.0–36.0)
MCV: 92.9 fL (ref 78.0–100.0)
MONO ABS: 0.6 10*3/uL (ref 0.1–1.0)
Monocytes Relative: 4 %
NEUTROS ABS: 13.2 10*3/uL — AB (ref 1.7–7.7)
Neutrophils Relative %: 86 %
PLATELETS: 314 10*3/uL (ref 150–400)
RBC: 3.66 MIL/uL — AB (ref 4.22–5.81)
RDW: 17.1 % — ABNORMAL HIGH (ref 11.5–15.5)
WBC: 15.3 10*3/uL — ABNORMAL HIGH (ref 4.0–10.5)

## 2015-01-02 LAB — MAGNESIUM: Magnesium: 2.9 mg/dL — ABNORMAL HIGH (ref 1.7–2.4)

## 2015-01-02 LAB — BASIC METABOLIC PANEL
Anion gap: 5 (ref 5–15)
BUN: 71 mg/dL — AB (ref 6–20)
CALCIUM: 8.9 mg/dL (ref 8.9–10.3)
CO2: 28 mmol/L (ref 22–32)
CREATININE: 2 mg/dL — AB (ref 0.61–1.24)
Chloride: 119 mmol/L — ABNORMAL HIGH (ref 101–111)
GFR, EST AFRICAN AMERICAN: 34 mL/min — AB (ref >60)
GFR, EST NON AFRICAN AMERICAN: 30 mL/min — AB (ref >60)
Glucose, Bld: 140 mg/dL — ABNORMAL HIGH (ref 65–99)
Potassium: 3.8 mmol/L (ref 3.5–5.1)
SODIUM: 152 mmol/L — AB (ref 135–145)

## 2015-01-02 MED ORDER — SODIUM CHLORIDE 0.9 % IV BOLUS (SEPSIS): 750.0000 mL | Freq: Once | INTRAVENOUS | Status: AC

## 2015-01-02 NOTE — Progress Notes (Signed)
Physical Therapy Treatment Patient Details Name: YAKOV LAUGHTON MRN: [id number] DOB: 1931-12-22 Today's Date: 01/02/2015    History of Present Illness pt presents with L Corona Radiata Infarct with concern for aspiration causing Hypoxic Respiratory Failure, which required intubation on 11/6.  pt with hx of CAD, CABG, CVA, and Colon CA.    PT Comments    Pt seems fatigued today and per RN pt had been up all night.  In bed performed ROM in LEs with pt actively participating in L knee flexion x2 reps before fatiguing vs decreased attention.  Plan for Trach to be placed today.  Will continue to follow.    Follow Up Recommendations  LTACH     Equipment Recommendations  None recommended by PT    Recommendations for Other Services       Precautions / Restrictions Precautions Precautions: Fall Precaution Comments: OETT Restrictions Weight Bearing Restrictions: No    Mobility  Bed Mobility                  Transfers                    Ambulation/Gait                 Stairs            Wheelchair Mobility    Modified Rankin (Stroke Patients Only)       Balance                                    Cognition Arousal/Alertness: Awake/alert Behavior During Therapy: Flat affect Overall Cognitive Status: Difficult to assess                      Exercises General Exercises - Lower Extremity Ankle Circles/Pumps: PROM;Both;5 reps Long Arc Quad: PROM;AAROM;Both;5 reps (pt did A with 2 on L LE) Hip ABduction/ADduction: PROM;Both;5 reps    General Comments General comments (skin integrity, edema, etc.): Attempted to assess vision, however difficult due to pt difficulty tracking and focusing on objects.  pt maintains gaze to L side and will only come as far as midline with gaze.        Pertinent Vitals/Pain Pain Assessment: Faces Faces Pain Scale: No hurt    Home Living                      Prior Function             PT Goals (current goals can now be found in the care plan section) Acute Rehab PT Goals Patient Stated Goal: wife would like to hear pt talk PT Goal Formulation: Patient unable to participate in goal setting Time For Goal Achievement: 01/06/15 Potential to Achieve Goals: Good Progress towards PT goals: Progressing toward goals    Frequency  Min 2X/week    PT Plan Discharge plan needs to be updated;Frequency needs to be updated    Co-evaluation             End of Session Equipment Utilized During Treatment:  (Vent) Activity Tolerance: Patient limited by fatigue Patient left: in bed;with call bell/phone within reach;with family/visitor present     Time: 1056-1110 PT Time Calculation (min) (ACUTE ONLY): 14 min  Charges:  $Therapeutic Exercise: 8-22 mins  G CodesCatarina Hartshorn, [place] 01/02/2015, 11:26 AM

## 2015-01-02 NOTE — Procedures (Signed)
Bronchoscopy Procedure Note Darius Barber [id number] [birth year]  Procedure: Bronchoscopy Indications: tracheostomy placement  Procedure Details Consent: Risks of procedure as well as the alternatives and risks of each were explained to the (patient/caregiver).  Consent for procedure obtained. Time Out: Verified patient identification, verified procedure, site/side was marked, verified correct patient position, special equipment/implants available, medications/allergies/relevent history reviewed, required imaging and test results available.  Performed  In preparation for procedure, patient was given 100% FiO2. Sedation: Benzodiazepines and Etomidate  Airway entered and the following bronchi were examined: RUL, RML, RLL, LUL and LLL.   Procedures performed: Brushings performed   At first bronch was introduce through ET tube and structures of tracheal rings, carina identified for operator of tracheostomy who was Dr Titus Mould. Light of bronch passed through trachea and skin for indentification of tracheal rings for tracheostomy puncture. After this, under bronchoscopy guidance,  ET tube was pulled back sufficiently and very carefully. The ET tube was  pulled back enough to give room for tracheostomy operator and yet at same time to to ensure a secured airway. After this was accomplished, bronchoscope was withdrawn into the ET tube. After this,  Dr Titus Mould then performed tracheostomy under video visual provided by flexible video bronchoscopy. Followng introduction of tracheostomy,  the bronchoscope was removed from ET tube and introduced through tracheostomy. Correct position of tracheostomy was ensured, with enough room between carina and distal tracheostomy and no evidence of bleeding. The bronchoscope was then withdrawn. Respiratory therapist was then instructed to remove the ET tube.  Dr Titus Mould then proceeded to complete the tracheostomy with stay sutures   No complications   Bronchoscope  removed.    Evaluation Hemodynamic Status: BP stable throughout; O2 sats: transiently fell during during procedure Patient's Current Condition: stable Specimens:  Sent purulent fluid Complications: No apparent complications Patient did tolerate procedure well.   [name] 01/02/2015

## 2015-01-02 NOTE — Progress Notes (Signed)
STROKE TEAM PROGRESS NOTE   HISTORY Darius Barber is an 81 y.o. male transfered from [facility] after presenting with acute onset of right sided weakness and aphasia. Evaluated by specialists on call in ED (at [facility]) and given tPA for NIHSS of 20. Transferred to [facility] after tele-neurologist suggested IR evaluation. Per ED notes, patient was last seen normal at 9:00 PM on 12/20/14, ~45 minutes after wife noted abnormal breathing and reported "flexing movements of left arm".  Afterwards, patient was non-verbal, not moving his right side. ED notes mentioned a left gaze preference. CT head imaging reviewed, shows no acute process, signs of an old right MCA infarct. ED physician notes patient has history of fully blocked right ICA and 90% stenosis of L ICA.   Cerebral angiogram performed by Dr. Estanislado Pandy on 12/21/2014, no intervention, no acute findings.   Date last known well: 11/05 Time last known well: 2100 tPA Given: yes Modified Rankin: Rankin Score=0   SUBJECTIVE (INTERVAL HISTORY) Looks better this am Weaning this AM.  ,  .following commands well.. Plan for tracheostomy  this afternoon OBJECTIVE Temp:  [96.5 F (35.8 C)-99.3 F (37.4 C)] 96.5 F (35.8 C) (11/18 0800) Pulse Rate:  [61-73] 65 (11/18 1125) Cardiac Rhythm:  [-] Normal sinus rhythm (11/18 0800) Resp:  [16-26] 20 (11/18 1125) BP: (118-180)/(51-77) 172/61 mmHg (11/18 1125) SpO2:  [91 %-96 %] 93 % (11/18 1125) FiO2 (%):  [40 %-50 %] 50 % (11/18 1125)  CBC:   Recent Labs Lab 01/01/15 0323 01/02/15 0451  WBC 15.7* 15.3*  NEUTROABS 13.7* 13.2*  HGB 10.5* 10.2*  HCT 34.8* 34.0*  MCV 93.3 92.9  PLT 270 [id number]    Basic Metabolic Panel:   Recent Labs Lab 12/31/14 0436 01/01/15 0323 01/02/15 0451  NA 152* 150*  --   K 3.8 4.0  --   CL 117* 115*  --   CO2 27 27  --   GLUCOSE 197* 183*  --   BUN 59* 69*  --   CREATININE 2.13* 2.10*  --   CALCIUM 9.0 8.9  --   MG 2.5* 2.7* 2.9*  PHOS 3.7 4.5  --      Lipid Panel:     Component Value Date/Time   CHOL 129 12/21/2014 0410   TRIG 162* 12/28/2014 0230   HDL 27* 12/21/2014 0410   CHOLHDL 4.8 12/21/2014 0410   VLDL 23 12/21/2014 0410   LDLCALC 79 12/21/2014 0410   Urine Drug Screen:     Component Value Date/Time   LABOPIA NONE DETECTED 12/21/2014 0500   COCAINSCRNUR NONE DETECTED 12/21/2014 0500   LABBENZ POSITIVE* 12/21/2014 0500   AMPHETMU NONE DETECTED 12/21/2014 0500   THCU NONE DETECTED 12/21/2014 0500   LABBARB NONE DETECTED 12/21/2014 0500      IMAGING  Ct Abdomen Wo Contrast 12/21/2014   No evidence of significant hemorrhage within the chest, abdomen or pelvis. Confluent airspace consolidation in the posterior lower lobes. This could represent infectious infiltrate. Interstitial and alveolar edema is probably pulse present bilaterally.   Ct Head Wo Contrast 12/21/2014   No acute intracranial process.  Old RIGHT Chronic changes including old RIGHT MCA territory infarct and old LEFT basal ganglia lacunar infarcts. Findings of normal pressure hydrocephalus are similar.   Ct Chest Wo Contrast 12/21/2014   No evidence of significant hemorrhage within the chest, abdomen or pelvis. Confluent airspace consolidation in the posterior lower lobes. This could represent infectious infiltrate. Interstitial and alveolar edema is probably pulse present bilaterally.  Dg Chest Port 1 View 12/21/2014   Satisfactory ET tube position. No other significant interval change.  CXR 1 view 12/24/14 Worsening bilateral airspace opacification may be due to edema. Pneumonia is not excluded.   MRI Brain 12/21/14 1. Left corona radiata 2.5 cm infarct without hemorrhage or mass effect. 2. No other acute intracranial abnormality. Chronic ischemic disease. Chronic right ICA occlusion. 3. Vertex scalp hematoma.  PHYSICAL EXAM  General: NAD. On ventilator. Follows most commands on the left.   HEENT: PERRL, EOMI. Moist mucus membranes.  OETT/OGT in place. Sclera injected Neck: Supple, no lymphadenopathy or carotid bruits Lungs: Air entry equal bilaterally, scattered coarse rhonchi; right worse than left. No wheezes.  Heart: RRR, no murmurs, gallops, or rubs Abdomen: Soft, mildly distended, BS + Extremities: No cyanosis, clulops, or rubs Abdomen: Soft, mildly distendbbing, or edema  Neurological Exam  Mental Status - Following most commands on the left. Patient has a significant delay in following commands. Cranial Nerves II - XII - II - PERRLA, blinks to threat III, IV, VI - Extraocular movements intact with some right-sided neglect. Blinks/closes eyes to command V/VII-  Corneals intact but decreased on the right  VIII - Hearing impaired X - Gag intact. XI - Unable to assess. XII - Unable to assess.  Motor Strength - Strength intact in LUE and LLE, RLE tone intact. RUE 0/5 strength, RLE 2/5 strength. Motor Tone - Muscle tone was assessed at the neck and appendages and was normal.   Sensory - Withdraws weakly to noxious stimuli  Coordination - Unable to assess  Gait and Station - Unable to assess   ASSESSMENT/PLAN Darius Barber is a 81 y.o. male with history of previous CVA, HLD, h/o Colon CA, HTN, CAD, right carotid artery occlusion, vascular disease, history of a left carotid stent, and renal insufficiency, presenting with aphasia and right-sided weakness. He received IV t-PA at [facility] for NIHSS of 20.    Stroke:  Dominant Left Corona Radiata ischemic infarct likely 2/2 small vessel disease  Resultant  Left hemiplegia, aphasia  MRI: Left corona radiata 2.5 cm infarct without hemorrhage or mass effect.  Catheter Angiogram: Angiographically occluded right internal carotid artery and the left  external carotid artery. Mild intrastent stenosis of the left internal carotid artery.  Carotid Doppler (10/24/2014): Known right internal carotid artery occlusion, patent left internal  carotid artery with  stent 1-39%. Will obtain report from catheter angiogram.  2D Echo: EF 55-60%. PAP 65 mm Hg  EEG: Moderate diffuse slowing   LDL - 79  HgbA1c 6.2  VTE prophylaxis - SCDs, Lovenox 40 mg SQ qd Diet NPO time specified  aspirin 81 mg daily and clopidogrel 75 mg daily prior to admission, both restarted.   Ongoing aggressive stroke risk factor management  Therapy recommendations:  SNF  Disposition: Pending  Infectious Disease  Aspiration Pneumonia:  Management per PCCM.    Continue Unasyn  On chest therapy  CXR pending this AM;   Possible conjunctivitis  Continue antibiotic drops Hypernatremia  Most likely related to diuresis. Free water deficit may need to be judiciously addressed with increasing free water  Acute on CKD  Baseline Cr closer to 1.4, today 1.79  Continue to monitor  Hypothyroidism  Synthroid IV  Hypertension  Stable  Cardene prn gtt for SBP >180       Today, added Norvasc 5mg  daily  GI  Stable  Tolerating tube feeds  Hyperlipidemia  Home meds:  Lipitor 80 mg daily not resumed  in hospital. NPO  LDL 79, goal < 70  Add Lipitor 80 mg daily when taking PO  Continue statin at discharge  Other Stroke Risk Factors  Advanced age  Cigarette smoker, quit smoking 35 years ago   Hx stroke/TIA  Coronary artery disease    I have personally examined this patient, reviewed notes, independently viewed imaging studies, participated in medical decision making and plan of care. I have made any additions or clarifications directly to the above note. Agree with note above. Patient will likely need inpatient rehabilitation(sniff rehabilitation after extubation. I had a long discussion with the patient's wife at the bedside and discuss his neurological condition, prognosis and answered questions. Discussed with Dr. Elsworth Soho pulmonary critical care physician. Plan for tracheostomy today followed by weaning over the weekend.Discussed with daughter at  bedside 01/01/15 and answered questions. This patient is critically ill and at significant risk of neurological worsening, death and care requires constant monitoring of vital signs, hemodynamics,respiratory and cardiac monitoring, extensive review of multiple databases, frequent neurological assessment, discussion with family, other specialists and medical decision making of high complexity.I have made any additions or clarifications directly to the above note.This critical care time does not reflect procedure time, or teaching time or supervisory time of PA/NP/Med Resident etc but could involve care discussion time.  I spent 30 minutes of neurocritical care time  in the care of  this patient.   Antony Contras, MD Medical Director [facility] Pager: [phone] 01/02/2015 11:27 AM      To contact Stroke Continuity provider, please refer to [email or web address] After hours, contact General Neurology

## 2015-01-02 NOTE — Progress Notes (Addendum)
PULMONARY  / [place]   Name: Darius Barber MRN: [id number] DOB: 09-06-1931    ADMISSION DATE:  12/21/2014 CONSULTATION DATE: 12/21/2014  REQUESTING CLINICIAN: Dr. Leonie Man PRIMARY SERVICE: Neurology  CHIEF COMPLAINT:  Stroke  BRIEF PATIENT DESCRIPTION: 81 M transferred from [facility] after presenting with acute onset of right sided weakness and aphasia. Given tPA . He went to VIR on arrival with a normal angiogram. GCS 15 on arrival to NSICU but developed AMS with GCS of 7. PCCM consulted for intubation and vent management. prior CVA, CAD, carotid stenosis Course complicated by aspiration pneumonia & prolonged ventilation   SIGNIFICANT EVENTS: 11/6 - Admitted to hospital 11/14 weans on PS 12/5  STUDIES:   CT Head 11/7 - Left corona radiata infarct. No hemorrhage. Old infarcts.  TTE 11/7 - LV normal in size. Mild LVH. EF 55-60%. LA normal in size. RA normal in size. RV normal in size and function. No pericardial effusion.   LINES / TUBES: ETT 11/6 >>  CULTURES: Tracheal Aspirate (11/6):  Oral Flora 11/17 resp >>   ANTIBIOTICS: Unasyn 11/6>>11/16   SUBJECTIVE:  afebrile Bloody secretions decreasing Weans on PS 10/5 but abdominal breathing on lower  Good UO  VITAL SIGNS: Temp:  [98.4 F (36.9 C)-99.3 F (37.4 C)] 98.4 F (36.9 C) (11/18 0400) Pulse Rate:  [61-73] 61 (11/18 0800) Resp:  [16-26] 16 (11/18 0800) BP: (118-180)/(51-77) 152/56 mmHg (11/18 0800) SpO2:  [91 %-96 %] 93 % (11/18 0800) FiO2 (%):  [40 %-50 %] 50 % (11/18 0800) HEMODYNAMICS:   VENTILATOR SETTINGS: Vent Mode:  [-] PRVC FiO2 (%):  [40 %-50 %] 50 % Set Rate:  [15 bmp] 15 bmp Vt Set:  [600 mL] 600 mL PEEP:  [5 cmH20] 5 cmH20 Plateau Pressure:  [19 cmH20-36 cmH20] 31 cmH20   INTAKE / OUTPUT: Intake/Output      11/17 0701 - 11/18 0700 11/18 0701 - 11/19 0700   I.V. (mL/kg) 440 (4.6)    NG/GT 1139.2    Total Intake(mL/kg) 1579.2 (16.7)    Urine  (mL/kg/hr) 1260 (0.6)    Stool 0 (0)    Total Output 1260     Net +319.2          Urine Occurrence  1 x   Stool Occurrence 1 x     PHYSICAL EXAMINATION: General:  Intubated. No distress.  Integument:  Warm & dry. No rash on exposed skin.  HEENT:  No scleral injection. Endotracheal tube in place.  Cardiovascular:  Regular rate & rhythm. Unable to appreciate JVD given body habitus.  Pulmonary:  Coarse breath sounds bilaterally persist. Thick, tan secretions unchanged. Symmetric chest wall rise  Abdomen: Soft. Normal bowel sounds. Protuberant. Neurological: spontaneously moves left upper extremity. Babinski persistent right lower extremity.  Follows 1 step commands  LABS:  CBC  Recent Labs Lab 12/31/14 0436 01/01/15 0323 01/02/15 0451  WBC 14.1* 15.7* 15.3*  HGB 10.8* 10.5* 10.2*  HCT 36.7* 34.8* 34.0*  PLT 259 270 314   Coag's  Recent Labs Lab 01/01/15 1454  APTT 44*  INR 1.27   BMET  Recent Labs Lab 12/30/14 0333 12/31/14 0436 01/01/15 0323  NA 152* 152* 150*  K 3.4* 3.8 4.0  CL 118* 117* 115*  CO2 27 27 27   BUN 47* 59* 69*  CREATININE 1.65* 2.13* 2.10*  GLUCOSE 183* 197* 183*   Electrolytes  Recent Labs Lab 12/30/14 0333 12/31/14 0436 01/01/15 0323 01/02/15 0451  CALCIUM 8.9 9.0 8.9  --  MG 2.4 2.5* 2.7* 2.9*  PHOS 2.9 3.7 4.5  --    Sepsis Markers No results for input(s): LATICACIDVEN, PROCALCITON, O2SATVEN in the last 168 hours. ABG  Recent Labs Lab 12/28/14 0402 12/29/14 0432  PHART 7.442 7.422  PCO2ART 40.6 41.7  PO2ART 74.1* 64.3*   Liver Enzymes  Recent Labs Lab 12/30/14 0333 12/31/14 0436 01/01/15 0323  ALBUMIN 2.2* 2.1* 2.2*   Cardiac Enzymes No results for input(s): TROPONINI, PROBNP in the last 168 hours. Glucose  Recent Labs Lab 01/01/15 1118 01/01/15 1555 01/01/15 1958 01/01/15 2356 01/02/15 0342 01/02/15 0736  GLUCAP 163* 157* 143* 155* 127* 132*   Imaging Dg Chest Port 1 View  01/01/2015  CLINICAL  DATA:  Acute respiratory failure with hypoxemia. EXAM: PORTABLE CHEST 1 VIEW COMPARISON:  12/31/2014 FINDINGS: Endotracheal tube is in place with tip 6.8 cm above carina. Nasogastric tube is in place, tip beyond the film beyond the gastroesophageal junction. Heart is enlarged. There patchy infiltrates throughout the lungs bilaterally, unchanged. IMPRESSION: Stable appearance of endotracheal tube and nasogastric tube. Persistent bilateral pulmonary infiltrates. Electronically Signed   By: Nolon Nations M.D.   On: 01/01/2015 08:47        ASSESSMENT / PLAN: 81 year old male with left corona radiata infarct and altered mentation requiring intubation for airway protection. Likely  aspiration pneumonia.   1. Left corona radiata CVA: Management per neurology. Aspirin, Plavix, & Lipitor. 2. Acute hypoxic respiratory failure: Likely secondary  to aspiration pneumonia. -  Ct SBT attempts but planning for trach on 11/18 .Albuterol nebs q6hr, ct  chest PT via bed every 4 hours  to help with secretion clearance.    Aspiration pneumonia: stopped  Unasyn  x10 ds. Tracheal aspirate culture showed only oral flora consistent with aspiration. Rpt culture to see if abx needs to be extended 3. H/O CAD S/P CABG/ICM:  Appreciate Cardiology recommendations. Likely stress/demand ischemia. Echocardiogram -no  wall motion abnormality. On Plavix, Lipitor, & ASA.  Hypertension: resumed aten, ct amlodipin, Hydralazine PRN  for now.  Hyperlipidemia: Lipitor 80mg  via tube qhs. 4. Acute renal failure: improving  with good UOP. Continuing to trend renal function with daily BUN/creatinine. Hold losartan 5. Hypernatremia:allow mild, follow -allow to normalise 6. Hypokalemia: Replace prn  7. Anemia: Mild. No signs of active bleeding. Continuing to trend hemoglobin daily with CBC. 8. Thrombocytopenia: Resolved.  9. Hyperglycemia: Good blood glucose control. Hemoglobin A1c 6.2. Accu-Cheks every 4 hours with low-dose sliding  scale coverage. 10. Hypomagnesemia:  Resolved.  11. History of Hypothyroidism:  Continuing levothyroxine 150 g via tube daily. 12. Diet: Continuing tube feeds per dietary recommendations.    Prophylaxis: SCDs, Protonix IV daily, & heparin subcutaneous every 8 h  Summary -He has not made significant progress for extubation , proceed with  Lurline Idol 11/18, defer peg until swallow eval next week -  d/w wife & daughter 11/17 Plan for LTAC early next week   The patient is critically ill with multiple organ systems failure and requires high complexity decision making for assessment and support, frequent evaluation and titration of therapies, application of advanced monitoring technologies and extensive interpretation of multiple databases. Critical Care Time devoted to patient care services described in this note independent of APP time is 31 minutes.   Kara Mead MD. Shade Flood. [facility] Pager [phone] If no response call [phone]    01/02/2015, 9:03 AM

## 2015-01-02 NOTE — Procedures (Signed)
Name:  CAVANAUGH BUTZLAFF MRN:  [id number] DOB:  Nov 19, 1931  OPERATIVE NOTE  Procedure:  Percutaneous tracheostomy.  Indications:  Ventilator-dependent respiratory failure.  Consent:  Procedure, alternatives, risks and benefits discussed with medical POA.  Questions answered.  Consent obtained.  Anesthesia:  Versed, fent, vec, prop  Procedure summary:  Appropriate equipment was assembled.  The patient was identified as Darius Barber and safety timeout was performed. The patient was placed in supine position with a towel roll behind shoulder blades and neck extended.  Sterile technique was used. The patient's neck and upper chest were prepped using chlorhexidine / alcohol scrub and the field was draped in usual sterile fashion with full body drape. After the adequate sedation / anesthesia was achieved, attention was directed at the midline trachea, where the cricothyroid membrane was palpated. Approximately two fingerbreadths above the sternal notch, a verticle incision was created with a scalpel after local infiltration with 0.2% Lidocaine. Then, using Seldinger technique and a percutaneous tracheostomy set, the trachea was entered with a 14 gauge needle with an overlying sheath. This was all confirmed under direct visualization of a fiberoptic flexible bronchoscope. Entrance into the trachea was identified through the third tracheal ring interspace. Following this, a guidewire was inserted. The needle was removed, leaving the sheath and the guidewire intact. Next, the sheath was removed and a small dilator was inserted. The tracheal rings were then dilated. A #6 Shiley was then opened. The balloon was checked. It was placed over a tracheal dilator, which was then advanced over the guidewire and through the previously dilated tract. The Shiley tracheostomy tube was noted to pass in the trachea with little resistance. The guidewire and dilator tubes were removed from the trachea. An inner cannula was placed  through the tracheostomy tube. The tracheostomy was then secured at the anterior neck with 4 monofilament sutures. The oral endotracheal tube was removed and the ventilator was attached to the newly placed tracheostomy tube. Adequate tidal volumes were noted. The cuff was inflated and no evidence of air leak was noted. No evidence of bleeding was noted. At this point, the procedure was concluded. Post-procedure chest x-ray was ordered.  Complications:  No immediate complications were noted.  Hemodynamic parameters and oxygenation remained stable throughout the procedure.  Estimated blood loss:  Less then 1 mL.  Raylene Miyamoto., MD Pulmonary and [place] Pager: [phone]  01/02/2015, 3:17 PM  Can follow up in trach clinic [phone]

## 2015-01-03 ENCOUNTER — Inpatient Hospital Stay (HOSPITAL_COMMUNITY): Payer: Medicare HMO

## 2015-01-03 DIAGNOSIS — I63312 Cerebral infarction due to thrombosis of left middle cerebral artery: Secondary | ICD-10-CM

## 2015-01-03 LAB — RENAL FUNCTION PANEL
ANION GAP: 9 (ref 5–15)
Albumin: 2.1 g/dL — ABNORMAL LOW (ref 3.5–5.0)
BUN: 72 mg/dL — ABNORMAL HIGH (ref 6–20)
CHLORIDE: 119 mmol/L — AB (ref 101–111)
CO2: 25 mmol/L (ref 22–32)
CREATININE: 1.96 mg/dL — AB (ref 0.61–1.24)
Calcium: 8.7 mg/dL — ABNORMAL LOW (ref 8.9–10.3)
GFR, EST AFRICAN AMERICAN: 35 mL/min — AB (ref >60)
GFR, EST NON AFRICAN AMERICAN: 30 mL/min — AB (ref >60)
Glucose, Bld: 146 mg/dL — ABNORMAL HIGH (ref 65–99)
POTASSIUM: 3.7 mmol/L (ref 3.5–5.1)
Phosphorus: 3.9 mg/dL (ref 2.5–4.6)
Sodium: 153 mmol/L — ABNORMAL HIGH (ref 135–145)

## 2015-01-03 LAB — CBC WITH DIFFERENTIAL/PLATELET
Basophils Absolute: 0 10*3/uL (ref 0.0–0.1)
Basophils Relative: 0 %
EOS ABS: 0.2 10*3/uL (ref 0.0–0.7)
Eosinophils Relative: 2 %
HEMATOCRIT: 32.1 % — AB (ref 39.0–52.0)
HEMOGLOBIN: 9.5 g/dL — AB (ref 13.0–17.0)
LYMPHS ABS: 0.8 10*3/uL (ref 0.7–4.0)
LYMPHS PCT: 7 %
MCH: 27.5 pg (ref 26.0–34.0)
MCHC: 29.6 g/dL — ABNORMAL LOW (ref 30.0–36.0)
MCV: 93 fL (ref 78.0–100.0)
Monocytes Absolute: 0.5 10*3/uL (ref 0.1–1.0)
Monocytes Relative: 4 %
NEUTROS ABS: 10.6 10*3/uL — AB (ref 1.7–7.7)
NEUTROS PCT: 87 %
Platelets: 295 10*3/uL (ref 150–400)
RBC: 3.45 MIL/uL — AB (ref 4.22–5.81)
RDW: 17.4 % — ABNORMAL HIGH (ref 11.5–15.5)
WBC: 12.2 10*3/uL — AB (ref 4.0–10.5)

## 2015-01-03 LAB — GLUCOSE, CAPILLARY
GLUCOSE-CAPILLARY: 133 mg/dL — AB (ref 65–99)
GLUCOSE-CAPILLARY: 178 mg/dL — AB (ref 65–99)
Glucose-Capillary: 106 mg/dL — ABNORMAL HIGH (ref 65–99)
Glucose-Capillary: 116 mg/dL — ABNORMAL HIGH (ref 65–99)
Glucose-Capillary: 118 mg/dL — ABNORMAL HIGH (ref 65–99)
Glucose-Capillary: 138 mg/dL — ABNORMAL HIGH (ref 65–99)

## 2015-01-03 LAB — CBC
HCT: 29.1 % — ABNORMAL LOW (ref 39.0–52.0)
Hemoglobin: 8.7 g/dL — ABNORMAL LOW (ref 13.0–17.0)
MCH: 28 pg (ref 26.0–34.0)
MCHC: 29.9 g/dL — ABNORMAL LOW (ref 30.0–36.0)
MCV: 93.6 fL (ref 78.0–100.0)
PLATELETS: 279 10*3/uL (ref 150–400)
RBC: 3.11 MIL/uL — AB (ref 4.22–5.81)
RDW: 17.9 % — ABNORMAL HIGH (ref 11.5–15.5)
WBC: 11.1 10*3/uL — ABNORMAL HIGH (ref 4.0–10.5)

## 2015-01-03 LAB — CULTURE, RESPIRATORY W GRAM STAIN

## 2015-01-03 LAB — CULTURE, RESPIRATORY

## 2015-01-03 LAB — MAGNESIUM: MAGNESIUM: 2.7 mg/dL — AB (ref 1.7–2.4)

## 2015-01-03 MED ORDER — SODIUM CHLORIDE 0.9 % IV BOLUS (SEPSIS)
1000.0000 mL | Freq: Once | INTRAVENOUS | Status: AC
  Administered 2015-01-03: 1000 mL via INTRAVENOUS

## 2015-01-03 MED ORDER — AMLODIPINE BESYLATE 10 MG PO TABS
10.0000 mg | ORAL_TABLET | Freq: Every day | ORAL | Status: DC
  Administered 2015-01-04: 10 mg via ORAL
  Filled 2015-01-03 (×2): qty 1

## 2015-01-03 NOTE — Progress Notes (Signed)
CPT not done at this time due to pt's actively bleeding trach site. RT will assess again for four am CPT schedule

## 2015-01-03 NOTE — Progress Notes (Signed)
eLink Physician-Brief Progress Note Patient Name: Darius Barber DOB: [birth year] MRN: [id number]   Date of Service  01/03/2015  HPI/Events of Note  NOtified by nursing about some oozing from around trach. Hemodynamically stable.  eICU Interventions  Stat H/H. Will monitor.      Intervention Category Major Interventions: Airway management  Dimas Chyle 01/03/2015, 10:36 PM

## 2015-01-03 NOTE — Progress Notes (Signed)
Entered patient's room for hourly rounding. Cortrak tube had come out significantly. Tube feed discontinued and cortrak removed rest of way. Dr. Oletta Darter notified. Will have day shift RN notify cortrak team.

## 2015-01-03 NOTE — Progress Notes (Signed)
Upon rounds, CortraK was removed from pt nose, MD notified. Orders to wait for Cortrak team to reinsert in the am.

## 2015-01-03 NOTE — Progress Notes (Addendum)
Called and spoke to Dr Lyndel Safe concerning the bleeding from patients trach site. Approx 100-241ml loss estimated by this RN. Split gauze removed at 2030 with small amt drainage and replaced with new dressing--no active bleeding at that point. On arrival to MRI 2115 noted bleeding behind patients neck and on linen behind neck/upper back. While in MRI, bleeding continued to ooze. STAT CBC ordered and plan for Fellow to come and see patient.  2340 MD called again with update on dropping BP and continued bleeding from trach site. Orders received and MD to follow up with Fellow to assess pt.  2347 Dr Corinna Lines called back for update on MD to assess. Orders to pack the trach edges with surgicell until MD arrives.

## 2015-01-03 NOTE — Progress Notes (Signed)
Placed patient back on PRVC due to increased WOB, and desaturation to 87%, patient improved after vent change.

## 2015-01-03 NOTE — Progress Notes (Signed)
Placed patient on PSV 15/5 50% per MD's request.

## 2015-01-03 NOTE — Progress Notes (Signed)
STROKE TEAM PROGRESS NOTE   SUBJECTIVE (INTERVAL HISTORY) No family at bedside. He had trach yesterday. Still not able to wean from vent. Still on PEEP 5 and 50% FiO2. Able to follow simple commands, but still very lethargic. Will repeat MRI today.   OBJECTIVE Temp:  [97.3 F (36.3 C)-99.5 F (37.5 C)] 99.5 F (37.5 C) (11/19 0802) Pulse Rate:  [55-76] 70 (11/19 0800) Cardiac Rhythm:  [-] Normal sinus rhythm (11/19 0800) Resp:  [17-27] 19 (11/19 0800) BP: (63-190)/(40-103) 169/60 mmHg (11/19 0800) SpO2:  [90 %-100 %] 91 % (11/19 0800) FiO2 (%):  [50 %-100 %] 50 % (11/19 0850) Weight:  [211 lb 6.7 oz (95.9 kg)] 211 lb 6.7 oz (95.9 kg) (11/19 0413)  CBC:   Recent Labs Lab 01/02/15 0451 01/03/15 0439  WBC 15.3* 12.2*  NEUTROABS 13.2* 10.6*  HGB 10.2* 9.5*  HCT 34.0* 32.1*  MCV 92.9 93.0  PLT 314 [id number]    Basic Metabolic Panel:   Recent Labs Lab 01/01/15 0323 01/02/15 0451 01/02/15 1102 01/03/15 0439  NA 150*  --  152* 153*  K 4.0  --  3.8 3.7  CL 115*  --  119* 119*  CO2 27  --  28 25  GLUCOSE 183*  --  140* 146*  BUN 69*  --  71* 72*  CREATININE 2.10*  --  2.00* 1.96*  CALCIUM 8.9  --  8.9 8.7*  MG 2.7* 2.9*  --  2.7*  PHOS 4.5  --   --  3.9    Lipid Panel:     Component Value Date/Time   CHOL 129 12/21/2014 0410   TRIG 162* 12/28/2014 0230   HDL 27* 12/21/2014 0410   CHOLHDL 4.8 12/21/2014 0410   VLDL 23 12/21/2014 0410   LDLCALC 79 12/21/2014 0410   Urine Drug Screen:     Component Value Date/Time   LABOPIA NONE DETECTED 12/21/2014 0500   COCAINSCRNUR NONE DETECTED 12/21/2014 0500   LABBENZ POSITIVE* 12/21/2014 0500   AMPHETMU NONE DETECTED 12/21/2014 0500   THCU NONE DETECTED 12/21/2014 0500   LABBARB NONE DETECTED 12/21/2014 0500      IMAGING  Ct Abdomen Wo Contrast 12/21/2014   No evidence of significant hemorrhage within the chest, abdomen or pelvis. Confluent airspace consolidation in the posterior lower lobes. This could represent  infectious infiltrate. Interstitial and alveolar edema is probably pulse present bilaterally.   Ct Head Wo Contrast 12/21/2014   No acute intracranial process.  Old RIGHT Chronic changes including old RIGHT MCA territory infarct and old LEFT basal ganglia lacunar infarcts. Findings of normal pressure hydrocephalus are similar.   Ct Chest Wo Contrast 12/21/2014   No evidence of significant hemorrhage within the chest, abdomen or pelvis. Confluent airspace consolidation in the posterior lower lobes. This could represent infectious infiltrate. Interstitial and alveolar edema is probably pulse present bilaterally.   Dg Chest Port 1 View 12/21/2014   Satisfactory ET tube position. No other significant interval change.  CXR 1 view  01/02/2015   IMPRESSION: Tracheostomy tube projects in good position. No change in diffuse bilateral airspace disease.   MRI Brain 12/21/14 1. Left corona radiata 2.5 cm infarct without hemorrhage or mass effect. 2. No other acute intracranial abnormality. Chronic ischemic disease. Chronic right ICA occlusion. 3. Vertex scalp hematoma.  PHYSICAL EXAM  Physical exam  Temp:  [97.3 F (36.3 C)-99.5 F (37.5 C)] 99.5 F (37.5 C) (11/19 0802) Pulse Rate:  [55-76] 70 (11/19 0800) Resp:  [17-27] 19 (11/19 0800)  BP: (63-190)/(40-103) 169/60 mmHg (11/19 0800) SpO2:  [90 %-100 %] 91 % (11/19 0800) FiO2 (%):  [50 %-100 %] 50 % (11/19 0850) Weight:  [211 lb 6.7 oz (95.9 kg)] 211 lb 6.7 oz (95.9 kg) (11/19 0413)  General - Well nourished, well developed, on trach and vent.  Ophthalmologic - Fundi not visualized due to noncooperation.  Cardiovascular - Regular rate and rhythm.  Mental Status - awake alert, tracking subjects. Following most commands on the left. Patient has a significant delay in following commands.  Cranial Nerves II - XII - II - PERRLA, blinks to threat III, IV, VI - Extraocular movements intact. Blinks/closes eyes to command V/VII-  Corneals  intact, right facial droop VIII - Hearing impaired X - Gag intact. XI - Unable to assess. XII - Unable to assess.  Motor Strength and tone - Strength intact in LUE and 3-/5 LLE, increased LLE tone. RUE 0/5 strength, RLE 2/5 strength, decreased muscle tone on the right.  Sensory - Withdraws weakly to noxious stimuli  Coordination - Unable to assess  Gait and Station - Unable to assess   ASSESSMENT/PLAN Mr. Darius Barber is a 81 y.o. male with history of previous CVA, HLD, h/o Colon CA, HTN, CAD, right carotid artery occlusion, vascular disease, history of a left carotid stent, and renal insufficiency, presenting with aphasia and right-sided weakness. He received IV t-PA at [facility] for NIHSS of 20.    Stroke:  Dominant Left Corona Radiata ischemic infarct likely 2/2 small vessel disease. However, pt seems more deficit than left CR small infarct, will repeat MRI brain.  Resultant  Left hemiplegia  MRI: Left corona radiata 2.5 cm infarct without hemorrhage or mass effect.  Catheter Angiogram: Angiographically occluded right internal carotid artery and the left external carotid artery. Mild intrastent stenosis of the left internal carotid artery.  Carotid Doppler (10/24/2014): Known right internal carotid artery occlusion, patent left internal carotid artery with stent 1-39%.   MRI repeat pending  2D Echo: EF 55-60%.  EEG: Moderate diffuse slowing   LDL - 79  HgbA1c 6.2  VTE prophylaxis - SCDs, Lovenox 40 mg SQ qd Diet NPO time specified  aspirin 81 mg daily and clopidogrel 75 mg daily prior to admission, both restarted.   Ongoing aggressive stroke risk factor management  Therapy recommendations:  SNF  Disposition: Pending  Aspiration Pneumonia  Management per PCCM.    On chest therapy  CXR in AM  Dysphagia  DHT pulled out last night  Need reinsertaion of DHT  Continue TF after DHT  Hypernatremia  Most likely related to diuresis.   On free  water  Acute on CKD  Baseline Cr closer to 1.4->2.13->1.96  CCM on board  May consider increase IVF  Hypothyroidism  Synthroid IV  Change to po once DHT insertion  Hypertension  On the high side Cardene prn gtt for SBP >180  Increase Norvasc to 10mg  daily  Hyperlipidemia  Home meds:  Lipitor 80 mg daily  LDL 79, goal < 70  Lipitor 80 mg resumed  Continue statin at discharge  Other Stroke Risk Factors  Advanced age  Cigarette smoker, quit smoking 35 years ago   Hx stroke/TIA  Coronary artery disease  This patient is critically ill due to respiratory failure, cerebral infarction, right ICA occlusion and at significant risk of neurological worsening, death form recurrent infarction, and cardiac combinations. This patient's care requires constant monitoring of vital signs, hemodynamics, respiratory and cardiac monitoring, review of multiple databases, neurological  assessment, discussion with family, other specialists and medical decision making of high complexity. I spent 35 minutes of neurocritical care time in the care of this patient.  Rosalin Hawking, MD PhD Stroke Neurology 01/03/2015 10:41 AM   To contact Stroke Continuity provider, please refer to [email or web address] After hours, contact General Neurology

## 2015-01-03 NOTE — Progress Notes (Signed)
PULMONARY  / [place]   Name: Darius Barber MRN: [id number] DOB: [birth year]    ADMISSION DATE:  12/21/2014 CONSULTATION DATE: 12/21/2014  REQUESTING CLINICIAN: Dr. Leonie Man PRIMARY SERVICE: Neurology  CHIEF COMPLAINT:  Stroke  BRIEF PATIENT DESCRIPTION: 42 M transferred from [facility] after presenting with acute onset of right sided weakness and aphasia. Given tPA . He went to VIR on arrival with a normal angiogram. GCS 15 on arrival to NSICU but developed AMS with GCS of 7. PCCM consulted for intubation and vent management. prior CVA, CAD, carotid stenosis Course complicated by aspiration pneumonia & prolonged ventilation   SIGNIFICANT EVENTS: 11/6 - Admitted to hospital 11/14 weans on PS 12/5  STUDIES:   CT Head 11/7 - Left corona radiata infarct. No hemorrhage. Old infarcts.  TTE 11/7 - LV normal in size. Mild LVH. EF 55-60%. LA normal in size. RA normal in size. RV normal in size and function. No pericardial effusion.   LINES / TUBES: ETT 11/6 >> 11/18 Trach 11/18 >>   CULTURES: Tracheal Aspirate (11/6):  Oral Flora 11/17 resp >>   ANTIBIOTICS: Unasyn 11/6>>11/16   SUBJECTIVE:  S/p trach 11/18 Tolerated PSv for 1 hour this am  VITAL SIGNS: Temp:  [97.3 F (36.3 C)-99.5 F (37.5 C)] 99.5 F (37.5 C) (11/19 0802) Pulse Rate:  [55-76] 70 (11/19 0800) Resp:  [17-27] 19 (11/19 0800) BP: (63-190)/(40-103) 169/60 mmHg (11/19 0800) SpO2:  [90 %-100 %] 91 % (11/19 0800) FiO2 (%):  [50 %-100 %] 50 % (11/19 0850) Weight:  [95.9 kg (211 lb 6.7 oz)] 95.9 kg (211 lb 6.7 oz) (11/19 0413) HEMODYNAMICS:   VENTILATOR SETTINGS: Vent Mode:  [-] PRVC FiO2 (%):  [50 %-100 %] 50 % Set Rate:  [15 bmp-26 bmp] 26 bmp Vt Set:  [600 mL] 600 mL PEEP:  [5 cmH20] 5 cmH20 Pressure Support:  [10 cmH20] 10 cmH20 Plateau Pressure:  [24 cmH20-32 cmH20] 24 cmH20   INTAKE / OUTPUT: Intake/Output      11/18 0701 - 11/19 0700 11/19 0701 - 11/20 0700    I.V. (mL/kg) 536.2 (5.6) 40 (0.4)   NG/GT 395.1    Total Intake(mL/kg) 931.3 (9.7) 40 (0.4)   Urine (mL/kg/hr) 1250 (0.5) 40 (0.1)   Stool 0 (0)    Total Output 1250 40   Net -318.7 0        Urine Occurrence 2 x    Stool Occurrence 1 x     PHYSICAL EXAMINATION: General:  No distress.  Integument:  Warm & dry. No rash on exposed skin.  HEENT:  No scleral injection. Trach CDI, sutures in place Cardiovascular:  Regular rate & rhythm. Unable to appreciate JVD given body habitus.  Pulmonary:  Coarse breath sounds bilaterally persist. Symmetric chest wall rise  Abdomen: Soft. Normal bowel sounds. Protuberant. Neurological: spontaneously moves left upper extremity. Follows some commands, facial droop  LABS:  CBC  Recent Labs Lab 01/01/15 0323 01/02/15 0451 01/03/15 0439  WBC 15.7* 15.3* 12.2*  HGB 10.5* 10.2* 9.5*  HCT 34.8* 34.0* 32.1*  PLT 270 314 295   Coag's  Recent Labs Lab 01/01/15 1454  APTT 44*  INR 1.27   BMET  Recent Labs Lab 01/01/15 0323 01/02/15 1102 01/03/15 0439  NA 150* 152* 153*  K 4.0 3.8 3.7  CL 115* 119* 119*  CO2 27 28 25   BUN 69* 71* 72*  CREATININE 2.10* 2.00* 1.96*  GLUCOSE 183* 140* 146*   Electrolytes  Recent Labs Lab  12/31/14 0436 01/01/15 0323 01/02/15 0451 01/02/15 1102 01/03/15 0439  CALCIUM 9.0 8.9  --  8.9 8.7*  MG 2.5* 2.7* 2.9*  --  2.7*  PHOS 3.7 4.5  --   --  3.9   Sepsis Markers No results for input(s): LATICACIDVEN, PROCALCITON, O2SATVEN in the last 168 hours. ABG  Recent Labs Lab 12/28/14 0402 12/29/14 0432  PHART 7.442 7.422  PCO2ART 40.6 41.7  PO2ART 74.1* 64.3*   Liver Enzymes  Recent Labs Lab 12/31/14 0436 01/01/15 0323 01/03/15 0439  ALBUMIN 2.1* 2.2* 2.1*   Cardiac Enzymes No results for input(s): TROPONINI, PROBNP in the last 168 hours. Glucose  Recent Labs Lab 01/02/15 1120 01/02/15 1613 01/02/15 1945 01/02/15 2328 01/03/15 0340 01/03/15 0759  GLUCAP 128* 92 99 147* 118* 138*    Imaging Dg Chest Port 1 View  01/02/2015  CLINICAL DATA:  Status post tracheostomy tube placement today. EXAM: PORTABLE CHEST 1 VIEW COMPARISON:  Single view of the chest 01/01/2015. FINDINGS: The patient has a new tracheostomy tube in place with the tip projecting good position at the level of the clavicular heads. NG tube has been removed. Diffuse bilateral airspace disease persists without marked change. Heart size is enlarged. The patient is status post CABG. IMPRESSION: Tracheostomy tube projects in good position. No change in diffuse bilateral airspace disease. Electronically Signed   By: Inge Rise M.D.   On: 01/02/2015 15:29   Dg Abd Portable 1v  01/02/2015  CLINICAL DATA:  Feeding tube placement. EXAM: PORTABLE ABDOMEN - 1 VIEW COMPARISON:  12/22/2014 FINDINGS: A feeding tube is now seen with the tip overlying the distal portion of the duodenum in the left upper quadrant. No evidence of dilated bowel loops. Bilateral airspace disease seen in lung bases. IMPRESSION: Feeding tube tip in distal duodenum. Electronically Signed   By: Earle Gell M.D.   On: 01/02/2015 18:34        ASSESSMENT / PLAN: 81 year old male with left corona radiata infarct and altered mentation requiring intubation for airway protection. Likely  aspiration pneumonia.   1. Left corona radiata CVA: Management per neurology. Aspirin, Plavix, & Lipitor. 2. Acute hypoxic respiratory failure: Likely secondary  to aspiration pneumonia. -  Ct SBT attempts. Goal to ATC soon. Albuterol nebs q6hr, ct  chest PT via bed every 4 hours  to help with secretion clearance.    Aspiration pneumonia: stopped  Unasyn  x10 ds. Tracheal aspirate culture showed only oral flora consistent with aspiration. Rpt culture to see if abx needs to be extended pending 3. H/O CAD S/P CABG/ICM:  Appreciate Cardiology recommendations. Likely stress/demand ischemia. Echocardiogram -no  wall motion abnormality. On Plavix, Lipitor, & ASA.   Hypertension: resumed aten, ct amlodipin, Hydralazine PRN  for now.  Hyperlipidemia: Lipitor 80mg  via tube qhs. 4. Acute renal failure: improving  with good UOP. Continuing to trend renal function with daily BUN/creatinine. Hold losartan 5. Hypernatremia:allow mild, follow -allow to normalise 6. Hypokalemia: Replace prn  7. Anemia: Mild. No signs of active bleeding. Continuing to trend hemoglobin daily with CBC. 8. Thrombocytopenia: Resolved.  9. Hyperglycemia: Good blood glucose control. Hemoglobin A1c 6.2. Accu-Cheks every 4 hours with low-dose sliding scale coverage. 10. Hypomagnesemia:  Resolved.  11. History of Hypothyroidism:  Continuing levothyroxine 150 g via tube daily. 12. Diet: Continuing tube feeds per dietary recommendations.    Prophylaxis: SCDs, Protonix IV daily, & heparin subcutaneous every 8 h  Summary - s/p trach 11/18, tolerated PSV for short time, may be an extended  wean. Hope to ATC soon, defer peg until swallow eval next week -  d/w wife 11/19 Plan for LTAC early next week   The patient is critically ill with multiple organ systems failure and requires high complexity decision making for assessment and support, frequent evaluation and titration of therapies, application of advanced monitoring technologies and extensive interpretation of multiple databases. Critical Care Time devoted to patient care services described in this note independent of APP time is 31 minutes.   Baltazar Apo, MD, PhD 01/03/2015, 10:15 AM [place] [facility] [phone] or if no answer [phone]

## 2015-01-04 ENCOUNTER — Inpatient Hospital Stay (HOSPITAL_COMMUNITY): Payer: Medicare HMO

## 2015-01-04 LAB — BASIC METABOLIC PANEL
ANION GAP: 7 (ref 5–15)
BUN: 68 mg/dL — ABNORMAL HIGH (ref 6–20)
CHLORIDE: 122 mmol/L — AB (ref 101–111)
CO2: 25 mmol/L (ref 22–32)
CREATININE: 1.84 mg/dL — AB (ref 0.61–1.24)
Calcium: 8.5 mg/dL — ABNORMAL LOW (ref 8.9–10.3)
GFR calc non Af Amer: 33 mL/min — ABNORMAL LOW (ref >60)
GFR, EST AFRICAN AMERICAN: 38 mL/min — AB (ref >60)
Glucose, Bld: 137 mg/dL — ABNORMAL HIGH (ref 65–99)
POTASSIUM: 3.7 mmol/L (ref 3.5–5.1)
SODIUM: 154 mmol/L — AB (ref 135–145)

## 2015-01-04 LAB — GLUCOSE, CAPILLARY
GLUCOSE-CAPILLARY: 133 mg/dL — AB (ref 65–99)
GLUCOSE-CAPILLARY: 124 mg/dL — AB (ref 65–99)
Glucose-Capillary: 120 mg/dL — ABNORMAL HIGH (ref 65–99)
Glucose-Capillary: 126 mg/dL — ABNORMAL HIGH (ref 65–99)
Glucose-Capillary: 131 mg/dL — ABNORMAL HIGH (ref 65–99)

## 2015-01-04 LAB — MAGNESIUM: MAGNESIUM: 2.7 mg/dL — AB (ref 1.7–2.4)

## 2015-01-04 LAB — TROPONIN I: Troponin I: 0.35 ng/mL — ABNORMAL HIGH (ref <0.031)

## 2015-01-04 LAB — PHOSPHORUS: PHOSPHORUS: 3.5 mg/dL (ref 2.5–4.6)

## 2015-01-04 MED ORDER — DEXTROSE 5 % IV SOLN
1.0000 g | INTRAVENOUS | Status: AC
  Administered 2015-01-04 – 2015-01-10 (×7): 1 g via INTRAVENOUS
  Filled 2015-01-04 (×9): qty 10

## 2015-01-04 NOTE — Progress Notes (Signed)
No CPT done at this time d/t pt w/ trach bleeding- very large clot over trach area.

## 2015-01-04 NOTE — Progress Notes (Addendum)
   01/04/15 1445 01/04/15 1500  Vitals  BP (!) 118/92 mmHg (!) 84/54 mmHg (MD notified)  MAP (mmHg) 100 65  MD notified of the following changes in pt BP.  No accompanying neuro changes.    No new orders at this time.  Will monitor BP closely

## 2015-01-04 NOTE — Progress Notes (Signed)
STROKE TEAM PROGRESS NOTE   SUBJECTIVE (INTERVAL HISTORY) No family at bedside. He had trach on 01/02/15 and overnight there were significant bleeding at trach site. Hb drop from 10.2 to 8.7. Currently bleeding seems stabilized. However, no feeding tube placed, so pt still on IVF 20cc/h. BP in the normal range but lower than yesterday. Pt neuro stable, more awake and alert today.   OBJECTIVE Temp:  [98.1 F (36.7 C)-99.6 F (37.6 C)] 98.4 F (36.9 C) (11/20 0745) Pulse Rate:  [58-82] 73 (11/20 0800) Cardiac Rhythm:  [-] Normal sinus rhythm (11/19 2000) Resp:  [20-27] 26 (11/20 0800) BP: (90-176)/(49-81) 126/66 mmHg (11/20 0800) SpO2:  [89 %-100 %] 92 % (11/20 0800) FiO2 (%):  [50 %] 50 % (11/20 0800) Weight:  [206 lb 5.6 oz (93.6 kg)] 206 lb 5.6 oz (93.6 kg) (11/20 0500)  CBC:   Recent Labs Lab 01/02/15 0451 01/03/15 0439 01/03/15 2325  WBC 15.3* 12.2* 11.1*  NEUTROABS 13.2* 10.6*  --   HGB 10.2* 9.5* 8.7*  HCT 34.0* 32.1* 29.1*  MCV 92.9 93.0 93.6  PLT 314 295 [id number]    Basic Metabolic Panel:   Recent Labs Lab 01/03/15 0439 01/04/15 0257  NA 153* 154*  K 3.7 3.7  CL 119* 122*  CO2 25 25  GLUCOSE 146* 137*  BUN 72* 68*  CREATININE 1.96* 1.84*  CALCIUM 8.7* 8.5*  MG 2.7* 2.7*  PHOS 3.9 3.5    Lipid Panel:     Component Value Date/Time   CHOL 129 12/21/2014 0410   TRIG 162* 12/28/2014 0230   HDL 27* 12/21/2014 0410   CHOLHDL 4.8 12/21/2014 0410   VLDL 23 12/21/2014 0410   LDLCALC 79 12/21/2014 0410   Urine Drug Screen:     Component Value Date/Time   LABOPIA NONE DETECTED 12/21/2014 0500   COCAINSCRNUR NONE DETECTED 12/21/2014 0500   LABBENZ POSITIVE* 12/21/2014 0500   AMPHETMU NONE DETECTED 12/21/2014 0500   THCU NONE DETECTED 12/21/2014 0500   LABBARB NONE DETECTED 12/21/2014 0500      IMAGING I have personally reviewed the radiological images below and agree with the radiology interpretations. Blue text is my interpretation.  Ct Abdomen Wo  Contrast 12/21/2014   No evidence of significant hemorrhage within the chest, abdomen or pelvis. Confluent airspace consolidation in the posterior lower lobes. This could represent infectious infiltrate. Interstitial and alveolar edema is probably pulse present bilaterally.   Ct Head Wo Contrast 12/21/2014   No acute intracranial process.  Old RIGHT Chronic changes including old RIGHT MCA territory infarct and old LEFT basal ganglia lacunar infarcts. Findings of normal pressure hydrocephalus are similar.   Ct Chest Wo Contrast 12/21/2014   No evidence of significant hemorrhage within the chest, abdomen or pelvis. Confluent airspace consolidation in the posterior lower lobes. This could represent infectious infiltrate. Interstitial and alveolar edema is probably pulse present bilaterally.   Dg Chest Port 1 View 12/21/2014   Satisfactory ET tube position. No other significant interval change.  CXR 1 view  01/02/2015   IMPRESSION: Tracheostomy tube projects in good position. No change in diffuse bilateral airspace disease.   MRI Brain 12/21/14 1. Left corona radiata 2.5 cm infarct without hemorrhage or mass effect. 2. No other acute intracranial abnormality. Chronic ischemic disease. Chronic right ICA occlusion. 3. Vertex scalp hematoma.  01/03/2015  IMPRESSION: Evolving, acute to subacute LEFT corona radiata/ internal capsule infarct without further propagation or new areas of ischemia. Chronically occluded RIGHT internal carotid artery, with old RIGHT MCA  territory infarct. Additional chronic changes including probable normal pressure hydrocephalus. By my reading, there is extension of infarct at PLIC and mesial temporal lobe.   PHYSICAL EXAM  Physical exam  Temp:  [98.1 F (36.7 C)-99.6 F (37.6 C)] 98.4 F (36.9 C) (11/20 0745) Pulse Rate:  [58-82] 73 (11/20 0800) Resp:  [20-27] 26 (11/20 0800) BP: (90-176)/(49-81) 126/66 mmHg (11/20 0800) SpO2:  [89 %-100 %] 92 % (11/20  0800) FiO2 (%):  [50 %] 50 % (11/20 0800) Weight:  [206 lb 5.6 oz (93.6 kg)] 206 lb 5.6 oz (93.6 kg) (11/20 0500)  General - Well nourished, well developed, on trach and vent.  Ophthalmologic - Fundi not visualized due to noncooperation.  Cardiovascular - Regular rate and rhythm.  Mental Status - more awake alert, tracking subjects. Following most commands on the left. Patient has a significant delay in following commands.  Cranial Nerves II - XII - II - PERRLA, blinks to threat III, IV, VI - Extraocular movements intact. Blinks/closes eyes to command V/VII-  Corneals intact, right facial droop VIII - Hearing impaired X - Gag intact. XI - Unable to assess. XII - Unable to assess.  Motor Strength and tone - Strength intact in LUE and 3-/5 LLE, increased LLE tone. RUE 0/5 strength, RLE 2-/5 strength, decreased muscle tone on the right.  Sensory - Withdraws weakly to noxious stimuli  Coordination - Unable to assess  Gait and Station - Unable to assess   ASSESSMENT/PLAN Darius Barber is a 81 y.o. male with history of previous CVA, HLD, h/o Colon CA, HTN, CAD, right carotid artery occlusion, vascular disease, history of a left carotid stent, and renal insufficiency, presenting with aphasia and right-sided weakness. He received IV t-PA at [facility] for NIHSS of 20.    Stroke:  Dominant Left Corona Radiata ischemic infarct likely 2/2 small vessel disease. Repeat MRI brain showed mild extension to PLIC and mesial temporal lobe, explaining the severe neuro deficit.  Resultant  Left hemiplegia  MRI: Left corona radiata 2.5 cm infarct without hemorrhage or mass effect.  Catheter Angiogram: Angiographically occluded right internal carotid artery and the left external carotid artery. Mild intrastent stenosis of the left internal carotid artery.  Carotid Doppler (10/24/2014): Known right internal carotid artery occlusion, patent left internal carotid artery with stent 1-39%.    MRI repeat showed mild extension to PLIC and mesial temporal lobe  2D Echo: EF 55-60%.  EEG: Moderate diffuse slowing   LDL - 79  HgbA1c 6.2  VTE prophylaxis - SCDs, Lovenox 40 mg SQ qd Diet NPO time specified  aspirin 81 mg daily and clopidogrel 75 mg daily prior to admission, both restarted.   Ongoing aggressive stroke risk factor management  Reordered PT/OT/speech  Therapy recommendations:  SNF  Disposition: Pending  Aspiration Pneumonia  Management per PCCM.    On chest therapy  CXR no change, still showing diffuse pathy infiltration.  Dysphagia  DHT has not put in yet  Need reinsertaion of NG tube  Continue TF after NG  Hypernatremia  Most likely related to diuresis and dehydration.   free water once po access  Continue IVF, may consider 1/2 NS  Acute on CKD  Baseline Cr closer to 1.4->2.13->1.96->1.84  CCM on board  Will start TF today to increase intake  Hypothyroidism  Synthroid IV  Change to po once NG insertion  Hypertension  Stable at goal  continue Norvasc to 10mg  daily  Hyperlipidemia  Home meds:  Lipitor 80 mg daily  LDL 79, goal < 70  Lipitor 80 mg resumed  Continue statin at discharge  Other Stroke Risk Factors  Advanced age  Cigarette smoker, quit smoking 35 years ago   Hx stroke/TIA  Coronary artery disease  This patient is critically ill due to respiratory failure, cerebral infarction, right ICA occlusion and at significant risk of neurological worsening, death form recurrent infarction, and cardiac combinations. This patient's care requires constant monitoring of vital signs, hemodynamics, respiratory and cardiac monitoring, review of multiple databases, neurological assessment, discussion with family, other specialists and medical decision making of high complexity. I spent 40 minutes of neurocritical care time in the care of this patient.  Darius Hawking, MD PhD Stroke Neurology 01/04/2015 8:35 AM   To  contact Stroke Continuity provider, please refer to [email or web address] After hours, contact General Neurology

## 2015-01-04 NOTE — Progress Notes (Signed)
[place] Progress Note Patient Name: DEJAY SOOKDEO DOB: [birth year] MRN: [id number]   Date of Service  01/04/2015  HPI/Events of Note  Soft BP  eICU Interventions  Hold amlodipin, aten chk ekg, trop x 1     Intervention Category Intermediate Interventions: Hypotension - evaluation and management  ALVA,RAKESH V. 01/04/2015, 5:50 PM

## 2015-01-04 NOTE — Progress Notes (Signed)
CPT not done at this time due to [facility] actively bleeding.

## 2015-01-04 NOTE — Progress Notes (Signed)
CPT not done due to trach bleeding/clot

## 2015-01-04 NOTE — Progress Notes (Signed)
Increased fio2 to 60% d/t pt sat 88-89%.  Dr Lamonte Sakai aware.  Per MD, ok for RT to increased peep to 8 if needed for weaning/fio2 purposes.

## 2015-01-04 NOTE — Progress Notes (Addendum)
0800 CPT held (until MD can evaluate) d/t trach bleeding/ lg clot.  RN aware.  Also, did not attempt SBT on ATC d/t pt w/ large amount of gauze over trach area, and large amount of bleeding at trach sight.  RN aware.  PT tol 10  PSV well so far.

## 2015-01-04 NOTE — Plan of Care (Signed)
Notified by bedside RN of oozing from tracheostomy site. While at bedside, the nurse and I explored the trach site to evaluate the bleed. I carefully removed some of the gauze and it appears as if the oozing has decreased significantly. Nurse agrees. We have surgicel at bedside but will defer using it at this time as we do not want to disrupt the clot which has caused the bleed to subside. Will continue to monitor.   Randa Lynn, MD Critical Care Medicine [place]

## 2015-01-04 NOTE — Progress Notes (Signed)
Pharmacy Note: Vigilanz safety alert for FEW Ecoli in respiratory cultures.   Called Dr. Lamonte Sakai Lucianne Muss is resistant to Unasyn and patient received 10 days of Unasyn. Patient is not weaning well on vent.   Plan: Telephone orders received for 7 day course of Rocephin.   Sloan Leiter, PharmD, BCPS Clinical Pharmacist [phone] 01/04/2015, 11:33 AM

## 2015-01-04 NOTE — Progress Notes (Signed)
Pt w/ increased WOB noted, increased RR.  Changed pt back to full vent support RN aware & RN attempting to reinsert NG tube.

## 2015-01-04 NOTE — Progress Notes (Signed)
CPT not done today d/t trach bleeding/clot.  RN aware.

## 2015-01-04 NOTE — Progress Notes (Signed)
PULMONARY  / [place]   Name: Darius Barber MRN: [id number] DOB: 1931-05-21    ADMISSION DATE:  12/21/2014 CONSULTATION DATE: 12/21/2014  REQUESTING CLINICIAN: Dr. Leonie Man PRIMARY SERVICE: Neurology  CHIEF COMPLAINT:  Stroke  BRIEF PATIENT DESCRIPTION: 81 M transferred from [facility] after presenting with acute onset of right sided weakness and aphasia. Given tPA . He went to VIR on arrival with a normal angiogram. GCS 15 on arrival to NSICU but developed AMS with GCS of 7. PCCM consulted for intubation and vent management. prior CVA, CAD, carotid stenosis Course complicated by aspiration pneumonia & prolonged ventilation   SIGNIFICANT EVENTS: 11/6 - Admitted to hospital 11/14 weans on PS 12/5  STUDIES:   CT Head 11/7 - Left corona radiata infarct. No hemorrhage. Old infarcts.  TTE 11/7 - LV normal in size. Mild LVH. EF 55-60%. LA normal in size. RA normal in size. RV normal in size and function. No pericardial effusion.   LINES / TUBES: ETT 11/6 >> 11/18 Trach 11/18 >>   CULTURES: Tracheal Aspirate (11/6):  Oral Flora 11/17 resp >>   ANTIBIOTICS: Unasyn 11/6>>11/16   SUBJECTIVE:  Oozing from trach site starting 11/19 pm.  Tolerating PSV 10 this am  Pulled his NGT out last pm  VITAL SIGNS: Temp:  [98.1 F (36.7 C)-99.6 F (37.6 C)] 98.4 F (36.9 C) (11/20 0745) Pulse Rate:  [58-82] 73 (11/20 0800) Resp:  [20-27] 26 (11/20 0800) BP: (90-176)/(49-81) 126/66 mmHg (11/20 0800) SpO2:  [89 %-100 %] 92 % (11/20 0800) FiO2 (%):  [50 %] 50 % (11/20 0800) Weight:  [93.6 kg (206 lb 5.6 oz)] 93.6 kg (206 lb 5.6 oz) (11/20 0500) HEMODYNAMICS:   VENTILATOR SETTINGS: Vent Mode:  [-] PRVC FiO2 (%):  [50 %] 50 % Set Rate:  [26 bmp] 26 bmp Vt Set:  [600 mL] 600 mL PEEP:  [5 cmH20] 5 cmH20 Pressure Support:  [15 cmH20] 15 cmH20 Plateau Pressure:  [26 cmH20-31 cmH20] 31 cmH20   INTAKE / OUTPUT: Intake/Output      11/19 0701 - 11/20 0700  11/20 0701 - 11/21 0700   I.V. (mL/kg) 480 (5.1) 40 (0.4)   NG/GT     IV Piggyback 1066.7    Total Intake(mL/kg) 1546.7 (16.5) 40 (0.4)   Urine (mL/kg/hr) 1140 (0.5) 625 (3.4)   Stool 0 (0)    Total Output 1140 625   Net +406.7 -585        Urine Occurrence 2 x    Stool Occurrence 1 x     PHYSICAL EXAMINATION: General:  No distress.  Integument:  Warm & dry. No rash on exposed skin.  HEENT:  No scleral injection. Trach with dark clot on guaze and trach-tie, no active bleeding noted Cardiovascular:  Regular rate & rhythm. Unable to appreciate JVD given body habitus.  Pulmonary:  Coarse breath sounds bilaterally persist. Symmetric chest wall rise  Abdomen: Soft. Normal bowel sounds. Protuberant. Neurological: spontaneously moves left upper extremity. Follows some commands, facial droop  LABS:  CBC  Recent Labs Lab 01/02/15 0451 01/03/15 0439 01/03/15 2325  WBC 15.3* 12.2* 11.1*  HGB 10.2* 9.5* 8.7*  HCT 34.0* 32.1* 29.1*  PLT 314 295 279   Coag's  Recent Labs Lab 01/01/15 1454  APTT 44*  INR 1.27   BMET  Recent Labs Lab 01/02/15 1102 01/03/15 0439 01/04/15 0257  NA 152* 153* 154*  K 3.8 3.7 3.7  CL 119* 119* 122*  CO2 28 25 25   BUN  71* 72* 68*  CREATININE 2.00* 1.96* 1.84*  GLUCOSE 140* 146* 137*   Electrolytes  Recent Labs Lab 01/01/15 0323 01/02/15 0451 01/02/15 1102 01/03/15 0439 01/04/15 0257  CALCIUM 8.9  --  8.9 8.7* 8.5*  MG 2.7* 2.9*  --  2.7* 2.7*  PHOS 4.5  --   --  3.9 3.5   Sepsis Markers No results for input(s): LATICACIDVEN, PROCALCITON, O2SATVEN in the last 168 hours. ABG  Recent Labs Lab 12/29/14 0432  PHART 7.422  PCO2ART 41.7  PO2ART 64.3*   Liver Enzymes  Recent Labs Lab 12/31/14 0436 01/01/15 0323 01/03/15 0439  ALBUMIN 2.1* 2.2* 2.1*   Cardiac Enzymes No results for input(s): TROPONINI, PROBNP in the last 168 hours. Glucose  Recent Labs Lab 01/03/15 1216 01/03/15 1625 01/03/15 1942 01/03/15 2330  01/04/15 0330 01/04/15 0736  GLUCAP 133* 178* 106* 116* 133* 120*   Imaging Mr Brain Wo Contrast  01/03/2015  CLINICAL DATA:  Tracheostomy yesterday, unable to wean from vent. Lethargy. History of hypertension, hyperlipidemia, stroke, mild renal insufficiency, carotid artery occlusion. EXAM: MRI HEAD WITHOUT CONTRAST TECHNIQUE: Multiplanar, multiecho pulse sequences of the brain and surrounding structures were obtained without intravenous contrast. COMPARISON:  MRI of the head December 21, 2014 FINDINGS: LEFT corona radiata to internal capsule reduced diffusion with normalizing ADC values, at site of prior infarct. No new areas of acute ischemia. No susceptibility artifact to suggest hemorrhage. Old LEFT basal ganglia lacunar infarcts. Moderate to severe ventriculomegaly, predominately involving the third and lateral ventricles with disproportionate sulcal effacement at the convexities. RIGHT frontal and temporal encephalomalacia extending to the basal ganglia, with insular involvement. Patchy to confluent supratentorial white matter FLAIR T2 hyperintensities. No midline shift, mass effect or mass lesions. No abnormal extra-axial fluid collections. Chronically occluded RIGHT internal carotid artery with loss of flow void. Status post bilateral ocular lens implants. Moderate paranasal sinus mucosal thickening. Moderate mastoid effusions. No abnormal sellar expansion. No cerebellar tonsillar ectopia. No suspicious calvarial bone marrow signal. IMPRESSION: Evolving, acute to subacute LEFT corona radiata/ internal capsule infarct without further propagation or new areas of ischemia. Chronically occluded RIGHT internal carotid artery, with old RIGHT MCA territory infarct. Additional chronic changes including probable normal pressure hydrocephalus. Electronically Signed   By: Elon Alas M.D.   On: 01/03/2015 22:46   Dg Chest Port 1 View  01/02/2015  CLINICAL DATA:  Status post tracheostomy tube placement  today. EXAM: PORTABLE CHEST 1 VIEW COMPARISON:  Single view of the chest 01/01/2015. FINDINGS: The patient has a new tracheostomy tube in place with the tip projecting good position at the level of the clavicular heads. NG tube has been removed. Diffuse bilateral airspace disease persists without marked change. Heart size is enlarged. The patient is status post CABG. IMPRESSION: Tracheostomy tube projects in good position. No change in diffuse bilateral airspace disease. Electronically Signed   By: Inge Rise M.D.   On: 01/02/2015 15:29   Dg Abd Portable 1v  01/03/2015  CLINICAL DATA:  Encounter for imaging study to confirm nasogastric tube placement. EXAM: PORTABLE ABDOMEN - 1 VIEW COMPARISON:  01/02/2015 FINDINGS: Single view of the abdomen demonstrates a feeding tube that extends into the stomach and appears to terminate near the ligament of Treitz. Moderate stool burden in the abdomen. Gas in the colon. IMPRESSION: Feeding tube tip in the expected region of the ligament of Treitz or proximal jejunum. Electronically Signed   By: Markus Daft M.D.   On: 01/03/2015 11:05   Dg Abd Portable  1v  01/02/2015  CLINICAL DATA:  Feeding tube placement. EXAM: PORTABLE ABDOMEN - 1 VIEW COMPARISON:  12/22/2014 FINDINGS: A feeding tube is now seen with the tip overlying the distal portion of the duodenum in the left upper quadrant. No evidence of dilated bowel loops. Bilateral airspace disease seen in lung bases. IMPRESSION: Feeding tube tip in distal duodenum. Electronically Signed   By: Earle Gell M.D.   On: 01/02/2015 18:34        ASSESSMENT / PLAN: 81 year old male with left corona radiata infarct and altered mentation requiring intubation for airway protection. Likely  aspiration pneumonia.   1. Left corona radiata CVA: Management per neurology. Aspirin, Plavix, & Lipitor. 2. Acute hypoxic respiratory failure: Likely secondary  to aspiration pneumonia. -  Ct SBT attempts. Goal to ATC soon.  Albuterol nebs q6hr, ct  chest PT via bed every 4 hours  to help with secretion clearance.    Aspiration pneumonia: stopped  Unasyn  x10 ds. Tracheal aspirate culture showed only oral flora consistent with aspiration. Rpt culture to see if abx needs to be extended pending Bleeding at trach site >> will defer changing dressing or adding surgi-seal at this time as there appears to be hemostasis. Would carefully change gauze and dressing on 11/21.  3. H/O CAD S/P CABG/ICM:  Appreciate Cardiology recommendations. Likely stress/demand ischemia. Echocardiogram -no  wall motion abnormality. On Plavix, Lipitor, & ASA; will continue for now but consider holding if trach oozing returns.  Hypertension: resumed aten, ct amlodipin, Hydralazine PRN  for now.  Hyperlipidemia: Lipitor 80mg  via tube qhs. 4. Acute renal failure: improving  with good UOP. Continuing to trend renal function with daily BUN/creatinine. Hold losartan 5. Hypernatremia:allow mild, follow -allow to normalise 6. Hypokalemia: Replace prn  7. Anemia: Mild. No signs of active bleeding. Continuing to trend hemoglobin daily with CBC. 8. Thrombocytopenia: Resolved.  9. Hyperglycemia: Good blood glucose control. Hemoglobin A1c 6.2. Accu-Cheks every 4 hours with low-dose sliding scale coverage. 10. Hypomagnesemia:  Resolved.  11. History of Hypothyroidism:  Continuing levothyroxine 150 g via tube daily. 12. Diet: Continuing tube feeds per dietary recommendations.    Prophylaxis: SCDs, Protonix IV daily, & heparin subcutaneous every 8 h  Summary - s/p trach 11/18, oozing noted on 11/19. Appears to have hemostasis with large clot now on gauze. Tolerating PSV, may be an extended wean. Hope to ATC soon, defer peg until swallow eval next week -  d/w wife 11/19. Replace NGT on 11/20 in meantime.  Plan for LTAC early next week   The patient is critically ill with multiple organ systems failure and requires high complexity decision making for  assessment and support, frequent evaluation and titration of therapies, application of advanced monitoring technologies and extensive interpretation of multiple databases. Critical Care Time devoted to patient care services described in this note independent of APP time is 32 minutes.   Baltazar Apo, MD, PhD 01/04/2015, 8:58 AM [facility] [phone] or if no answer [phone]

## 2015-01-05 LAB — BASIC METABOLIC PANEL
ANION GAP: 8 (ref 5–15)
Anion gap: 8 (ref 5–15)
BUN: 70 mg/dL — ABNORMAL HIGH (ref 6–20)
BUN: 64 mg/dL — AB (ref 6–20)
CHLORIDE: 122 mmol/L — AB (ref 101–111)
CHLORIDE: 123 mmol/L — AB (ref 101–111)
CO2: 26 mmol/L (ref 22–32)
CO2: 26 mmol/L (ref 22–32)
CREATININE: 1.86 mg/dL — AB (ref 0.61–1.24)
Calcium: 8.7 mg/dL — ABNORMAL LOW (ref 8.9–10.3)
Calcium: 8.8 mg/dL — ABNORMAL LOW (ref 8.9–10.3)
Creatinine, Ser: 1.81 mg/dL — ABNORMAL HIGH (ref 0.61–1.24)
GFR calc Af Amer: 39 mL/min — ABNORMAL LOW (ref >60)
GFR calc non Af Amer: 32 mL/min — ABNORMAL LOW (ref >60)
GFR calc non Af Amer: 33 mL/min — ABNORMAL LOW (ref >60)
GFR, EST AFRICAN AMERICAN: 37 mL/min — AB (ref >60)
GLUCOSE: 169 mg/dL — AB (ref 65–99)
Glucose, Bld: 136 mg/dL — ABNORMAL HIGH (ref 65–99)
POTASSIUM: 3.5 mmol/L (ref 3.5–5.1)
POTASSIUM: 3.9 mmol/L (ref 3.5–5.1)
SODIUM: 157 mmol/L — AB (ref 135–145)
Sodium: 156 mmol/L — ABNORMAL HIGH (ref 135–145)

## 2015-01-05 LAB — CULTURE, RESPIRATORY W GRAM STAIN: Special Requests: NORMAL

## 2015-01-05 LAB — GLUCOSE, CAPILLARY
GLUCOSE-CAPILLARY: 111 mg/dL — AB (ref 65–99)
Glucose-Capillary: 133 mg/dL — ABNORMAL HIGH (ref 65–99)
Glucose-Capillary: 134 mg/dL — ABNORMAL HIGH (ref 65–99)
Glucose-Capillary: 139 mg/dL — ABNORMAL HIGH (ref 65–99)
Glucose-Capillary: 146 mg/dL — ABNORMAL HIGH (ref 65–99)
Glucose-Capillary: 153 mg/dL — ABNORMAL HIGH (ref 65–99)
Glucose-Capillary: 164 mg/dL — ABNORMAL HIGH (ref 65–99)

## 2015-01-05 LAB — CBC
HEMATOCRIT: 28.1 % — AB (ref 39.0–52.0)
HEMOGLOBIN: 8.4 g/dL — AB (ref 13.0–17.0)
MCH: 28.7 pg (ref 26.0–34.0)
MCHC: 29.9 g/dL — ABNORMAL LOW (ref 30.0–36.0)
MCV: 95.9 fL (ref 78.0–100.0)
Platelets: 320 10*3/uL (ref 150–400)
RBC: 2.93 MIL/uL — AB (ref 4.22–5.81)
RDW: 18.7 % — ABNORMAL HIGH (ref 11.5–15.5)
WBC: 10.7 10*3/uL — ABNORMAL HIGH (ref 4.0–10.5)

## 2015-01-05 LAB — MAGNESIUM: MAGNESIUM: 2.8 mg/dL — AB (ref 1.7–2.4)

## 2015-01-05 LAB — CULTURE, RESPIRATORY

## 2015-01-05 MED ORDER — FREE WATER
250.0000 mL | Status: DC
Start: 2015-01-05 — End: 2015-01-09
  Administered 2015-01-05 – 2015-01-09 (×32): 250 mL

## 2015-01-05 NOTE — Progress Notes (Signed)
Pt placed back on full vent support for night time rest.

## 2015-01-05 NOTE — Progress Notes (Signed)
STROKE TEAM PROGRESS NOTE   SUBJECTIVE (INTERVAL HISTORY) Wife is at bedside. He had trach on 01/02/15 and bleeding at trach site seems stopped and stable. Hb drop from 10.2 to 8.7, yesterday but today Hb 8.4, also stabilized. NG tube placed, on TF. Need to consider PEG placement.   OBJECTIVE Temp:  [98.1 F (36.7 C)-99.4 F (37.4 C)] 99.4 F (37.4 C) (11/21 1125) Pulse Rate:  [51-79] 73 (11/21 1137) Cardiac Rhythm:  [-] Normal sinus rhythm;Sinus bradycardia (11/21 0400) Resp:  [10-26] 24 (11/21 1137) BP: (77-197)/(46-92) 119/62 mmHg (11/21 1137) SpO2:  [90 %-100 %] 92 % (11/21 1137) FiO2 (%):  [50 %-60 %] 50 % (11/21 1137) Weight:  [208 lb 5.4 oz (94.5 kg)] 208 lb 5.4 oz (94.5 kg) (11/21 0354)  CBC:   Recent Labs Lab 01/02/15 0451 01/03/15 0439 01/03/15 2325 01/05/15 0251  WBC 15.3* 12.2* 11.1* 10.7*  NEUTROABS 13.2* 10.6*  --   --   HGB 10.2* 9.5* 8.7* 8.4*  HCT 34.0* 32.1* 29.1* 28.1*  MCV 92.9 93.0 93.6 95.9  PLT 314 295 279 [id number]    Basic Metabolic Panel:   Recent Labs Lab 01/03/15 0439 01/04/15 0257 01/05/15 0251  NA 153* 154* 156*  K 3.7 3.7 3.5  CL 119* 122* 122*  CO2 25 25 26   GLUCOSE 146* 137* 136*  BUN 72* 68* 70*  CREATININE 1.96* 1.84* 1.86*  CALCIUM 8.7* 8.5* 8.7*  MG 2.7* 2.7*  --   PHOS 3.9 3.5  --     Lipid Panel:     Component Value Date/Time   CHOL 129 12/21/2014 0410   TRIG 162* 12/28/2014 0230   HDL 27* 12/21/2014 0410   CHOLHDL 4.8 12/21/2014 0410   VLDL 23 12/21/2014 0410   LDLCALC 79 12/21/2014 0410   Urine Drug Screen:     Component Value Date/Time   LABOPIA NONE DETECTED 12/21/2014 0500   COCAINSCRNUR NONE DETECTED 12/21/2014 0500   LABBENZ POSITIVE* 12/21/2014 0500   AMPHETMU NONE DETECTED 12/21/2014 0500   THCU NONE DETECTED 12/21/2014 0500   LABBARB NONE DETECTED 12/21/2014 0500      IMAGING I have personally reviewed the radiological images below and agree with the radiology interpretations. Blue text is my  interpretation.  Ct Abdomen Wo Contrast 12/21/2014   No evidence of significant hemorrhage within the chest, abdomen or pelvis. Confluent airspace consolidation in the posterior lower lobes. This could represent infectious infiltrate. Interstitial and alveolar edema is probably pulse present bilaterally.   Ct Head Wo Contrast 12/21/2014   No acute intracranial process.  Old RIGHT Chronic changes including old RIGHT MCA territory infarct and old LEFT basal ganglia lacunar infarcts. Findings of normal pressure hydrocephalus are similar.   Ct Chest Wo Contrast 12/21/2014   No evidence of significant hemorrhage within the chest, abdomen or pelvis. Confluent airspace consolidation in the posterior lower lobes. This could represent infectious infiltrate. Interstitial and alveolar edema is probably pulse present bilaterally.   Dg Chest Port 1 View 12/21/2014   Satisfactory ET tube position. No other significant interval change.  CXR 1 view  01/02/2015   IMPRESSION: Tracheostomy tube projects in good position. No change in diffuse bilateral airspace disease.   MRI Brain 12/21/14 1. Left corona radiata 2.5 cm infarct without hemorrhage or mass effect. 2. No other acute intracranial abnormality. Chronic ischemic disease. Chronic right ICA occlusion. 3. Vertex scalp hematoma.  01/03/2015  IMPRESSION: Evolving, acute to subacute LEFT corona radiata/ internal capsule infarct without further propagation or new  areas of ischemia. Chronically occluded RIGHT internal carotid artery, with old RIGHT MCA territory infarct. Additional chronic changes including probable normal pressure hydrocephalus. By my reading, there is extension of infarct at PLIC and mesial temporal lobe.   PHYSICAL EXAM  Temp:  [98.1 F (36.7 C)-99.4 F (37.4 C)] 99.4 F (37.4 C) (11/21 1125) Pulse Rate:  [51-79] 73 (11/21 1137) Resp:  [10-26] 24 (11/21 1137) BP: (77-197)/(46-92) 119/62 mmHg (11/21 1137) SpO2:  [90 %-100 %] 92 %  (11/21 1137) FiO2 (%):  [50 %-60 %] 50 % (11/21 1137) Weight:  [208 lb 5.4 oz (94.5 kg)] 208 lb 5.4 oz (94.5 kg) (11/21 0354)  General - Well nourished, well developed, on trach and vent.  Ophthalmologic - Fundi not visualized due to noncooperation.  Cardiovascular - Regular rate and rhythm.  Mental Status - more awake alert, tracking subjects. Following central commands, but not peripheral commands on the left.   Cranial Nerves II - XII - II - PERRLA, blinks to threat bilaterally III, IV, VI - Extraocular movements intact. Blinks/closes eyes to command V/VII-  Corneals intact, right facial droop VIII - Hearing impaired X - Gag intact. XI - Unable to assess. XII - Unable to assess.  Motor Strength and tone - Strength intact in LUE and 3-/5 LLE, increased LLE tone. RUE 0/5 strength, RLE 2-/5 strength, decreased muscle tone on the right.  Sensory - Withdraws weakly to noxious stimuli  Coordination - Unable to assess  Gait and Station - Unable to assess   ASSESSMENT/PLAN Mr. Darius Barber is a 81 y.o. male with history of previous CVA, HLD, h/o Colon CA, HTN, CAD, right carotid artery occlusion, vascular disease, history of a left carotid stent, and renal insufficiency, presenting with aphasia and right-sided weakness. He received IV t-PA at [facility] for NIHSS of 20.    Stroke:  Dominant Left Corona Radiata ischemic infarct likely 2/2 small vessel disease. Repeat MRI brain showed mild extension to PLIC and mesial temporal lobe, explaining the severe neuro deficit.  Resultant  Left hemiplegia  MRI: Left corona radiata 2.5 cm infarct without hemorrhage or mass effect.  MRI repeat showed mild extension to PLIC and mesial temporal lobe  Catheter Angiogram: Angiographically occluded right internal carotid artery and the left external carotid artery. Mild intrastent stenosis of the left internal carotid artery.  Carotid Doppler (10/24/2014): Known right internal carotid  artery occlusion, patent left internal carotid artery with stent 1-39%.   2D Echo: EF 55-60%.  EEG: Moderate diffuse slowing   LDL - 79  HgbA1c 6.2  VTE prophylaxis - SCDs, Lovenox 40 mg SQ qd Diet NPO time specified  aspirin 81 mg daily and clopidogrel 75 mg daily prior to admission, both restarted.   Ongoing aggressive stroke risk factor management  Therapy recommendations:  SELECT  Disposition: Pending  Aspiration Pneumonia  Management per PCCM.    On chest therapy  CXR no change, still showing diffuse pathy infiltration.  Dysphagia  On NG tube and TF  Speech on board  Consider PEG placement  Hypernatremia  Most likely related to diuresis and dehydration.   Increased free water via NG tube  Acute on CKD  Baseline Cr closer to 1.4->2.13->1.96->1.84->1.86  CCM on board  On free water and TF   Hypothyroidism  Synthroid po via tube  Hypertension  Stable at goal  continue Norvasc to 10mg  daily  Hyperlipidemia  Home meds:  Lipitor 80 mg daily  LDL 79, goal < 70  Lipitor 80 mg  resumed  Continue statin at discharge  Other Stroke Risk Factors  Advanced age  Cigarette smoker, quit smoking 35 years ago   Hx stroke/TIA  Coronary artery disease  This patient is critically ill due to respiratory failure, cerebral infarction, right ICA occlusion and at significant risk of neurological worsening, death form recurrent infarction, and cardiac combinations. This patient's care requires constant monitoring of vital signs, hemodynamics, respiratory and cardiac monitoring, review of multiple databases, neurological assessment, discussion with family, other specialists and medical decision making of high complexity. I spent 35 minutes of neurocritical care time in the care of this patient.  Neurology will sign off for now. Please call with questions. Pt will follow up with Dr. Erlinda Hong at [facility] in about 3 months. Thanks for the consult.   Rosalin Hawking, MD  PhD Stroke Neurology 01/05/2015 12:03 PM   To contact Stroke Continuity provider, please refer to [email or web address] After hours, contact General Neurology

## 2015-01-05 NOTE — Progress Notes (Signed)
RT Note: CPT on hold due to trach bleeding/clot. RT will continue to monitor.

## 2015-01-05 NOTE — Progress Notes (Signed)
Physical Therapy Treatment Patient Details Name: Darius Barber MRN: [id number] DOB: 1931/04/23 Today's Date: 01/05/2015    History of Present Illness pt presents with L Corona Radiata Infarct with concern for aspiration causing Hypoxic Respiratory Failure, which required intubation on 11/6.  pt with hx of CAD, CABG, CVA, and Colon CA.    PT Comments    Pt con't to have R sided neglect, poor command follow, R flaccidity, a tracheostomy, and limited active participation. Pt did tolerate sitting EOB x10 min with maxA. Con't to recommend LTACh.  Follow Up Recommendations  LTACH     Equipment Recommendations  None recommended by PT    Recommendations for Other Services       Precautions / Restrictions Precautions Precautions: Fall Precaution Comments: track/vented Restrictions Weight Bearing Restrictions: No    Mobility  Bed Mobility Overal bed mobility: Needs Assistance;+2 for physical assistance Bed Mobility: Rolling;Sidelying to Sit;Supine to Sit;Sit to Supine;Sit to Sidelying Rolling: Total assist;+2 for physical assistance Sidelying to sit: Total assist;+2 for physical assistance Supine to sit: Total assist;+2 for physical assistance Sit to supine: Total assist;+2 for physical assistance Sit to sidelying: Total assist;+2 for physical assistance General bed mobility comments: dependent to move to [place] transfer comment: not appropriate at this time  Ambulation/Gait                 Stairs            Wheelchair Mobility    Modified Rankin (Stroke Patients Only) Modified Rankin (Stroke Patients Only) Pre-Morbid Rankin Score: No symptoms Modified Rankin: Severe disability     Balance Overall balance assessment: Needs assistance Sitting-balance support: Single extremity supported;Feet supported Sitting balance-Leahy Scale: Zero Sitting balance - Comments: sat EOB  x 10 min, dependent to maintain midline ,  attempted to have pt turn head to R however unable.                            Cognition Arousal/Alertness: Awake/alert Behavior During Therapy: Flat affect Overall Cognitive Status: Impaired/Different from baseline Area of Impairment: Following commands       Following Commands: Follows one step commands inconsistently;Follows one step commands with increased time       General Comments: followed command 1/3 of time    Exercises General Exercises - Lower Extremity Ankle Circles/Pumps: PROM;Both;5 reps Long Arc Quad: PROM;AAROM;Both;5 reps    General Comments General comments (skin integrity, edema, etc.): attempted to have pt reach with L UE however pt non-participatory. No active mvmt of LEs, worked on increasing proprioception via pushing LEs into floor      Pertinent Vitals/Pain Pain Assessment:  (no signs of pain/unable to report)    Home Living                      Prior Function            PT Goals (current goals can now be found in the care plan section) Progress towards PT goals: Progressing toward goals    Frequency  Min 2X/week    PT Plan Current plan remains appropriate    Co-evaluation             End of Session   Activity Tolerance: Patient limited by fatigue Patient left: in bed;in CPM;with nursing/sitter in room     Time: [phone]  PT Time Calculation (min) (ACUTE ONLY): 26 min  Charges:  $Therapeutic Activity: 23-37 mins                    G Codes:      Kingsley Callander 01/05/2015, 3:06 PM   Kittie Plater, PT, DPT Pager #: [phone] Office #: [phone]

## 2015-01-05 NOTE — Progress Notes (Signed)
CCM notified of hypotension and trach site bleeding.  Instructed to continue to monitor BP and MD/PA will be by to assess trach bleeding.  Will continue to monitor.    Gus Rankin 01/05/2015 11:35 PM

## 2015-01-05 NOTE — Progress Notes (Signed)
PULMONARY  / [place]   Name: Darius Barber MRN: [id number] DOB: [birth year]    ADMISSION DATE:  12/21/2014 CONSULTATION DATE: 12/21/2014  REQUESTING CLINICIAN: Dr. Leonie Man PRIMARY SERVICE: Neurology  CHIEF COMPLAINT:  Stroke  BRIEF PATIENT DESCRIPTION: 38 M transferred from [facility] after presenting with acute onset of right sided weakness and aphasia. Given tPA . He went to VIR on arrival with a normal angiogram. GCS 15 on arrival to NSICU but developed AMS with GCS of 7. PCCM consulted for intubation and vent management. prior CVA, CAD, carotid stenosis Course complicated by aspiration pneumonia & prolonged ventilation   SIGNIFICANT EVENTS: 11/6 - Admitted to hospital 11/14 weans on PS 12/5 11/21 weaning on PSV 10/5  STUDIES:   CT Head 11/7 - Left corona radiata infarct. No hemorrhage. Old infarcts.  TTE 11/7 - LV normal in size. Mild LVH. EF 55-60%. LA normal in size. RA normal in size. RV normal in size and function. No pericardial effusion.   LINES / TUBES: ETT 11/6 >> 11/18 Trach 11/18 >>   CULTURES: Tracheal Aspirate (11/6):  Oral Flora 11/17 resp >>   ANTIBIOTICS: Unasyn 11/6>>11/16   SUBJECTIVE:  Still oozing from trach site some On pressure support again this morning Follows simple commands  VITAL SIGNS: Temp:  [98.1 F (36.7 C)-99.2 F (37.3 C)] 98.5 F (36.9 C) (11/21 0756) Pulse Rate:  [51-79] 70 (11/21 0828) Resp:  [10-26] 23 (11/21 0828) BP: (77-197)/(46-92) 126/75 mmHg (11/21 0828) SpO2:  [90 %-100 %] 94 % (11/21 0828) FiO2 (%):  [50 %-60 %] 50 % (11/21 0828) Weight:  [94.5 kg (208 lb 5.4 oz)] 94.5 kg (208 lb 5.4 oz) (11/21 0354) HEMODYNAMICS:   VENTILATOR SETTINGS: Vent Mode:  [-] PSV;CPAP FiO2 (%):  [50 %-60 %] 50 % Set Rate:  [26 bmp] 26 bmp Vt Set:  [600 mL] 600 mL PEEP:  [5 cmH20] 5 cmH20 Pressure Support:  [10 cmH20] 10 cmH20 Plateau Pressure:  [31 cmH20-33 cmH20] 33 cmH20   INTAKE /  OUTPUT: Intake/Output      11/20 0701 - 11/21 0700 11/21 0701 - 11/22 0700   I.V. (mL/kg) 420 (4.4)    NG/GT 1589.3    IV Piggyback     Total Intake(mL/kg) 2009.3 (21.3)    Urine (mL/kg/hr) 2855 (1.3)    Stool 0 (0)    Total Output 2855     Net -845.7          Stool Occurrence 1 x     PHYSICAL EXAMINATION: General:  Comfortable on vent HENT: no active bleeding from tracheostomy, clot noted PULM: Rhonchi bilaterally, vent suported breaths CV: RRR, no mgr GI: BS+, soft, nontender MSK: anasarca Neuro: Awake, follows commands (raise your left hand)  LABS:  CBC  Recent Labs Lab 01/03/15 0439 01/03/15 2325 01/05/15 0251  WBC 12.2* 11.1* 10.7*  HGB 9.5* 8.7* 8.4*  HCT 32.1* 29.1* 28.1*  PLT 295 279 320   Coag's  Recent Labs Lab 01/01/15 1454  APTT 44*  INR 1.27   BMET  Recent Labs Lab 01/03/15 0439 01/04/15 0257 01/05/15 0251  NA 153* 154* 156*  K 3.7 3.7 3.5  CL 119* 122* 122*  CO2 25 25 26   BUN 72* 68* 70*  CREATININE 1.96* 1.84* 1.86*  GLUCOSE 146* 137* 136*   Electrolytes  Recent Labs Lab 01/01/15 0323 01/02/15 0451  01/03/15 0439 01/04/15 0257 01/05/15 0251  CALCIUM 8.9  --   < > 8.7* 8.5* 8.7*  MG  2.7* 2.9*  --  2.7* 2.7*  --   PHOS 4.5  --   --  3.9 3.5  --   < > = values in this interval not displayed. Sepsis Markers No results for input(s): LATICACIDVEN, PROCALCITON, O2SATVEN in the last 168 hours. ABG No results for input(s): PHART, PCO2ART, PO2ART in the last 168 hours. Liver Enzymes  Recent Labs Lab 12/31/14 0436 01/01/15 0323 01/03/15 0439  ALBUMIN 2.1* 2.2* 2.1*   Cardiac Enzymes  Recent Labs Lab 01/04/15 1840  TROPONINI 0.35*   Glucose  Recent Labs Lab 01/04/15 1134 01/04/15 1552 01/04/15 1930 01/04/15 2348 01/05/15 0342 01/05/15 0731  GLUCAP 124* 126* 131* 146* 111* 164*   Imaging Mr Brain Wo Contrast  01/03/2015  CLINICAL DATA:  Tracheostomy yesterday, unable to wean from vent. Lethargy. History of  hypertension, hyperlipidemia, stroke, mild renal insufficiency, carotid artery occlusion. EXAM: MRI HEAD WITHOUT CONTRAST TECHNIQUE: Multiplanar, multiecho pulse sequences of the brain and surrounding structures were obtained without intravenous contrast. COMPARISON:  MRI of the head December 21, 2014 FINDINGS: LEFT corona radiata to internal capsule reduced diffusion with normalizing ADC values, at site of prior infarct. No new areas of acute ischemia. No susceptibility artifact to suggest hemorrhage. Old LEFT basal ganglia lacunar infarcts. Moderate to severe ventriculomegaly, predominately involving the third and lateral ventricles with disproportionate sulcal effacement at the convexities. RIGHT frontal and temporal encephalomalacia extending to the basal ganglia, with insular involvement. Patchy to confluent supratentorial white matter FLAIR T2 hyperintensities. No midline shift, mass effect or mass lesions. No abnormal extra-axial fluid collections. Chronically occluded RIGHT internal carotid artery with loss of flow void. Status post bilateral ocular lens implants. Moderate paranasal sinus mucosal thickening. Moderate mastoid effusions. No abnormal sellar expansion. No cerebellar tonsillar ectopia. No suspicious calvarial bone marrow signal. IMPRESSION: Evolving, acute to subacute LEFT corona radiata/ internal capsule infarct without further propagation or new areas of ischemia. Chronically occluded RIGHT internal carotid artery, with old RIGHT MCA territory infarct. Additional chronic changes including probable normal pressure hydrocephalus. Electronically Signed   By: Elon Alas M.D.   On: 01/03/2015 22:46   Dg Chest Port 1 View  01/04/2015  CLINICAL DATA:  Acute respiratory failure EXAM: PORTABLE CHEST 1 VIEW COMPARISON:  01/02/2015 FINDINGS: Tracheostomy remains in good position.  No pneumothorax Severe diffuse bilateral airspace disease shows mild progression. No significant effusion. Bibasilar  atelectasis IMPRESSION: Severe bilateral airspace disease with mild progression from the prior study. Electronically Signed   By: Franchot Gallo M.D.   On: 01/04/2015 09:04   Dg Abd Portable 1v  01/04/2015  CLINICAL DATA:  NG tube placement EXAM: PORTABLE ABDOMEN - 1 VIEW COMPARISON:  Abdominal radiograph from earlier today. FINDINGS: Tip of enteric tube is in the proximal stomach. The side port of the enteric tube is in the region of the esophagogastric junction. No disproportionately dilated small bowel loops. Mild to moderate stool throughout the colon. No evidence of pneumatosis or pneumoperitoneum. IMPRESSION: Tip of enteric tube is in the proximal stomach, with the side-port in the region of the esophagogastric junction, consider advancing 5 cm. Electronically Signed   By: Ilona Sorrel M.D.   On: 01/04/2015 16:29   Dg Abd Portable 1v  01/04/2015  CLINICAL DATA:  Evaluate NG tube placement. EXAM: PORTABLE ABDOMEN - 1 VIEW COMPARISON:  Abdominal radiograph 01/04/2015. FINDINGS: Interval insertion of enteric tube with tip and side-port projecting over the stomach. Unchanged heterogeneous opacities bilateral lung bases. Nonobstructed bowel gas pattern. IMPRESSION: NG tube tip  and side-port project over the stomach. Electronically Signed   By: Lovey Newcomer M.D.   On: 01/04/2015 12:57   Dg Abd Portable 1v  01/04/2015  CLINICAL DATA:  NG tube placement EXAM: PORTABLE ABDOMEN - 1 VIEW COMPARISON:  01/03/2015 abdominal radiograph FINDINGS: No enteric tube is visualized on this image. No dilated small bowel loops. Moderate stool throughout the visualized colon. No evidence of pneumatosis or pneumoperitoneum. Visualized median sternotomy wires are aligned and intact. Patchy lung opacities are noted at both lung bases. IMPRESSION: 1. No enteric tube is visualized on this image. Recommend repositioning/replacement of the enteric tube, which is likely coiled in the upper chest or neck. 2. Patchy bibasilar lung  opacities, correlate with chest radiograph. These results were called by telephone at the time of interpretation on 01/04/2015 at 10:52 am to [place], who verbally acknowledged these results. Electronically Signed   By: Ilona Sorrel M.D.   On: 01/04/2015 10:53   Dg Abd Portable 1v  01/03/2015  CLINICAL DATA:  Encounter for imaging study to confirm nasogastric tube placement. EXAM: PORTABLE ABDOMEN - 1 VIEW COMPARISON:  01/02/2015 FINDINGS: Single view of the abdomen demonstrates a feeding tube that extends into the stomach and appears to terminate near the ligament of Treitz. Moderate stool burden in the abdomen. Gas in the colon. IMPRESSION: Feeding tube tip in the expected region of the ligament of Treitz or proximal jejunum. Electronically Signed   By: Markus Daft M.D.   On: 01/03/2015 11:05    CXR images from 11/20 personally reviewed> bilateral airspace disease    ASSESSMENT / PLAN: 81 year old male with left corona radiata infarct and altered mentation requiring intubation for airway protection. Aspiration pneumonia with e coli in sputum.  1. Left corona radiata CVA: Management per neurology. Aspirin, Plavix, & Lipitor  2. Acute hypoxic respiratory failure: Secondary  to aspiration pneumonia. -  Ct SBT attempts.  Goal to ATC soon.  Albuterol nebs q6hr,  Chest PT via bed every 4 hours  to help with secretion clearance    E coli pneumonia (culture positive 11/18, resistant to Unasyn) Continue ceftriaxone 7 days  Bleeding at trach site >> will change dressing today  3. H/O CAD S/P CABG/ICM:  Appreciate Cardiology recommendations. Likely stress/demand ischemia. Echocardiogram -no  wall motion abnormality. On Plavix, Lipitor, & ASA; will continue for now but consider holding if trach oozing returns.  Hypertension continue atenolol, amlodipin, Hydralazine PRN  for now.  Hyperlipidemia: Lipitor 80mg  via tube qhs.  4. Acute renal failure: improving  with good UOP Continuing to  trend renal function with daily BUN/creatinine. Hold losartan  5. Hypernatremia:allow mild> increase free water today   6. Anemia: Mild. No signs of active bleeding. . Continuing to trend hemoglobin daily with CBC.  7. Hyperglycemia: Good blood glucose control. Hemoglobin A1c 6.2. Accu-Cheks every 4 hours with low-dose sliding scale coverage.   8. History of Hypothyroidism:  Continuing levothyroxine 150 g via tube daily.  9. Diet: Continuing tube feeds per dietary recommendations.    Prophylaxis: SCDs, Protonix IV daily, & heparin subcutaneous every 8 h  Summary - s/p trach 11/18, oozing noted on 11/19. Appears to have hemostasis with large clot now on gauze. Plan dressing change today Plan for LTAC this week   The patient is critically ill with multiple organ systems failure and requires high complexity decision making for assessment and support, frequent evaluation and titration of therapies, application of advanced monitoring technologies and extensive interpretation of multiple databases. Critical  Care Time devoted to patient care services described in this note independent of APP time is 37 minutes.   Roselie Awkward, MD [place] [facility] Pager: [phone] Cell: [phone] After 3pm or if no response, call [phone]

## 2015-01-05 NOTE — Evaluation (Signed)
Speech Language Pathology Evaluation Patient Details Name: Darius Barber MRN: [id number] DOB: [birth year] Today's Date: 01/05/2015 Time: [id number] SLP Time Calculation (min) (ACUTE ONLY): 7 min  Problem List:  Patient Active Problem List   Diagnosis Date Noted  . Acute respiratory failure with hypoxemia ([place])   . Ventilator dependence ([place])   . Cerebrovascular accident (CVA) due to thrombosis of precerebral artery ([place])   . HLD (hyperlipidemia)   . Coronary artery disease involving coronary bypass graft of native heart with unspecified angina pectoris   . Cerebral infarction due to occlusion of right carotid artery ([place])   . FHx: carotid endarterectomy   . CVA (cerebral infarction) 12/21/2014  . Aspiration pneumonia ([place])   . Hypomagnesemia   . Carotid artery stenosis without cerebral infarction 08/20/2013  . Occlusion and stenosis of carotid artery without mention of cerebral infarction 07/16/2013  . Sinus bradycardia 06/19/2013  . Mitral regurgitation 06/18/2013  . Carotid artery disease without cerebral infarction ([place]) 06/18/2013  . Hx of CABG 06/18/2013  . CKD (chronic kidney disease) stage 3, GFR 30-59 ml/min 06/18/2013  . Chronic diastolic CHF (congestive heart failure) ([place]) 06/18/2013  . Statin intolerance 06/18/2013  . Cough 06/18/2013  . Testosterone deficiency 06/18/2013  . Colon cancer ([place]) 06/18/2013  . PTSD (post-traumatic stress disorder) 06/18/2013  . Ejection fraction   . Diverticulosis   . Stroke ([place])   . LVH (left ventricular hypertrophy)   . HYPOTHYROIDISM 11/14/2008  . HYPERLIPIDEMIA-MIXED 11/14/2008  . Essential hypertension 11/14/2008  . Coronary artery disease involving coronary bypass graft without angina pectoris 11/14/2008  . AORTIC VALVE SCLEROSIS 11/14/2008   Past Medical History:  Past Medical History  Diagnosis Date  . Hyperlipidemia   . Colon cancer ([place])   . Essential hypertension   . Diverticulosis   . Coronary artery disease      CABG in 2006 LIMA- LAD, SVG-1D, SVG-OM1, SVG-PDA  . Carotid artery occlusion     Occluded RICA, remote left CEA, left carotid stent July 2015 - Dr. Trula Slade  . Hypothyroidism   . PAD (peripheral artery disease) ([place])   . Renal insufficiency, mild   . History of stroke    Past Surgical History:  Past Surgical History  Procedure Laterality Date  . Coronary artery bypass graft  Oct 2006    H[facility]rickson - LIMA to LAD, SVG to D1, SVG to OM1, SVG to PDA  . Hemorrhoid surgery    . Hemicolectomy  2007    Dr.Matt Tsuei  . Carotid endarterectomy    . Arch aortogram N/A 08/06/2013    Procedure: ARCH AORTOGRAM;  Surgeon: Serafina Mitchell, MD;  Location[facility] C[facility] LAB;  Service: Cardiovascular;  Laterality: N/A;  . Carotid angiogram N/A 08/06/2013    Procedure: CAROTID ANGIOGRAM;  Surgeon: Serafina Mitchell, MD;  Location: [facility] CATH LAB;  Service: Cardiovascular;  Laterality: N/A;  . Carotid stent insertion Left 08/20/2013    Procedure: CAROTID STENT INSERTION;  Surgeon: Serafina Mitchell, MD;  Location: [facility] CATH LAB;  Service: Cardiovascular;  Laterality: Left;  . Radiology with anesthesia N/A 12/21/2014    Procedure: RADIOLOGY WITH ANESTHESIA;  Surgeon: Luanne Bras, MD;  Location: [place];  Service: Radiology;  Laterality: N/A;   HPI:  81 y.o. male transfer from [facility] after presenting with acute onset of right sided weakness and aphasia.MRI revealed evolving acute to subacute LEFT corona radiata/ internal capsule. PMH: CAD, CVA, renal insuff, colon cancer. Intubated 11/6 and received trach 11/18.   Assessment / Plan /  Recommendation Clinical Impression  Brief/limited speech-language-cognition assessment initiated during in-line PMSV assessment. Unable to donn valve due to inadequate exhaled tidal volume following cuff deflation with additional time and cueing provided. Pt followed 30% of one step simple commands. Yes/no head gestures/responses are unreliable exhibiting difficulty nodding head but  able to slightly shake for "no". Pt unable to mouth single word (name) possibly due to decreased comprehension or oral apraxia. SLP will continue diagnostic treatment of language and cognitive abilities.      SLP Assessment  Patient needs continued Speech Lanaguage Pathology Services    Follow Up Recommendations  LTACH    Frequency and Duration min 3x week  2 weeks      SLP Evaluation Prior Functioning  Cognitive/Linguistic Baseline: Within functional limits  Lives With: Spouse   Cognition  Overall Cognitive Status: Impaired/Different from baseline Arousal/Alertness: Awake/alert Orientation Level:  (unable to establish y/n response) Attention: Sustained Sustained Attention: Impaired Sustained Attention Impairment: Verbal basic Memory:  (will assess) Awareness:  (will assess in therapy) Problem Solving:  (will assess further)    Comprehension  Auditory Comprehension Overall Auditory Comprehension: Impaired Yes/No Questions:  (need to establish y/n response system) Commands: Impaired One Step Basic Commands: 25-49% accurate Visual Recognition/Discrimination Discrimination: Not tested Reading Comprehension Reading Status: Not tested    Expression Expression Primary Mode of Expression:  (unable to donn vent valve today) Verbal Expression Overall Verbal Expression:  (no response to mouth his name) Written Expression Dominant Hand: Right Written Expression: Not tested   Oral / Motor Oral Motor/Sensory Function Overall Oral Motor/Sensory Function: Moderate impairment (will continue to assess) Motor Speech Overall Motor Speech:  (n/a)    [place] Siren 01/05/2015, 3:44 PM   Orbie Pyo Colvin Caroli.Ed [employer] [phone]

## 2015-01-05 NOTE — Progress Notes (Signed)
E link notified that pt still awaiting provider to check trach.

## 2015-01-05 NOTE — Progress Notes (Addendum)
Trach site bleeding again. Discussed with Dr. Titus Mould. Orders received to hold asprin plavix and heparin. Dr. Titus Mould to send provider to examine trach.

## 2015-01-05 NOTE — Progress Notes (Signed)
Nutrition Follow-up  INTERVENTION:   Continue:  Jevity 1.2 @ 55 ml/hr 30 ml Prostat TID  Provides: 1884 kcal (104% of needs), 118 grams protein, and 1069 ml H2O.  Total free water: 2069 ml  NUTRITION DIAGNOSIS:   Inadequate oral intake related to inability to eat as evidenced by NPO status. Ongoing.   GOAL:   Patient will meet greater than or equal to 90% of their needs Met.   MONITOR:   I & O's, Vent status, Labs, Weight trends, TF tolerance  ASSESSMENT:   81 yo male from [place] with altered mental status, Rt sided weakness, aphasia with concern for CVA. S/P tPA and neuro IR. Acute respiratory failure 2nd to aspiration PNA and compromised airway.  Patient is currently intubated on ventilator support MV: 9.75mn Temp (24hrs), Avg:98.5 F (36.9 C), Min:98.1 F (36.7 C), Max:99.2 F (37.3 C)  11/18: trach 250 ml H2O every 6 hours = 1000 ml Labs reviewed: sodium elevated (156) CBG's: 111-164 Plan for LTACH this week. Trach with clot.   Diet Order:  Diet NPO time specified  Skin:  Reviewed, no issues  Last BM:  11/19  Height:   Ht Readings from Last 1 Encounters:  12/21/14 _0  (1.753 m)   Weight:   Wt Readings from Last 1 Encounters:  01/05/15 208 lb 5.4 oz (94.5 kg)   Ideal Body Weight:  72.7 kg  BMI:  Body mass index is 30.75 kg/(m^2).  Estimated Nutritional Needs:   Kcal:  1864  Protein:  110-120 grams  Fluid:  > 1.9 L/day  EDUCATION NEEDS:   No education needs identified at this time  HGlen Dale LVerdunville CDavisboroPager [phone] Hours Pager

## 2015-01-05 NOTE — Care Management Important Message (Signed)
Important Message  Patient Details  Name: Darius Barber MRN: [id number] Date of Birth: 01/19/1932   Medicare Important Message Given:  Yes    [name] 01/05/2015, 10:20 AM

## 2015-01-05 NOTE — Evaluation (Addendum)
Passy-Muir Speaking Valve - Evaluation Patient Details  Name: Darius Barber MRN: [id number] Date of Birth: Feb 12, 1932  Today's Date: 01/05/2015 Time: 1002-1027 SLP Time Calculation (min) (ACUTE ONLY): 25 min  Past Medical History:  Past Medical History  Diagnosis Date  . Hyperlipidemia   . Colon cancer ([place])   . Essential hypertension   . Diverticulosis   . Coronary artery disease     CABG in 2006 LIMA- LAD, SVG-1D, SVG-OM1, SVG-PDA  . Carotid artery occlusion     Occluded RICA, remote left CEA, left carotid stent July 2015 - Dr. Trula Slade  . Hypothyroidism   . PAD (peripheral artery disease) ([place])   . Renal insufficiency, mild   . History of stroke    Past Surgical History:  Past Surgical History  Procedure Laterality Date  . Coronary artery bypass graft  Oct 2006    [facility] - LIMA to LAD, SVG to D1, SVG to OM1, SVG to PDA  . Hemorrhoid surgery    . Hemicolectomy  2007    Dr.Matt Tsuei  . Carotid endarterectomy    . Arch aortogram N/A 08/06/2013    Procedure: ARCH AORTOGRAM;  Surgeon: Serafina Mitchell, MD;  Location[facility] CATH LAB;  Service: Cardiovascular;  Laterality: N/A;  . Carotid angiogram N/A 08/06/2013    Procedure: CAROTID ANGIOGRAM;  Surgeon: Serafina Mitchell, MD;  Location: [facility] CATH LAB;  Service: Cardiovascular;  Laterality: N/A;  . Carotid stent insertion Left 08/20/2013    Procedure: CAROTID STENT INSERTION;  Surgeon: Serafina Mitchell, MD;  Location: [facility] CATH LAB;  Service: Cardiovascular;  Laterality: Left;  . Radiology with anesthesia N/A 12/21/2014    Procedure: RADIOLOGY WITH ANESTHESIA;  Surgeon: Luanne Bras, MD;  Location: [place];  Service: Radiology;  Laterality: N/A;   HPI:  81 y.o. male transfer from [facility] after presenting with acute onset of right sided weakness and aphasia.MRI revealed evolving acute to subacute LEFT corona radiata/ internal capsule   Assessment / Plan / Recommendation Clinical Impression  In-line PMSV assessment  completed with pt on pressure support of 10. Pt tolerated cuff deflation via RT. Evidence of airflow to upper airway present with tangible flow from nares and oral cavity with only minimal drop in exhaled tidal volume. Audible congested respirations detected. Verbal and visual cues provided for volitional cough were weak and unable to mobilize pharyngeal secretions for oral suctioning. RT deep suctioned and over the course of 20 minutes pt's exhaled volume did not adequately drop indicative of an inadequate upper airway patency, therefore valve was not placed. Pt's RR, HR and SpO2 were within normal limits. Pt calm and cooperative and following limited one step directions limited by aphasia and suspected apraxia. Will continue ST intervention.          SLP Assessment  Patient needs continued Speech Lanaguage Pathology Services    Follow Up Recommendations  LTACH    Frequency and Duration min 2x/week  2 weeks    PMSV Trial PMSV was placed for:  (N/A) Able to redirect subglottic air through upper airway: Yes Able to Attain Phonation:  (N/A) Voice Quality:  (N/A) Able to Expectorate Secretions: No Intelligibility: Unable to assess (comment) Respirations During Trial:  (18-23) SpO2 During Trial: 99 % Pulse During Trial: 70 Behavior: Alert;Controlled;Cooperative   Tracheostomy Tube       Vent Dependency  Vent Mode: PSV;CPAP PEEP: 5 cmH20 Pressure Support: 10 cmH20 FiO2 (%): 50 %    Cuff Deflation Trial Tolerated Cuff Deflation: Yes Length  of Time for Cuff Deflation Trial: 20 Behavior: Alert;Cooperative;Controlled   [place] Siren 01/05/2015, 11:21 AM   Orbie Pyo Colvin Caroli[employer]Ed [employer] [phone]

## 2015-01-06 LAB — GLUCOSE, CAPILLARY
GLUCOSE-CAPILLARY: 145 mg/dL — AB (ref 65–99)
GLUCOSE-CAPILLARY: 137 mg/dL — AB (ref 65–99)
GLUCOSE-CAPILLARY: 121 mg/dL — AB (ref 65–99)
GLUCOSE-CAPILLARY: 130 mg/dL — AB (ref 65–99)
Glucose-Capillary: 127 mg/dL — ABNORMAL HIGH (ref 65–99)
Glucose-Capillary: 134 mg/dL — ABNORMAL HIGH (ref 65–99)

## 2015-01-06 LAB — CBC WITH DIFFERENTIAL/PLATELET
Basophils Absolute: 0 K/uL (ref 0.0–0.1)
Basophils Relative: 0 %
Eosinophils Absolute: 0.3 K/uL (ref 0.0–0.7)
Eosinophils Relative: 3 %
HCT: 26 % — ABNORMAL LOW (ref 39.0–52.0)
Hemoglobin: 7.7 g/dL — ABNORMAL LOW (ref 13.0–17.0)
Lymphocytes Relative: 12 %
Lymphs Abs: 1.1 K/uL (ref 0.7–4.0)
MCH: 28.4 pg (ref 26.0–34.0)
MCHC: 29.6 g/dL — ABNORMAL LOW (ref 30.0–36.0)
MCV: 95.9 fL (ref 78.0–100.0)
Monocytes Absolute: 0.3 K/uL (ref 0.1–1.0)
Monocytes Relative: 3 %
Neutro Abs: 7.2 K/uL (ref 1.7–7.7)
Neutrophils Relative %: 82 %
Platelets: 286 K/uL (ref 150–400)
RBC: 2.71 MIL/uL — ABNORMAL LOW (ref 4.22–5.81)
RDW: 19.4 % — ABNORMAL HIGH (ref 11.5–15.5)
WBC: 8.9 K/uL (ref 4.0–10.5)

## 2015-01-06 LAB — BASIC METABOLIC PANEL
ANION GAP: 6 (ref 5–15)
BUN: 66 mg/dL — ABNORMAL HIGH (ref 6–20)
CALCIUM: 8.5 mg/dL — AB (ref 8.9–10.3)
CO2: 25 mmol/L (ref 22–32)
Chloride: 123 mmol/L — ABNORMAL HIGH (ref 101–111)
Creatinine, Ser: 1.91 mg/dL — ABNORMAL HIGH (ref 0.61–1.24)
GFR, EST AFRICAN AMERICAN: 36 mL/min — AB (ref >60)
GFR, EST NON AFRICAN AMERICAN: 31 mL/min — AB (ref >60)
Glucose, Bld: 165 mg/dL — ABNORMAL HIGH (ref 65–99)
POTASSIUM: 3.6 mmol/L (ref 3.5–5.1)
SODIUM: 154 mmol/L — AB (ref 135–145)

## 2015-01-06 LAB — LACTIC ACID, PLASMA: LACTIC ACID, VENOUS: 1.4 mmol/L (ref 0.5–2.0)

## 2015-01-06 MED ORDER — DEXTROSE 5 % IV SOLN
0.0000 ug/min | INTRAVENOUS | Status: DC
  Filled 2015-01-06: qty 1

## 2015-01-06 NOTE — Progress Notes (Signed)
Pt currently remains on ventilator; continuing weaning attempts.  Insurance payor denied LTAC, and peer to peer was done yesterday.  Decision upheld, stating that pt would need to be on ventilator total of 21 days with continued weaning attempted.  Pt with large hematoma at trach site; Plavix on hold.    Met with wife at bedside; updated on insurance denial of LTAC.  Offered emotional support to pt/spouse; pt awake, shook my hand this morning.    Will continue to follow progress.    Reinaldo Raddle, RN, BSN  Trauma/Neuro ICU Case Manager [phone]

## 2015-01-06 NOTE — Progress Notes (Signed)
Occupational Therapy Treatment Patient Details Name: Darius Barber MRN: [id number] DOB: 09/15/1931 Today's Date: 01/06/2015    History of present illness pt presents with L Corona Radiata Infarct with concern for aspiration causing Hypoxic Respiratory Failure, which required intubation on 11/6.  pt with hx of CAD, CABG, CVA, and Colon CA.   OT comments  Pt appearing to respond to cold applied to R UE, but remains flaccid.  Worked on functional use of L UE with washcloth to face, required max assist.  Pt responding to commands inconsistently. Primarily keeping eyes closed throughout session.  Follow Up Recommendations  LTACH    Equipment Recommendations       Recommendations for Other Services      Precautions / Restrictions Precautions Precautions: Fall Precaution Comments: trach/vent       Mobility Bed Mobility               General bed mobility comments: +2 total assist to reposition in bed  Transfers                 General transfer comment: not appropriate at this time    Balance                                   ADL Overall ADL's : Needs assistance/impaired     Grooming: Wash/dry face;Bed level Grooming Details (indicate cue type and reason): hand over hand assist to bring L hand to face with washcloth, did not initiate or continue without max assist                               General ADL Comments: Worked on turning head to R to locate visual and auditory targets, turns eyes.  Gentle assist to turn head to neutral and slightly beyond.      Vision                     Perception     Praxis      Cognition   Behavior During Therapy: Flat affect Overall Cognitive Status: Impaired/Different from baseline Area of Impairment: Following commands        Following Commands: Follows one step commands inconsistently;Follows one step commands with increased time            Extremity/Trunk Assessment                Exercises Other Exercises Other Exercises: Retrograde massage of R UE followed by PROM all areas. Also addressed AAROM with L UE.   Shoulder Instructions       General Comments      Pertinent Vitals/ Pain       Pain Assessment: Faces Faces Pain Scale: No hurt  Home Living                                          Prior Functioning/Environment              Frequency Min 2X/week     Progress Toward Goals  OT Goals(current goals can now be found in the care plan section)  Progress towards OT goals: Not progressing toward goals - comment     Plan Discharge plan remains appropriate    Co-evaluation  End of Session Equipment Utilized During Treatment: Other (comment) (vent)   Activity Tolerance Patient tolerated treatment well   Patient Left in bed;with call bell/phone within reach   Nurse Communication          Time: [id number] OT Time Calculation (min): 17 min  Charges: OT General Charges $OT Visit: 1 Procedure OT Treatments $Neuromuscular Re-education: 8-22 mins  [name] 01/06/2015, 9:17 AM  [phone]

## 2015-01-06 NOTE — Progress Notes (Signed)
Called CCM, Dr. Lake Bells regarding low bp readings reported by night shift nurse from [id number] with systolic in the 0000000 and [id number].  He ordered a Neo-Synephrine drip to keep MAP > 60. After call, I switched bp cuff to other (right) arm and got readings of 121/68, 161/56 and 190/67.  Switching cuff back to left arm resulted in a bp reding of 130/56, MAP 77.   Will not start Neo at this time and will wait for CCM to round to determine if any intervention is needed. [place], [place] C 01/06/2015 7:40 AM

## 2015-01-06 NOTE — Progress Notes (Signed)
Pt's vent continues to pop off from trach.  RT called as well as CCM.  Pt's RR increased and using accessory muscles.  CCM notified and will send PA to assess.  Will continue to monitor.    Gus Rankin 01/06/2015. 11:54 PM

## 2015-01-06 NOTE — Progress Notes (Signed)
RT Note: CPT on hold due to trach bleeding/ clot. RT will continue to monitor.

## 2015-01-06 NOTE — Progress Notes (Signed)
PULMONARY  / [place]   Name: Darius Barber MRN: [id number] DOB: 1931-09-03    ADMISSION DATE:  12/21/2014 CONSULTATION DATE: 12/21/2014  REQUESTING CLINICIAN: Dr. Leonie Man PRIMARY SERVICE: Neurology  CHIEF COMPLAINT:  Stroke  BRIEF PATIENT DESCRIPTION: 13 M transferred from [facility] after presenting with acute onset of right sided weakness and aphasia. Given tPA . He went to VIR on arrival with a normal angiogram. GCS 15 on arrival to NSICU but developed AMS with GCS of 7. PCCM consulted for intubation and vent management. prior CVA, CAD, carotid stenosis Course complicated by aspiration pneumonia & prolonged ventilation   SIGNIFICANT EVENTS: 11/6 - Admitted to hospital 11/14 weans on PS 12/5 11/18 bedside tracheostomy Titus Mould) 11/20 tracheostomy site bleeding 11/21 weaning on PSV 10/5, tracheostomy dressing changed, further bleeding, ASA, Plavix, sub cutaneous heparin held     SUBJECTIVE:  weaning on PSV 10/5, tracheostomy dressing changed, further bleeding, ASA, Plavix, sub cutaneous heparin held  VITAL SIGNS: Temp:  [98 F (36.7 C)-100.8 F (38.2 C)] 98.7 F (37.1 C) (11/22 0800) Pulse Rate:  [57-89] 65 (11/22 0900) Resp:  [15-44] 26 (11/22 0900) BP: (65-190)/(41-104) 65/41 mmHg (11/22 0900) SpO2:  [86 %-100 %] 92 % (11/22 0900) FiO2 (%):  [50 %] 50 % (11/22 0816) Weight:  [97.2 kg (214 lb 4.6 oz)] 97.2 kg (214 lb 4.6 oz) (11/22 0418) HEMODYNAMICS:   VENTILATOR SETTINGS: Vent Mode:  [-] PRVC FiO2 (%):  [50 %] 50 % Set Rate:  [26 bmp] 26 bmp Vt Set:  [600 mL] 600 mL PEEP:  [5 cmH20] 5 cmH20 Pressure Support:  [10 cmH20] 10 cmH20 Plateau Pressure:  [19 cmH20-28 cmH20] 28 cmH20   INTAKE / OUTPUT: Intake/Output      11/21 0701 - 11/22 0700 11/22 0701 - 11/23 0700   I.V. (mL/kg) 480 (4.9) 40 (0.4)   NG/GT 2570 110   IV Piggyback 100    Total Intake(mL/kg) 3150 (32.4) 150 (1.5)   Urine (mL/kg/hr) 2150 (0.9)    Stool      Total Output 2150     Net +1000 +150         PHYSICAL EXAMINATION: General:  Comfortable on vent HENT: no active bleeding from tracheostomy, large clot noted, no surrounding cellulitis or purulence PULM: Rhonchi bilaterally, vent suported breaths CV: RRR, no mgr GI: BS+, soft, nontender MSK: anasarca Neuro: Somnolent today, arouses to touch, voice, RN's note he follows commands this AM  LABS:  CBC  Recent Labs Lab 01/03/15 2325 01/05/15 0251 01/06/15 0305  WBC 11.1* 10.7* 8.9  HGB 8.7* 8.4* 7.7*  HCT 29.1* 28.1* 26.0*  PLT 279 320 286   Coag's  Recent Labs Lab 01/01/15 1454  APTT 44*  INR 1.27   BMET  Recent Labs Lab 01/05/15 0251 01/05/15 1428 01/06/15 0305  NA 156* 157* 154*  K 3.5 3.9 3.6  CL 122* 123* 123*  CO2 26 26 25   BUN 70* 64* 66*  CREATININE 1.86* 1.81* 1.91*  GLUCOSE 136* 169* 165*   Electrolytes  Recent Labs Lab 01/01/15 0323  01/03/15 0439 01/04/15 0257 01/05/15 0251 01/05/15 1428 01/06/15 0305  CALCIUM 8.9  < > 8.7* 8.5* 8.7* 8.8* 8.5*  MG 2.7*  < > 2.7* 2.7*  --  2.8*  --   PHOS 4.5  --  3.9 3.5  --   --   --   < > = values in this interval not displayed. Sepsis Markers No results for input(s): LATICACIDVEN, PROCALCITON, O2SATVEN  in the last 168 hours. ABG No results for input(s): PHART, PCO2ART, PO2ART in the last 168 hours. Liver Enzymes  Recent Labs Lab 12/31/14 0436 01/01/15 0323 01/03/15 0439  ALBUMIN 2.1* 2.2* 2.1*   Cardiac Enzymes  Recent Labs Lab 01/04/15 1840  TROPONINI 0.35*   Glucose  Recent Labs Lab 01/05/15 1616 01/05/15 2022 01/05/15 2348 01/06/15 0341 01/06/15 0438 01/06/15 0749  GLUCAP 139* 133* 134* 145* 127* 134*   Imaging Dg Abd Portable 1v  01/04/2015  CLINICAL DATA:  NG tube placement EXAM: PORTABLE ABDOMEN - 1 VIEW COMPARISON:  Abdominal radiograph from earlier today. FINDINGS: Tip of enteric tube is in the proximal stomach. The side port of the enteric tube is in the region of  the esophagogastric junction. No disproportionately dilated small bowel loops. Mild to moderate stool throughout the colon. No evidence of pneumatosis or pneumoperitoneum. IMPRESSION: Tip of enteric tube is in the proximal stomach, with the side-port in the region of the esophagogastric junction, consider advancing 5 cm. Electronically Signed   By: Ilona Sorrel M.D.   On: 01/04/2015 16:29   Dg Abd Portable 1v  01/04/2015  CLINICAL DATA:  Evaluate NG tube placement. EXAM: PORTABLE ABDOMEN - 1 VIEW COMPARISON:  Abdominal radiograph 01/04/2015. FINDINGS: Interval insertion of enteric tube with tip and side-port projecting over the stomach. Unchanged heterogeneous opacities bilateral lung bases. Nonobstructed bowel gas pattern. IMPRESSION: NG tube tip and side-port project over the stomach. Electronically Signed   By: Lovey Newcomer M.D.   On: 01/04/2015 12:57   Dg Abd Portable 1v  01/04/2015  CLINICAL DATA:  NG tube placement EXAM: PORTABLE ABDOMEN - 1 VIEW COMPARISON:  01/03/2015 abdominal radiograph FINDINGS: No enteric tube is visualized on this image. No dilated small bowel loops. Moderate stool throughout the visualized colon. No evidence of pneumatosis or pneumoperitoneum. Visualized median sternotomy wires are aligned and intact. Patchy lung opacities are noted at both lung bases. IMPRESSION: 1. No enteric tube is visualized on this image. Recommend repositioning/replacement of the enteric tube, which is likely coiled in the upper chest or neck. 2. Patchy bibasilar lung opacities, correlate with chest radiograph. These results were called by telephone at the time of interpretation on 01/04/2015 at 10:52 am to [place], who verbally acknowledged these results. Electronically Signed   By: Ilona Sorrel M.D.   On: 01/04/2015 10:53    CXR images from 11/20 personally reviewed> bilateral airspace disease    ASSESSMENT / PLAN:  STUDIES:   CT Head 11/7 - Left corona radiata infarct. No hemorrhage.  Old infarcts.  TTE 11/7 - LV normal in size. Mild LVH. EF 55-60%. LA normal in size. RA normal in size. RV normal in size and function. No pericardial effusion. 11/19 MRI brain > evolving acute to subacute left corona radiata/internal capsule infarct without further propagation; chronically occluded R internal carotid with old R MCA territory infarct.   LINES / TUBES: ETT 11/6 >> 11/18 Trach 11/18 >>   CULTURES: Tracheal Aspirate (11/6):  Oral Flora 11/18 resp >> Ecoli amp resistant  ANTIBIOTICS: Unasyn 11/6>>11/16 Ceftriaxone 11/20 >>   DISCUSSION: 81 y/o male with acute left internal capsule infarct with baseline old R MCA stroke admitted on 11/6 and developed confusion and respiratory failure from aspiration/ecoli pneumonia.  Tracheostomy placed 11/18, has been slow to wean since.  Not LTAC candidate until 21 days on ventilator have passed.   ASSESSMENT / PLAN:  PULMONARY A: Prolonged acute respiratory failure with hypoxemia Tracheostomy bleeding due to asa/plavix/sub  cutaneous heparin> no active bleeding today HCAP ? Acute pulmonary edema, exam supports P:   Continue full vent support Hold ASA/Plavix/Sub cutaneous heparin due to tracheostomy bleeding, discussed with Dr. Erlinda Hong Continue pulmonary toilette measures, guaifenesin Hold lasix given hypotension See ID  CARDIOVASCULAR A:  Intermittent VTach Labile BP 11/21-11/22> cuff pressures seem unreliable, lasix given yesterday but still volume up; doubt sepsis as WBC improving P:  Place A-line Check lactic acid Neosynephrine as needed to maintain MAP > 60 Avoid fluid boluses given anasarca  RENAL A:   Hypernatremia> improving with free water per NG tube Chronic kidney disease, Baseline Cr ~1.7-2.0 P:   Monitor BMET and UOP Replace electrolytes as needed  GASTROINTESTINAL A:   Protein calorie malnutrition  Dysphagia related to vent, stroke P:   PEG consult for Friday Continue NG tube  HEMATOLOGIC A:    Bleeding from tracheostomy P:  Hold ASA, Plavix, sub cutaneous heparin through 11/25 (PEG tube), discussed with neurology who agrees  INFECTIOUS A:   HCAP> E Coli P:   Continue ceftriaxone for 7 total days  ENDOCRINE A:   Hyperglycemia P:   Sub cutaneous insulin, sliding scale  NEUROLOGIC A:   Acute left internal capsule and corona radiata stroke > per neurology he may be able to ambulate with support with rehab. 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  Given his somewhat favorable neurology prognosis, it is reasonable to consider LTAC placement, though his overall prognosis is guarded given his multiple acute illnesses (HCAP, respiratory failure) and age.  The patient is critically ill with multiple organ systems failure and requires high complexity decision making for assessment and support, frequent evaluation and titration of therapies, application of advanced monitoring technologies and extensive interpretation of multiple databases. Critical Care Time devoted to patient care services described in this note independent of APP time is 40 minutes.   Roselie Awkward, MD [place] P[facility] Pager: [phone] Cell: [phone] After 3pm or if no response, call [phone]

## 2015-01-06 NOTE — Progress Notes (Signed)
SLP Cancellation Note  Patient Details Name: Darius Barber MRN: [id number] DOB: [birth year]   Cancelled treatment:       Reason Eval/Treat Not Completed: Medical issues which prohibited therapy- asked by Dr. Lake Bells to hold on inline PMV trials today.  Will f/u next date.   [name] 01/06/2015, 10:37 AM

## 2015-01-06 NOTE — Procedures (Signed)
Arterial Catheter Insertion Procedure Note Darius Barber [id number] 12/06/1931  Procedure: Insertion of Arterial Catheter  Indications: Blood pressure monitoring  Procedure Details Consent: Risks of procedure as well as the alternatives and risks of each were explained to the (patient/caregiver).  Consent for procedure obtained. Time Out: Verified patient identification, verified procedure, site/side was marked, verified correct patient position, special equipment/implants available, medications/allergies/relevent history reviewed, required imaging and test results available.  Performed  Maximum sterile technique was used including antiseptics, cap, gloves, gown, hand hygiene, mask and sheet. Skin prep: Chlorhexidine; local anesthetic administered 20 gauge catheter was inserted into right radial artery using the Seldinger technique.  Evaluation Blood flow good; BP tracing good. Complications: No apparent complications.   Jetty Peeks 01/06/2015

## 2015-01-06 NOTE — Progress Notes (Signed)
Patient ID: Darius Barber, male   DOB: 1931-12-18, 81 y.o.   MRN: [id number]   Percutaneous gastric tube request received  CT has been reviewed and approved for procedure per Dr Laurence Ferrari  Last dose Plavix was 11/21 Must be off 5 days to safely place G tube percutaneously  We will keep on round list and plan for G tube placement for next week  IR PA will see pt asap

## 2015-01-07 ENCOUNTER — Inpatient Hospital Stay (HOSPITAL_COMMUNITY): Payer: Medicare HMO

## 2015-01-07 DIAGNOSIS — I63412 Cerebral infarction due to embolism of left middle cerebral artery: Secondary | ICD-10-CM

## 2015-01-07 LAB — BASIC METABOLIC PANEL
Anion gap: 7 (ref 5–15)
BUN: 62 mg/dL — AB (ref 6–20)
CHLORIDE: 121 mmol/L — AB (ref 101–111)
CO2: 24 mmol/L (ref 22–32)
CREATININE: 1.89 mg/dL — AB (ref 0.61–1.24)
Calcium: 8.5 mg/dL — ABNORMAL LOW (ref 8.9–10.3)
GFR calc non Af Amer: 32 mL/min — ABNORMAL LOW (ref >60)
GFR, EST AFRICAN AMERICAN: 37 mL/min — AB (ref >60)
Glucose, Bld: 166 mg/dL — ABNORMAL HIGH (ref 65–99)
POTASSIUM: 3.5 mmol/L (ref 3.5–5.1)
SODIUM: 152 mmol/L — AB (ref 135–145)

## 2015-01-07 LAB — GLUCOSE, CAPILLARY
GLUCOSE-CAPILLARY: 161 mg/dL — AB (ref 65–99)
GLUCOSE-CAPILLARY: 108 mg/dL — AB (ref 65–99)
Glucose-Capillary: 137 mg/dL — ABNORMAL HIGH (ref 65–99)
Glucose-Capillary: 143 mg/dL — ABNORMAL HIGH (ref 65–99)
Glucose-Capillary: 147 mg/dL — ABNORMAL HIGH (ref 65–99)
Glucose-Capillary: 151 mg/dL — ABNORMAL HIGH (ref 65–99)
Glucose-Capillary: 163 mg/dL — ABNORMAL HIGH (ref 65–99)

## 2015-01-07 LAB — CBC WITH DIFFERENTIAL/PLATELET
Basophils Absolute: 0 10*3/uL (ref 0.0–0.1)
Basophils Relative: 0 %
EOS ABS: 0.3 10*3/uL (ref 0.0–0.7)
Eosinophils Relative: 3 %
HCT: 25.3 % — ABNORMAL LOW (ref 39.0–52.0)
HEMOGLOBIN: 7.6 g/dL — AB (ref 13.0–17.0)
LYMPHS ABS: 1.1 10*3/uL (ref 0.7–4.0)
LYMPHS PCT: 11 %
MCH: 28.9 pg (ref 26.0–34.0)
MCHC: 30 g/dL (ref 30.0–36.0)
MCV: 96.2 fL (ref 78.0–100.0)
MONOS PCT: 4 %
Monocytes Absolute: 0.4 10*3/uL (ref 0.1–1.0)
NEUTROS PCT: 82 %
Neutro Abs: 8 10*3/uL — ABNORMAL HIGH (ref 1.7–7.7)
Platelets: 272 10*3/uL (ref 150–400)
RBC: 2.63 MIL/uL — AB (ref 4.22–5.81)
RDW: 19.9 % — ABNORMAL HIGH (ref 11.5–15.5)
WBC: 9.7 10*3/uL (ref 4.0–10.5)

## 2015-01-07 MED ORDER — LABETALOL HCL 5 MG/ML IV SOLN
20.0000 mg | INTRAVENOUS | Status: DC | PRN
  Administered 2015-01-07 – 2015-01-14 (×5): 20 mg via INTRAVENOUS
  Filled 2015-01-07 (×5): qty 4

## 2015-01-07 MED ORDER — POTASSIUM CHLORIDE CRYS ER 20 MEQ PO TBCR
30.0000 meq | EXTENDED_RELEASE_TABLET | Freq: Three times a day (TID) | ORAL | Status: AC
  Administered 2015-01-07 (×2): 30 meq via ORAL
  Filled 2015-01-07 (×3): qty 1

## 2015-01-07 MED ORDER — ALBUTEROL SULFATE (2.5 MG/3ML) 0.083% IN NEBU
2.5000 mg | INHALATION_SOLUTION | RESPIRATORY_TRACT | Status: DC | PRN
  Administered 2015-01-07 – 2015-01-10 (×4): 2.5 mg via RESPIRATORY_TRACT
  Filled 2015-01-07 (×4): qty 3

## 2015-01-07 MED ORDER — FUROSEMIDE 10 MG/ML IJ SOLN
40.0000 mg | Freq: Three times a day (TID) | INTRAMUSCULAR | Status: AC
Start: 2015-01-07 — End: 2015-01-08
  Administered 2015-01-07 – 2015-01-08 (×3): 40 mg via INTRAVENOUS
  Filled 2015-01-07 (×3): qty 4

## 2015-01-07 NOTE — Progress Notes (Signed)
Patient placed on previous full support settings due to increase RR, BP, and WOB. RN aware. RT will continue to monitor.

## 2015-01-07 NOTE — Progress Notes (Signed)
SLP Cancellation Note  Patient Details Name: Darius Barber MRN: [id number] DOB: [birth year]   Cancelled treatment:       Reason Eval/Treat Not Completed: Medical issues which prohibited therapy. Per RN, MD does not want any manipulation of trach at this time. Will check back 11/25.   [name], CCC-SLP [phone]    [name] 01/07/2015, 8:46 AM

## 2015-01-07 NOTE — Progress Notes (Signed)
Patient ID: Darius Barber, male   DOB: 01/11/1932, 81 y.o.   MRN: [id number]   Perc G tube scheduled for possibly Mon 11/28 Last dose Plavix 11/21 Must hold 5 days Procedure not performed on weekend unless emergent  IR PA will see over weekend for consent and orders

## 2015-01-07 NOTE — Progress Notes (Signed)
PULMONARY  / [name]   Name: Darius Barber MRN: [id number] DOB: 02-14-1932    ADMISSION DATE:  12/21/2014 CONSULTATION DATE: 12/21/2014  REQUESTING CLINICIAN: Dr. Leonie Barber PRIMARY SERVICE: Neurology  CHIEF COMPLAINT:  Stroke  BRIEF PATIENT DESCRIPTION: 60 M transferred from [facility] after presenting with acute onset of right sided weakness and aphasia. Given tPA . He went to VIR on arrival with a normal angiogram. GCS 15 on arrival to NSICU but developed AMS with GCS of 7. PCCM consulted for intubation and vent management. prior CVA, CAD, carotid stenosis Course complicated by aspiration pneumonia & prolonged ventilation   SIGNIFICANT EVENTS: 11/6 - Admitted to hospital 11/14 weans on PS 12/5 11/18 bedside tracheostomy Titus Mould) 11/20 tracheostomy site bleeding 11/21 weaning on PSV 10/5, tracheostomy dressing changed, further bleeding, ASA, Plavix, sub cutaneous heparin held     SUBJECTIVE:  weaning on PSV 10/5, tracheostomy dressing changed, further bleeding, ASA, Plavix, sub cutaneous heparin held  VITAL SIGNS: Temp:  [98 F (36.7 C)-99.6 F (37.6 C)] 98 F (36.7 C) (11/23 0800) Pulse Rate:  [60-96] 85 (11/23 0800) Resp:  [21-31] 26 (11/23 0800) BP: (65-217)/(37-96) 130/67 mmHg (11/23 0800) SpO2:  [90 %-100 %] 93 % (11/23 0800) Arterial Line BP: (117-226)/(37-68) 184/57 mmHg (11/23 0800) FiO2 (%):  [50 %] 50 % (11/23 0800) Weight:  [97.4 kg (214 lb 11.7 oz)] 97.4 kg (214 lb 11.7 oz) (11/23 0400) HEMODYNAMICS:   VENTILATOR SETTINGS: Vent Mode:  [-] CPAP;PSV FiO2 (%):  [50 %] 50 % Set Rate:  [26 bmp] 26 bmp Vt Set:  [600 mL] 600 mL PEEP:  [5 cmH20] 5 cmH20 Pressure Support:  [10 cmH20] 10 cmH20 Plateau Pressure:  [22 cmH20-29 cmH20] 22 cmH20   INTAKE / OUTPUT: Intake/Output      11/22 0701 - 11/23 0700 11/23 0701 - 11/24 0700   I.V. (mL/kg) 480 (4.9) 20 (0.2)   NG/GT 3070 305   IV Piggyback 50    Total Intake(mL/kg) 3600  (37) 325 (3.3)   Urine (mL/kg/hr) 1390 (0.6)    Stool 0 (0)    Total Output 1390     Net +2210 +325        Urine Occurrence 2 x    Stool Occurrence 3 x     PHYSICAL EXAMINATION: General: Resting in bed, on vent, in NAD HENT: Trach bleeding controlled with mild oozing around site. No clot noted  PULM: Bilateral expiratory wheeze  CV: RRR, no mgr GI: BS+, distended, soft, nontender MSK: anasarca Neuro: Pt arouses to verbal stimulus. Follows simple commands.Flaccid in right arm, leg. Wiggles Rt toes. Moves L sided extremities on command  LABS:  CBC  Recent Labs Lab 01/05/15 0251 01/06/15 0305 01/07/15 0500  WBC 10.7* 8.9 9.7  HGB 8.4* 7.7* 7.6*  HCT 28.1* 26.0* 25.3*  PLT 320 286 272   Coag's  Recent Labs Lab 01/01/15 1454  APTT 44*  INR 1.27   BMET  Recent Labs Lab 01/05/15 1428 01/06/15 0305 01/07/15 0500  NA 157* 154* 152*  K 3.9 3.6 3.5  CL 123* 123* 121*  CO2 26 25 24   BUN 64* 66* 62*  CREATININE 1.81* 1.91* 1.89*  GLUCOSE 169* 165* 166*   Electrolytes  Recent Labs Lab 01/01/15 0323  01/03/15 0439 01/04/15 0257  01/05/15 1428 01/06/15 0305 01/07/15 0500  CALCIUM 8.9  < > 8.7* 8.5*  < > 8.8* 8.5* 8.5*  MG 2.7*  < > 2.7* 2.7*  --  2.8*  --   --  PHOS 4.5  --  3.9 3.5  --   --   --   --   < > = values in this interval not displayed. Sepsis Markers  Recent Labs Lab 01/06/15 1048  LATICACIDVEN 1.4   ABG No results for input(s): PHART, PCO2ART, PO2ART in the last 168 hours. Liver Enzymes  Recent Labs Lab 01/01/15 0323 01/03/15 0439  ALBUMIN 2.2* 2.1*   Cardiac Enzymes  Recent Labs Lab 01/04/15 1840  TROPONINI 0.35*   Glucose  Recent Labs Lab 01/06/15 1147 01/06/15 1537 01/06/15 2000 01/06/15 2353 01/07/15 0410 01/07/15 0817  GLUCAP 137* 121* 130* 163* 143* 151*   Imaging Dg Chest Port 1 View  01/07/2015  CLINICAL DATA:  Acute respiratory failure, hypoxemia EXAM: PORTABLE CHEST 1 VIEW COMPARISON:  Portable chest  x-ray of 12/2018 and 01/02/2015 FINDINGS: The lungs are not as well aerated. There is little change in diffuse airspace disease left-greater-than-right. Tracheostomy remains. There does appear to be a tube overlying the mid upper chest which could represent an NG tube but it does not appear to course below the hemidiaphragm. Clinical correlation is recommended. IMPRESSION: No change in diffuse airspace disease and poor aeration. Is there any NG tube present? See above. Electronically Signed   By: Ivar Drape M.D.   On: 01/07/2015 07:59      ASSESSMENT / PLAN:  STUDIES:   CT Head 11/7 - Left corona radiata infarct. No hemorrhage. Old infarcts.  TTE 11/7 - LV normal in size. Mild LVH. EF 55-60%. LA normal in size. RA normal in size. RV normal in size and function. No pericardial effusion. 11/19 MRI brain > evolving acute to subacute left corona radiata/internal capsule infarct without further propagation; chronically occluded R internal carotid with old R MCA territory infarct.   LINES / TUBES: ETT 11/6 >> 11/18 Trach 11/18 >>   CULTURES: Tracheal Aspirate (11/6):  Oral Flora 11/18 resp >> Ecoli amp resistant  ANTIBIOTICS: Unasyn 11/6>>11/16 Ceftriaxone 11/20 >>   DISCUSSION: 82 y/o male with acute left internal capsule infarct with baseline old R MCA stroke admitted on 11/6 and developed confusion and respiratory failure from aspiration/ecoli pneumonia.  Tracheostomy placed 11/18, has been slow to wean since.  Not LTAC candidate until 21 days on ventilator have passed.   ASSESSMENT / PLAN:  PULMONARY A: Prolonged acute respiratory failure with hypoxemia Tracheostomy bleeding due to asa/plavix/sub cutaneous heparin> no active bleeding 11/23  HCAP Pulmonary edema P:   Continue full vent support>Failed SBT 11/23  Hold ASA/Plavix/Sub cutaneous heparin due to tracheostomy bleeding, discussed with Dr. Erlinda Hong Continue pulmonary toilette measures, guaifenesin Lasix 40 mg x3  See  ID  CARDIOVASCULAR A:  Intermittent VTach Labile BP  P:  Neosynephrine as needed to maintain MAP > 60 Avoid fluid boluses given anasarca  RENAL A:   Hypernatremia> improving with free water per NG tube Chronic kidney disease, Baseline Cr ~1.7-2.0 P:   Continue free water  Monitor BMET and UOP Replace electrolytes as needed  GASTROINTESTINAL A:   Protein calorie malnutrition  Dysphagia related to vent, stroke P:   PEG consult for Friday Continue NG tube   HEMATOLOGIC A:   Bleeding from tracheostomy Anemia P:  Hold ASA, Plavix, sub cutaneous heparin through 11/25 (PEG tube) F/u CBC   INFECTIOUS A:   HCAP> E Coli P:   Continue ceftriaxone for 7 total days  ENDOCRINE A:   Hyperglycemia P:   Sub cutaneous insulin, sliding scale  NEUROLOGIC A:   Acute left internal  capsule and corona radiata stroke > per neurology he may be able to ambulate with support with rehab. 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  Given his somewhat favorable neurology prognosis, it is reasonable to consider LTAC placement, though his overall prognosis is guarded given his multiple acute illnesses (HCAP, respiratory failure) and age.

## 2015-01-07 NOTE — Progress Notes (Signed)
Dr. Titus Mould to bedside, removed dried clot from trach site and gave instructions to clean site and change inner cannula.  Will return in AM to remove rest of dried blood and place new surgicel.  No bleeding noted at this time.  Will continue to monitor.    Gus Rankin 01/07/2015 6:40 AM

## 2015-01-08 LAB — BASIC METABOLIC PANEL
ANION GAP: 7 (ref 5–15)
BUN: 68 mg/dL — ABNORMAL HIGH (ref 6–20)
CALCIUM: 8.3 mg/dL — AB (ref 8.9–10.3)
CO2: 25 mmol/L (ref 22–32)
CREATININE: 2.03 mg/dL — AB (ref 0.61–1.24)
Chloride: 117 mmol/L — ABNORMAL HIGH (ref 101–111)
GFR calc Af Amer: 34 mL/min — ABNORMAL LOW (ref >60)
GFR calc non Af Amer: 29 mL/min — ABNORMAL LOW (ref >60)
GLUCOSE: 139 mg/dL — AB (ref 65–99)
POTASSIUM: 3.7 mmol/L (ref 3.5–5.1)
Sodium: 149 mmol/L — ABNORMAL HIGH (ref 135–145)

## 2015-01-08 LAB — CBC
HEMATOCRIT: 23.7 % — AB (ref 39.0–52.0)
Hemoglobin: 6.9 g/dL — CL (ref 13.0–17.0)
MCH: 28 pg (ref 26.0–34.0)
MCHC: 29.1 g/dL — ABNORMAL LOW (ref 30.0–36.0)
MCV: 96.3 fL (ref 78.0–100.0)
PLATELETS: 256 10*3/uL (ref 150–400)
RBC: 2.46 MIL/uL — AB (ref 4.22–5.81)
RDW: 20.4 % — AB (ref 11.5–15.5)
WBC: 8.6 10*3/uL (ref 4.0–10.5)

## 2015-01-08 LAB — GLUCOSE, CAPILLARY
GLUCOSE-CAPILLARY: 125 mg/dL — AB (ref 65–99)
GLUCOSE-CAPILLARY: 131 mg/dL — AB (ref 65–99)
GLUCOSE-CAPILLARY: 136 mg/dL — AB (ref 65–99)
Glucose-Capillary: 146 mg/dL — ABNORMAL HIGH (ref 65–99)
Glucose-Capillary: 154 mg/dL — ABNORMAL HIGH (ref 65–99)

## 2015-01-08 LAB — VITAMIN B12: Vitamin B-12: 105 pg/mL — ABNORMAL LOW (ref 180–914)

## 2015-01-08 LAB — IRON AND TIBC
IRON: 36 ug/dL — AB (ref 45–182)
Saturation Ratios: 15 % — ABNORMAL LOW (ref 17.9–39.5)
TIBC: 248 ug/dL — ABNORMAL LOW (ref 250–450)
UIBC: 212 ug/dL

## 2015-01-08 LAB — RETICULOCYTES
RBC.: 2.64 MIL/uL — AB (ref 4.22–5.81)
RETIC COUNT ABSOLUTE: 116.2 10*3/uL (ref 19.0–186.0)
Retic Ct Pct: 4.4 % — ABNORMAL HIGH (ref 0.4–3.1)

## 2015-01-08 LAB — FERRITIN: FERRITIN: 141 ng/mL (ref 24–336)

## 2015-01-08 LAB — FOLATE: Folate: 20.7 ng/mL (ref >5.9)

## 2015-01-08 MED ORDER — LEVOTHYROXINE SODIUM 25 MCG PO TABS
150.0000 ug | ORAL_TABLET | Freq: Every day | ORAL | Status: DC
  Administered 2015-01-09 – 2015-01-15 (×7): 150 ug
  Filled 2015-01-08 (×7): qty 1

## 2015-01-08 MED ORDER — ACETAMINOPHEN 160 MG/5ML PO SOLN
650.0000 mg | Freq: Four times a day (QID) | ORAL | Status: DC | PRN
  Administered 2015-01-10 – 2015-01-14 (×4): 650 mg via ORAL
  Filled 2015-01-08 (×4): qty 20.3

## 2015-01-08 MED ORDER — FUROSEMIDE 10 MG/ML IJ SOLN
40.0000 mg | Freq: Once | INTRAMUSCULAR | Status: AC
  Administered 2015-01-08: 40 mg via INTRAVENOUS
  Filled 2015-01-08: qty 4

## 2015-01-08 NOTE — Progress Notes (Signed)
Spoke with CCM MD about HGB 6.9.  No new orders at this time.  Will continue to monitor pt.

## 2015-01-08 NOTE — Progress Notes (Signed)
PULMONARY  / CRITICAL CARE MEDICINE CONSULTATION   Name: Darius Barber MRN: [id number] DOB: Dec 17, 1931    ADMISSION DATE:  12/21/2014 CONSULTATION DATE: 12/21/2014  REQUESTING CLINICIAN: Dr. Leonie Man PRIMARY SERVICE: Neurology  CHIEF COMPLAINT:  Stroke  BRIEF PATIENT DESCRIPTION:  81 yo male from [place] with Rt sided weakness, and aphasia from CVA >> received tPA.  Developed progressive mental status change and intubated >> PCCM consulted.  SIGNIFICANT EVENTS: 11/06 Admitted to hospital 11/18 bedside tracheostomy Titus Mould) 11/20 tracheostomy site bleeding 11/21 Bleeding from trach >> hold ASA, plavix, SQ heparin 11/23 removed clot from tracheostomy  SUBJECTIVE:  No further bleeding from trach site  VITAL SIGNS: Temp:  [97.5 F (36.4 C)-99.3 F (37.4 C)] 97.5 F (36.4 C) (11/24 0752) Pulse Rate:  [60-83] 69 (11/24 0640) Resp:  [18-29] 29 (11/24 0640) BP: (61-171)/(39-94) 132/94 mmHg (11/24 0630) SpO2:  [89 %-100 %] 99 % (11/24 0731) Arterial Line BP: (104-199)/(38-74) 149/58 mmHg (11/24 0640) FiO2 (%):  [50 %-60 %] 50 % (11/24 0731) Weight:  [211 lb 3.2 oz (95.8 kg)] 211 lb 3.2 oz (95.8 kg) (11/24 0500)  VENTILATOR SETTINGS: Vent Mode:  [-] PRVC FiO2 (%):  [50 %-60 %] 50 % Set Rate:  [26 bmp] 26 bmp Vt Set:  [600 mL] 600 mL PEEP:  [5 cmH20] 5 cmH20 Plateau Pressure:  [27 cmH20-31 cmH20] 27 cmH20   INTAKE / OUTPUT: Intake/Output      11/23 0701 - 11/24 0700 11/24 0701 - 11/25 0700   I.V. (mL/kg) 480 (5)    NG/GT 2655    IV Piggyback 50    Total Intake(mL/kg) 3185 (33.2)    Urine (mL/kg/hr) 2975 (1.3)    Stool 0 (0)    Total Output 2975     Net +210          Stool Occurrence 3 x     PHYSICAL EXAMINATION: General: no distress HENT: trach site clean PULM: b/l crackles CV: regular GI: soft, non tender Ext: 2+ edema Neuro: Opens eyes with stimulation  LABS:  CBC  Recent Labs Lab 01/06/15 0305 01/07/15 0500 01/08/15 0520  WBC 8.9 9.7 8.6  HGB  7.7* 7.6* 6.9*  HCT 26.0* 25.3* 23.7*  PLT 286 272 256   Coag's  Recent Labs Lab 01/01/15 1454  APTT 44*  INR 1.27   BMET  Recent Labs Lab 01/06/15 0305 01/07/15 0500 01/08/15 0520  NA 154* 152* 149*  K 3.6 3.5 3.7  CL 123* 121* 117*  CO2 25 24 25   BUN 66* 62* 68*  CREATININE 1.91* 1.89* 2.03*  GLUCOSE 165* 166* 139*   Electrolytes  Recent Labs Lab 01/03/15 0439 01/04/15 0257  01/05/15 1428 01/06/15 0305 01/07/15 0500 01/08/15 0520  CALCIUM 8.7* 8.5*  < > 8.8* 8.5* 8.5* 8.3*  MG 2.7* 2.7*  --  2.8*  --   --   --   PHOS 3.9 3.5  --   --   --   --   --   < > = values in this interval not displayed.   Sepsis Markers  Recent Labs Lab 01/06/15 1048  LATICACIDVEN 1.4   Liver Enzymes  Recent Labs Lab 01/03/15 0439  ALBUMIN 2.1*   Cardiac Enzymes  Recent Labs Lab 01/04/15 1840  TROPONINI 0.35*   Glucose  Recent Labs Lab 01/07/15 1201 01/07/15 1548 01/07/15 1954 01/07/15 2334 01/08/15 0449 01/08/15 0750  GLUCAP 161* 108* 147* 137* 125* 131*   Imaging Dg Chest Port 1 View  01/07/2015  CLINICAL DATA:  Acute respiratory failure, hypoxemia EXAM: PORTABLE CHEST 1 VIEW COMPARISON:  Portable chest x-ray of 12/2018 and 01/02/2015 FINDINGS: The lungs are not as well aerated. There is little change in diffuse airspace disease left-greater-than-right. Tracheostomy remains. There does appear to be a tube overlying the mid upper chest which could represent an NG tube but it does not appear to course below the hemidiaphragm. Clinical correlation is recommended. IMPRESSION: No change in diffuse airspace disease and poor aeration. Is there any NG tube present? See above. Electronically Signed   By: Ivar Drape M.D.   On: 01/07/2015 07:59   Dg Abd Portable 1v  01/07/2015  CLINICAL DATA:  Enteric tube placement EXAM: PORTABLE ABDOMEN - 1 VIEW COMPARISON:  01/04/2015 abdominal radiograph FINDINGS: Enteric tube courses through the stomach into the duodenum and  terminates near the junction of the third and fourth portions of the duodenum. No dilated small bowel loops. Prominent colonic stool. No evidence of pneumatosis or pneumoperitoneum. Visualized sternotomy wires appear intact. IMPRESSION: Enteric tube terminates near the junction of the third and fourth portions of the duodenum. Electronically Signed   By: Ilona Sorrel M.D.   On: 01/07/2015 17:14    STUDIES:  CT Head 11/7 - Left corona radiata infarct. No hemorrhage. Old infarcts.  TTE 11/7 - LV normal in size. Mild LVH. EF 55-60%. LA normal in size. RA normal in size. RV normal in size and function. No pericardial effusion. 11/19 MRI brain > evolving acute to subacute left corona radiata/internal capsule infarct without further propagation; chronically occluded R internal carotid with old R MCA territory infarct.   LINES / TUBES: ETT 11/6 >> 11/18 Trach 11/18 >>  Rt radial aline 11/22 >>  CULTURES: Tracheal Aspirate (11/6):  Oral Flora 11/18 resp >> Ecoli amp resistant  ANTIBIOTICS: Unasyn 11/6>>11/16 Ceftriaxone 11/20 >>   DISCUSSION: 81 y/o male with acute left internal capsule infarct with baseline old R MCA stroke admitted on 11/6 and developed confusion and respiratory failure from aspiration/ecoli pneumonia.  Tracheostomy placed 11/18, has been slow to wean since.  Not LTAC candidate until 21 days on ventilator have passed.   ASSESSMENT / PLAN:  PULMONARY A: Acute respiratory failure in setting of CVA complicated by E coli HCAP, acute pulmonary edema. Failure to wean from vent s/p tracheostomy. Tracheostomy site bleeding >> resolved. P:   Pressure support wean as tolerated Diurese as tolerated  Hold ASA/Plavix/Sub cutaneous heparin due to tracheostomy bleeding  CARDIOVASCULAR A:  Intermittent VTach Labile BP >> cuff pressures do not seem to be accurate. Hx of HTN, HLD, CAD s/p CABG, PAD. P:  Neosynephrine as needed to maintain MAP > 60 Avoid fluid boluses given  anasarca  RENAL A:   Hypernatremia> improving. Chronic kidney disease, Baseline Cr ~1.7-2.0. P:   Continue free water  Monitor BMET and UOP Replace electrolytes as needed  GASTROINTESTINAL A:   Protein calorie malnutrition. Dysphagia related to vent, stroke. P:   PEG consult for 11/25 Protonix for SUP  HEMATOLOGIC A:   Bleeding from tracheostomy >> improved 11/24. Anemia from blood loss, and critical illness. P:  Hold ASA, Plavix, sub cutaneous heparin through 11/25 (PEG tube, trach bleeding) F/u CBC  Check iron levels  INFECTIOUS A:   HCAP> E Coli. P:   Day 5/7 Rocephin  ENDOCRINE A:   Hyperglycemia P:   Sub cutaneous insulin, sliding scale  NEUROLOGIC A:   Acute left internal capsule and corona radiata stroke > per neurology he may be able  to ambulate with support with rehab. Baseline old R MCA stroke. 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 P:   RASS goal: 0 PRN fentanyl   CC time 34 minutes.  Chesley Mires, MD [facility] Pulmonary/Critical Care 01/08/2015, 8:25 AM Pager:  [phone] After 3pm call: [phone]

## 2015-01-09 ENCOUNTER — Inpatient Hospital Stay (HOSPITAL_COMMUNITY): Payer: Medicare HMO

## 2015-01-09 DIAGNOSIS — N184 Chronic kidney disease, stage 4 (severe): Secondary | ICD-10-CM

## 2015-01-09 DIAGNOSIS — R1310 Dysphagia, unspecified: Secondary | ICD-10-CM | POA: Insufficient documentation

## 2015-01-09 DIAGNOSIS — J80 Acute respiratory distress syndrome: Secondary | ICD-10-CM

## 2015-01-09 LAB — BASIC METABOLIC PANEL
ANION GAP: 6 (ref 5–15)
BUN: 67 mg/dL — ABNORMAL HIGH (ref 6–20)
CHLORIDE: 113 mmol/L — AB (ref 101–111)
CO2: 26 mmol/L (ref 22–32)
Calcium: 7.9 mg/dL — ABNORMAL LOW (ref 8.9–10.3)
Creatinine, Ser: 1.87 mg/dL — ABNORMAL HIGH (ref 0.61–1.24)
GFR calc non Af Amer: 32 mL/min — ABNORMAL LOW (ref >60)
GFR, EST AFRICAN AMERICAN: 37 mL/min — AB (ref >60)
GLUCOSE: 166 mg/dL — AB (ref 65–99)
POTASSIUM: 3.8 mmol/L (ref 3.5–5.1)
Sodium: 145 mmol/L (ref 135–145)

## 2015-01-09 LAB — CBC
HEMATOCRIT: 24.9 % — AB (ref 39.0–52.0)
HEMOGLOBIN: 7.3 g/dL — AB (ref 13.0–17.0)
MCH: 28.3 pg (ref 26.0–34.0)
MCHC: 29.3 g/dL — ABNORMAL LOW (ref 30.0–36.0)
MCV: 96.5 fL (ref 78.0–100.0)
Platelets: 260 10*3/uL (ref 150–400)
RBC: 2.58 MIL/uL — ABNORMAL LOW (ref 4.22–5.81)
RDW: 20.8 % — ABNORMAL HIGH (ref 11.5–15.5)
WBC: 9.8 10*3/uL (ref 4.0–10.5)

## 2015-01-09 LAB — GLUCOSE, CAPILLARY
GLUCOSE-CAPILLARY: 136 mg/dL — AB (ref 65–99)
GLUCOSE-CAPILLARY: 139 mg/dL — AB (ref 65–99)
GLUCOSE-CAPILLARY: 117 mg/dL — AB (ref 65–99)
GLUCOSE-CAPILLARY: 155 mg/dL — AB (ref 65–99)
GLUCOSE-CAPILLARY: 114 mg/dL — AB (ref 65–99)
GLUCOSE-CAPILLARY: 126 mg/dL — AB (ref 65–99)
Glucose-Capillary: 150 mg/dL — ABNORMAL HIGH (ref 65–99)

## 2015-01-09 MED ORDER — FUROSEMIDE 10 MG/ML IJ SOLN: 40.0000 mg | Freq: Four times a day (QID) | INTRAMUSCULAR | Status: DC

## 2015-01-09 MED ORDER — FREE WATER
250.0000 mL | Freq: Four times a day (QID) | Status: DC
  Administered 2015-01-09 – 2015-01-11 (×7): 250 mL

## 2015-01-09 MED ORDER — FUROSEMIDE 10 MG/ML IJ SOLN
80.0000 mg | Freq: Four times a day (QID) | INTRAMUSCULAR | Status: AC
  Administered 2015-01-09: 80 mg via INTRAVENOUS
  Filled 2015-01-09: qty 8

## 2015-01-09 NOTE — Progress Notes (Signed)
PT Cancellation Note  Patient Details Name: ANVITH HARNISCH MRN: [id number] DOB: [birth year]   Cancelled Treatment:    Reason Eval/Treat Not Completed: Other (comment)  Spoke with RN who indicates there will be a family meeting this am to discuss [place].  Will f/u as appropriate.     Dasiah Hooley, [place] Papas 01/09/2015, 9:58 AM

## 2015-01-09 NOTE — Progress Notes (Signed)
PULMONARY  / CRITICAL CARE MEDICINE CONSULTATION   Name: ALFONZA BRIESE MRN: [id number] DOB: 1931/10/27    ADMISSION DATE:  12/21/2014 CONSULTATION DATE: 12/21/2014  REQUESTING CLINICIAN: Dr. Leonie Man PRIMARY SERVICE: Neurology  CHIEF COMPLAINT:  Stroke  BRIEF PATIENT DESCRIPTION:  81 yo male from [place] with Rt sided weakness, and aphasia from CVA >> received tPA.  Developed progressive mental status change and intubated >> PCCM consulted.  SIGNIFICANT EVENTS: 11/06 Admitted to hospital 11/18 bedside tracheostomy Titus Mould) 11/20 tracheostomy site bleeding 11/21 Bleeding from trach >> hold ASA, plavix, SQ heparin 11/23 removed clot from tracheostomy  SUBJECTIVE:  More awake and alert today No weaning attempts in the last two days  VITAL SIGNS: Temp:  [97.3 F (36.3 C)-99.3 F (37.4 C)] 99 F (37.2 C) (11/25 0700) Pulse Rate:  [59-86] 77 (11/25 0900) Resp:  [19-27] 26 (11/25 0900) BP: (81-206)/(48-80) 133/70 mmHg (11/25 0900) SpO2:  [92 %-100 %] 95 % (11/25 0900) Arterial Line BP: (112-222)/(41-75) 183/62 mmHg (11/25 0900) FiO2 (%):  [45 %] 45 % (11/25 0334) Weight:  [209 lb 3.5 oz (94.9 kg)] 209 lb 3.5 oz (94.9 kg) (11/25 0402)  VENTILATOR SETTINGS: Vent Mode:  [-] PRVC FiO2 (%):  [45 %] 45 % Set Rate:  [26 bmp] 26 bmp Vt Set:  [600 mL] 600 mL PEEP:  [5 cmH20] 5 cmH20 Plateau Pressure:  [21 cmH20-29 cmH20] 21 cmH20   INTAKE / OUTPUT: Intake/Output      11/24 0701 - 11/25 0700 11/25 0701 - 11/26 0700   I.V. (mL/kg) 480 (5.1) 40 (0.4)   NG/GT 1320 110   IV Piggyback 50    Total Intake(mL/kg) 1850 (19.5) 150 (1.6)   Urine (mL/kg/hr) 3075 (1.4) 200 (0.6)   Stool 0 (0)    Total Output 3075 200   Net -1225 -50        Urine Occurrence 1 x    Stool Occurrence 3 x     PHYSICAL EXAMINATION:  Gen: chronically ill appearing, on vent HENT: Trach in place, no bleeding PULM: Rhonchi bilaterally CV: RRR, no mgr GI: BS+, soft, nontender MSK: normal bulk and  tone Neuro: awake, follows simple commands, cannot answer higher level questions ("does a rock float on water?")   LABS:  CBC  Recent Labs Lab 01/07/15 0500 01/08/15 0520 01/09/15 0539  WBC 9.7 8.6 9.8  HGB 7.6* 6.9* 7.3*  HCT 25.3* 23.7* 24.9*  PLT 272 256 260   Coag's No results for input(s): APTT, INR in the last 168 hours. BMET  Recent Labs Lab 01/07/15 0500 01/08/15 0520 01/09/15 0539  NA 152* 149* 145  K 3.5 3.7 3.8  CL 121* 117* 113*  CO2 24 25 26   BUN 62* 68* 67*  CREATININE 1.89* 2.03* 1.87*  GLUCOSE 166* 139* 166*   Electrolytes  Recent Labs Lab 01/03/15 0439 01/04/15 0257  01/05/15 1428  01/07/15 0500 01/08/15 0520 01/09/15 0539  CALCIUM 8.7* 8.5*  < > 8.8*  < > 8.5* 8.3* 7.9*  MG 2.7* 2.7*  --  2.8*  --   --   --   --   PHOS 3.9 3.5  --   --   --   --   --   --   < > = values in this interval not displayed.   Sepsis Markers  Recent Labs Lab 01/06/15 1048  LATICACIDVEN 1.4   Liver Enzymes  Recent Labs Lab 01/03/15 0439  ALBUMIN 2.1*   Cardiac Enzymes  Recent Labs Lab 01/04/15  1840  TROPONINI 0.35*   Glucose  Recent Labs Lab 01/08/15 1133 01/08/15 1538 01/08/15 2004 01/08/15 2344 01/09/15 0354 01/09/15 0753  GLUCAP 136* 136* 146* 154* 150* 139*   Imaging Dg Chest Port 1 View  01/09/2015  CLINICAL DATA:  Pneumonia. EXAM: PORTABLE CHEST 1 VIEW COMPARISON:  01/07/2015. FINDINGS: Tracheostomy tube in stable position. Feeding tube noted with tip below left hemidiaphragm. Prior CABG. Stable cardiomegaly. Diffuse dense bilateral pulmonary infiltrates again noted. No interim change. No prominent pleural effusion. No pneumothorax. IMPRESSION: 1. Tracheostomy tube in stable position. Feeding tube noted with tip below left hemidiaphragm . 2.  Prior CABG.  Stable cardiomegaly. 3.  Persistent diffuse bilateral airspace disease.  No change. Electronically Signed   By: Marcello Moores  Register   On: 01/09/2015 07:21   Dg Abd Portable  1v  01/07/2015  CLINICAL DATA:  Enteric tube placement EXAM: PORTABLE ABDOMEN - 1 VIEW COMPARISON:  01/04/2015 abdominal radiograph FINDINGS: Enteric tube courses through the stomach into the duodenum and terminates near the junction of the third and fourth portions of the duodenum. No dilated small bowel loops. Prominent colonic stool. No evidence of pneumatosis or pneumoperitoneum. Visualized sternotomy wires appear intact. IMPRESSION: Enteric tube terminates near the junction of the third and fourth portions of the duodenum. Electronically Signed   By: Ilona Sorrel M.D.   On: 01/07/2015 17:14    STUDIES:  CT Head 11/7 - Left corona radiata infarct. No hemorrhage. Old infarcts.  TTE 11/7 - LV normal in size. Mild LVH. EF 55-60%. LA normal in size. RA normal in size. RV normal in size and function. No pericardial effusion. 11/19 MRI brain > evolving acute to subacute left corona radiata/internal capsule infarct without further propagation; chronically occluded R internal carotid with old R MCA territory infarct.   LINES / TUBES: ETT 11/6 >> 11/18 Trach 11/18 >>  Rt radial aline 11/22 >>11/25  CULTURES: Tracheal Aspirate (11/6):  Oral Flora 11/18 resp >> Ecoli amp resistant  ANTIBIOTICS: Unasyn 11/6>>11/16 Ceftriaxone 11/20 >> plan 7 days   DISCUSSION: 81 y/o male with acute left internal capsule infarct with baseline old R MCA stroke admitted on 11/6 and developed confusion and respiratory failure from aspiration/ecoli pneumonia.  Tracheostomy placed 11/18, has been slow to wean since.  Not LTAC candidate until 21 days on ventilator have passed.  Has ARDS, slow to improve.  ASSESSMENT / PLAN:  PULMONARY A: ARDS due to HCAP,  Possibly some acute pulmonary edema Failure to wean from vent s/p tracheostomy Tracheostomy site bleeding >> resolved P:   Pressure support wean as tolerated Diurese today, watch kidney function Hold ASA/Plavix/Sub cutaneous heparin due to tracheostomy  bleeding, could restart next week after PEG  CARDIOVASCULAR A:  Intermittent VTach Labile BP Hx of HTN, HLD, CAD s/p CABG, PAD P:  D/c arterial line Tele monitroing Prn labetalol  RENAL A:   Hypernatremia> improving > resolved Chronic kidney disease, Baseline Cr ~1.7-2.0 P:   Continue free water, decrease frequency Monitor BMET and UOP Replace electrolytes as needed  GASTROINTESTINAL A:   Protein calorie malnutrition Dysphagia related to vent, stroke P:   PEG consult for 11/28 Protonix for SUP  HEMATOLOGIC A:   Bleeding from tracheostomy >> improved 11/25 Anemia from blood loss, and critical illness P:  Hold ASA, Plavix, sub cutaneous heparin through 11/28 until after PEG F/u CBC   INFECTIOUS A:   HCAP> E Coli P:   Day 5/7 Rocephin  ENDOCRINE A:   Hyperglycemia P:   Sub  cutaneous insulin, sliding scale  NEUROLOGIC A:   Acute left internal capsule and corona radiata stroke > per neurology he may be able to ambulate with support with rehab Baseline old R MCA stroke 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  They voiced understanding and I answered many questions they had.  They will talk to him today about code status, going to LTAC.  CC time 90 minutes  Roselie Awkward, MD [place] [facility]M Pager: [phone] Cell: [phone] After 3pm or if no response, call [phone]

## 2015-01-09 NOTE — Progress Notes (Signed)
Placed patient back on full support at ordered respiratory rate on 15 BPM for the night dues to increased work of breathing.

## 2015-01-09 NOTE — Consult Note (Signed)
Chief Complaint: Patient was seen in consultation today for percutaneous gastric tube placement at the request of CCM  Referring Physician(s): Dr Lake Bells  History of Present Illness: Darius Barber is a 81 y.o. male   CVA Dysphagia Protein calorie malnutrition Vent/trach Las dose Plavix 11/21 CAD/CABG; carotid stent 2015 Need for long term care Request for percutaneous gastric tube placement per PCCM Imaging approved by Dr Laurence Ferrari Now scheduled for procedure 11/28 in IR  Past Medical History  Diagnosis Date  . Hyperlipidemia   . Colon cancer ([place])   . Essential hypertension   . Diverticulosis   . Coronary artery disease     CABG in 2006 LIMA- LAD, SVG-1D, SVG-OM1, SVG-PDA  . Carotid artery occlusion     Occluded RICA, remote left CEA, left carotid stent July 2015 - Dr. Trula Slade  . Hypothyroidism   . PAD (peripheral artery disease) ([place])   . Renal insufficiency, mild   . History of stroke     Past Surgical History  Procedure Laterality Date  . Coronary artery bypass graft  Oct 2006    [facility] - LIMA to LAD, SVG to D1, SVG to OM1, SVG to PDA  . Hemorrhoid surgery    . Hemicolectomy  2007    Dr.Matt Tsuei  . Carotid endarterectomy    . Arch aortogram N/A 08/06/2013    Procedure: ARCH AORTOGRAM;  Surgeon: Serafina Mitchell, MD;  Location: [facility] CATH LAB;  Service: Cardiovascular;  Laterality: N/A;  . Carotid angiogram N/A 08/06/2013    Procedure: CAROTID ANGIOGRAM;  Surgeon: Serafina Mitchell, MD;  Location: [facility] CATH LAB;  Service: Cardiovascular;  Laterality: N/A;  . Carotid stent insertion Left 08/20/2013    Procedure: CAROTID STENT INSERTION;  Surgeon: Serafina Mitchell, MD;  Location: [facility] CATH LAB;  Service: Cardiovascular;  Laterality: Left;  . Radiology with anesthesia N/A 12/21/2014    Procedure: RADIOLOGY WITH ANESTHESIA;  Surgeon: Luanne Bras, MD;  Location: [facility];  Service: Radiology;  Laterality: N/A;    Allergies: Simvastatin  Medications: Prior to  Admission medications   Medication Sig Start Date End Date Taking? Authorizing Provider  ALPRAZolam (XANAX) 0.25 MG tablet Take 0.25 mg by mouth at bedtime as needed.   Yes Historical Provider, MD  aspirin (ASPIRIN EC) 81 MG EC tablet Take 81 mg by mouth daily. Swallow whole.   Yes Historical Provider, MD  atenolol (TENORMIN) 25 MG tablet Take 1 tablet (25 mg total) by mouth daily. 11/12/14  Yes Carlena Bjornstad, MD  atorvastatin (LIPITOR) 80 MG tablet Take 80 mg by mouth daily.   Yes Historical Provider, MD  clopidogrel (PLAVIX) 75 MG tablet take 1 tablet by mouth once daily 07/21/14  Yes Serafina Mitchell, MD  furosemide (LASIX) 40 MG tablet Take 40 mg by mouth. Take one tab every morning & 1/2 tab around 2 pm 06/05/13  Yes Historical Provider, MD  iron polysaccharides (NIFEREX) 150 MG capsule Take 150 mg by mouth daily.   Yes Historical Provider, MD  levothyroxine (SYNTHROID, LEVOTHROID) 150 MCG tablet Take 150 mcg by mouth every morning.   Yes Historical Provider, MD  losartan (COZAAR) 50 MG tablet Take 50 mg by mouth daily.   Yes Historical Provider, MD  NIFEdipine (PROCARDIA XL/ADALAT-CC) 60 MG 24 hr tablet Take 60 mg by mouth daily.   Yes Historical Provider, MD  sertraline (ZOLOFT) 100 MG tablet Take 100 mg by mouth daily.   Yes Historical Provider, MD  traZODone (DESYREL) 150 MG tablet  Take 150 mg by mouth at bedtime.   Yes Historical Provider, MD  testosterone cypionate (DEPOTESTOTERONE CYPIONATE) 200 MG/ML injection Inject 100 mg into the muscle every 14 (fourteen) days.  04/22/13   Historical Provider, MD     No family history on file.  Social History   Social History  . Marital Status: Married    Spouse Name: N/A  . Number of Children: N/A  . Years of Education: N/A   Social History Main Topics  . Smoking status: Former Smoker    Types: Cigarettes    Quit date: 10/24/1979  . Smokeless tobacco: Never Used  . Alcohol Use: No  . Drug Use: No  . Sexual Activity: Not Asked   Other  Topics Concern  . None   Social History Narrative    Review of Systems: A 12 point ROS discussed and pertinent positives are indicated in the HPI above.  All other systems are negative.  Review of Systems  Respiratory: Positive for shortness of breath and wheezing.        Vent/trach  Psychiatric/Behavioral: Positive for agitation.    Vital Signs: BP 98/57 mmHg  Pulse 66  Temp(Src) 98.7 F (37.1 C) (Oral)  Resp 17  Ht 5\' 9"  (1.753 m)  Wt 209 lb 3.5 oz (94.9 kg)  BMI 30.88 kg/m2  SpO2 95%  Physical Exam  Cardiovascular: Normal rate and regular rhythm.   Pulmonary/Chest: He has wheezes.  vent  Abdominal: Soft. He exhibits distension.  Skin: Skin is warm and dry.  Psychiatric:  Consented wife at bedside  Nursing note and vitals reviewed.   Mallampati Score:  MD Evaluation Airway: WNL Heart: WNL Abdomen: WNL Chest/ Lungs: WNL ASA  Classification: 3 Mallampati/Airway Score: Three  Imaging: Ct Abdomen Wo Contrast  12/21/2014  CLINICAL DATA:  TPA.  Concern for bleeding. EXAM: CT CHEST, ABDOMEN AND PELVIS WITHOUT CONTRAST TECHNIQUE: Multidetector CT imaging of the chest, abdomen and pelvis was performed following the standard protocol without IV contrast. COMPARISON:  None. FINDINGS: CT CHEST FINDINGS There is confluent consolidation with air bronchograms in the posterior lower lobes. This could represent infectious infiltrate. Ground-glass opacities are scattered elsewhere, and there also is interlobular septal thickening. This could represent interstitial and alveolar edema. Trace pleural effusions. Central airways are patent. No mediastinal hemorrhage. CT ABDOMEN AND PELVIS FINDINGS No peritoneal blood or free air. There are unremarkable unenhanced appearances of the liver, spleen, pancreas, adrenals and kidneys. There is contrast material in the renal collecting systems and ureters due to earlier contrast administration. The abdominal aorta is normal in caliber with  moderate atherosclerotic calcification. Bowel is unremarkable. IMPRESSION: No evidence of significant hemorrhage within the chest, abdomen or pelvis. Confluent airspace consolidation in the posterior lower lobes. This could represent infectious infiltrate. Interstitial and alveolar edema is probably pulse present bilaterally. Electronically Signed   By: Andreas Newport M.D.   On: 12/21/2014 03:53   Ct Head Wo Contrast  12/22/2014  CLINICAL DATA:  Follow-up acute stroke. History of hypertension, hyperlipidemia, colon cancer. EXAM: CT HEAD WITHOUT CONTRAST TECHNIQUE: Contiguous axial images were obtained from the base of the skull through the vertex without intravenous contrast. COMPARISON:  MRI of the brain December 21, 2014 FINDINGS: No intraparenchymal hemorrhage, mass effect, midline shift or acute large vascular territory infarct. Patient's known acute LEFT corona radiata infarct is confluent with supratentorial white matter hypodensities. RIGHT frontal infarct extending to the insula and operculum is unchanged with mild ex vacuo dilatation RIGHT lateral ventricle. Moderate to severe  ventriculomegaly with disproportionate sulcal effacement at the convexities. Old LEFT basal ganglia infarcts. No abnormal extra-axial fluid collections. Basal cisterns are patent. Status post bilateral ocular lens implants. Poor dentition partially imaged. Paranasal sinus mucosal thickening without air-fluid levels. The mastoid air cells are well aerated. No skull fracture. Small vertex scalp hematoma. IMPRESSION: Patient's known acute LEFT corona radiata infarct is confluent with chronic small vessel ischemic disease and not discretely identified. No hemorrhagic conversion. Chronic change including old RIGHT MCA territory infarct and LEFT basal ganglia lacunar infarcts. Findings of normal pressure hydrocephalus are similar. Electronically Signed   By: Elon Alas M.D.   On: 12/22/2014 02:53   Ct Head Wo  Contrast  12/21/2014  CLINICAL DATA:  Follow-up code stroke, altered mental status. Acute onset RIGHT-sided weakness and aphasia. History of occluded RIGHT internal carotid artery and 90% stenosis LEFT internal carotid artery. EXAM: CT HEAD WITHOUT CONTRAST TECHNIQUE: Contiguous axial images were obtained from the base of the skull through the vertex without intravenous contrast. COMPARISON:  CT head December 20, 2014 FINDINGS: Small amount of intravascular contrast compatible with recent procedure. No intraparenchymal hemorrhage, mass effect, midline shift. RIGHT frontoparietal encephalomalacia with extension to RIGHT basal ganglia. General preservation of gray-white matter differentiation. Moderate to severe ventriculomegaly, with disproportionate sulcal effacement at the convexities, unchanged. Confluent supratentorial white matter hypodensities are similar compatible with chronic small vessel ischemic disease. Old LEFT basal ganglia lacunar infarcts. RIGHT cerebral peduncle volume loss compatible with wallerian degeneration. No abnormal extra-axial fluid collection. Basal cisterns are patent. Status post bilateral ocular lens implants. Poor dentition with RIGHT maxillary dental carie. Mild to moderate paranasal sinusitis. The mastoid air cells are well aerated. No skull fracture. Life-support lines in place. IMPRESSION: No acute intracranial process.  Old RIGHT Chronic changes including old RIGHT MCA territory infarct and old LEFT basal ganglia lacunar infarcts. Findings of normal pressure hydrocephalus are similar. Electronically Signed   By: Elon Alas M.D.   On: 12/21/2014 03:37   Ct Chest Wo Contrast  12/21/2014  CLINICAL DATA:  TPA.  Concern for bleeding. EXAM: CT CHEST, ABDOMEN AND PELVIS WITHOUT CONTRAST TECHNIQUE: Multidetector CT imaging of the chest, abdomen and pelvis was performed following the standard protocol without IV contrast. COMPARISON:  None. FINDINGS: CT CHEST FINDINGS There is  confluent consolidation with air bronchograms in the posterior lower lobes. This could represent infectious infiltrate. Ground-glass opacities are scattered elsewhere, and there also is interlobular septal thickening. This could represent interstitial and alveolar edema. Trace pleural effusions. Central airways are patent. No mediastinal hemorrhage. CT ABDOMEN AND PELVIS FINDINGS No peritoneal blood or free air. There are unremarkable unenhanced appearances of the liver, spleen, pancreas, adrenals and kidneys. There is contrast material in the renal collecting systems and ureters due to earlier contrast administration. The abdominal aorta is normal in caliber with moderate atherosclerotic calcification. Bowel is unremarkable. IMPRESSION: No evidence of significant hemorrhage within the chest, abdomen or pelvis. Confluent airspace consolidation in the posterior lower lobes. This could represent infectious infiltrate. Interstitial and alveolar edema is probably pulse present bilaterally. Electronically Signed   By: Andreas Newport M.D.   On: 12/21/2014 03:53   Mr Brain Wo Contrast  01/03/2015  CLINICAL DATA:  Tracheostomy yesterday, unable to wean from vent. Lethargy. History of hypertension, hyperlipidemia, stroke, mild renal insufficiency, carotid artery occlusion. EXAM: MRI HEAD WITHOUT CONTRAST TECHNIQUE: Multiplanar, multiecho pulse sequences of the brain and surrounding structures were obtained without intravenous contrast. COMPARISON:  MRI of the head December 21, 2014 FINDINGS:  LEFT corona radiata to internal capsule reduced diffusion with normalizing ADC values, at site of prior infarct. No new areas of acute ischemia. No susceptibility artifact to suggest hemorrhage. Old LEFT basal ganglia lacunar infarcts. Moderate to severe ventriculomegaly, predominately involving the third and lateral ventricles with disproportionate sulcal effacement at the convexities. RIGHT frontal and temporal encephalomalacia  extending to the basal ganglia, with insular involvement. Patchy to confluent supratentorial white matter FLAIR T2 hyperintensities. No midline shift, mass effect or mass lesions. No abnormal extra-axial fluid collections. Chronically occluded RIGHT internal carotid artery with loss of flow void. Status post bilateral ocular lens implants. Moderate paranasal sinus mucosal thickening. Moderate mastoid effusions. No abnormal sellar expansion. No cerebellar tonsillar ectopia. No suspicious calvarial bone marrow signal. IMPRESSION: Evolving, acute to subacute LEFT corona radiata/ internal capsule infarct without further propagation or new areas of ischemia. Chronically occluded RIGHT internal carotid artery, with old RIGHT MCA territory infarct. Additional chronic changes including probable normal pressure hydrocephalus. Electronically Signed   By: Elon Alas M.D.   On: 01/03/2015 22:46   Mr Brain Wo Contrast  12/21/2014  CLINICAL DATA:  81 year old male status post endovascular treatment of code stroke at 0145 hours today. Initial encounter. EXAM: MRI HEAD WITHOUT CONTRAST TECHNIQUE: Multiplanar, multiecho pulse sequences of the brain and surrounding structures were obtained without intravenous contrast. COMPARISON:  Head CT 0414 hours today. Head CT 12/20/2014. Brain MRI 03/05/2014. FINDINGS: Major intracranial vascular flow voids are stable, chronic occlusion of the right ICA siphon. Flow in the left siphon appears stable. Dominant distal left vertebral artery, proximal posterior circulation appears stable. 2.5 cm area of restricted diffusion in the left corona radiata (series 3 image 32.). T2 and FLAIR hyperintensity. No associated hemorrhage or mass effect. Mildly heterogeneous diffusion signal elsewhere, no definite additional restricted diffusion. Chronic right MCA encephalomalacia. Chronic cerebral volume loss. Chronic Patchy and confluent bilateral cerebral white matter T2 and FLAIR hyperintensity.  Chronic lacunar infarcts in the basal ganglia. Outside of the acute findings, no new signal abnormality. Occasional chronic micro hemorrhages. No midline shift, mass effect, evidence of mass lesion, ventriculomegaly, extra-axial collection or acute intracranial hemorrhage. Cervicomedullary junction and pituitary are within normal limits. Negative visualized cervical spine. Fluid in the pharynx. Trace left mastoid fluid is not significantly changed. Paranasal sinus mucosal thickening and small fluid levels. Stable orbits soft tissues. Scalp hematoma at the vertex (series 5, image 13). Visualized bone marrow signal is within normal limits. IMPRESSION: 1. Left corona radiata 2.5 cm infarct without hemorrhage or mass effect. 2. No other acute intracranial abnormality. Chronic ischemic disease. Chronic right ICA occlusion. 3. Vertex scalp hematoma. Electronically Signed   By: Genevie Ann M.D.   On: 12/21/2014 14:59   Dg Chest Port 1 View  01/09/2015  CLINICAL DATA:  Pneumonia. EXAM: PORTABLE CHEST 1 VIEW COMPARISON:  01/07/2015. FINDINGS: Tracheostomy tube in stable position. Feeding tube noted with tip below left hemidiaphragm. Prior CABG. Stable cardiomegaly. Diffuse dense bilateral pulmonary infiltrates again noted. No interim change. No prominent pleural effusion. No pneumothorax. IMPRESSION: 1. Tracheostomy tube in stable position. Feeding tube noted with tip below left hemidiaphragm . 2.  Prior CABG.  Stable cardiomegaly. 3.  Persistent diffuse bilateral airspace disease.  No change. Electronically Signed   By: Marcello Moores  Register   On: 01/09/2015 07:21   Dg Chest Port 1 View  01/07/2015  CLINICAL DATA:  Acute respiratory failure, hypoxemia EXAM: PORTABLE CHEST 1 VIEW COMPARISON:  Portable chest x-ray of 12/2018 and 01/02/2015 FINDINGS: The lungs are  not as well aerated. There is little change in diffuse airspace disease left-greater-than-right. Tracheostomy remains. There does appear to be a tube overlying the  mid upper chest which could represent an NG tube but it does not appear to course below the hemidiaphragm. Clinical correlation is recommended. IMPRESSION: No change in diffuse airspace disease and poor aeration. Is there any NG tube present? See above. Electronically Signed   By: Ivar Drape M.D.   On: 01/07/2015 07:59   Dg Chest Port 1 View  01/04/2015  CLINICAL DATA:  Acute respiratory failure EXAM: PORTABLE CHEST 1 VIEW COMPARISON:  01/02/2015 FINDINGS: Tracheostomy remains in good position.  No pneumothorax Severe diffuse bilateral airspace disease shows mild progression. No significant effusion. Bibasilar atelectasis IMPRESSION: Severe bilateral airspace disease with mild progression from the prior study. Electronically Signed   By: Franchot Gallo M.D.   On: 01/04/2015 09:04   Dg Chest Port 1 View  01/02/2015  CLINICAL DATA:  Status post tracheostomy tube placement today. EXAM: PORTABLE CHEST 1 VIEW COMPARISON:  Single view of the chest 01/01/2015. FINDINGS: The patient has a new tracheostomy tube in place with the tip projecting good position at the level of the clavicular heads. NG tube has been removed. Diffuse bilateral airspace disease persists without marked change. Heart size is enlarged. The patient is status post CABG. IMPRESSION: Tracheostomy tube projects in good position. No change in diffuse bilateral airspace disease. Electronically Signed   By: Inge Rise M.D.   On: 01/02/2015 15:29   Dg Chest Port 1 View  01/01/2015  CLINICAL DATA:  Acute respiratory failure with hypoxemia. EXAM: PORTABLE CHEST 1 VIEW COMPARISON:  12/31/2014 FINDINGS: Endotracheal tube is in place with tip 6.8 cm above carina. Nasogastric tube is in place, tip beyond the film beyond the gastroesophageal junction. Heart is enlarged. There patchy infiltrates throughout the lungs bilaterally, unchanged. IMPRESSION: Stable appearance of endotracheal tube and nasogastric tube. Persistent bilateral pulmonary  infiltrates. Electronically Signed   By: Nolon Nations M.D.   On: 01/01/2015 08:47   Dg Chest Port 1 View  12/31/2014  CLINICAL DATA:  81 year old male with acute respiratory failure, hypoxia. Initial encounter. EXAM: PORTABLE CHEST 1 VIEW COMPARISON:  12/30/2014 and earlier. FINDINGS: Portable AP semi upright view at 0853 hours. Extubated. Enteric tube remains in place. Interval lower lung volumes and increased confluent bilateral pulmonary opacity worse on the left. No large pleural effusion. No pneumothorax. Stable cardiac size and mediastinal contours. Sequelae of CABG and probable left thyroid resection. IMPRESSION: 1. Extubated with lower lung volumes and increased confluent bilateral pulmonary opacity which could be related to bilateral pneumonia, edema, ARDS. 2. Enteric tube remains. Electronically Signed   By: Genevie Ann M.D.   On: 12/31/2014 09:07   Dg Chest Port 1 View  12/30/2014  CLINICAL DATA:  Acute respiratory failure, hypoxemia EXAM: PORTABLE CHEST 1 VIEW COMPARISON:  Portable chest x-ray of 12/29/2014, CT chest of 12/21/2014 and portable chest x-ray of 12/20/2014 FINDINGS: Airspace disease remains throughout the lungs bilaterally although there is more opacification in the left upper lobe. In view of the development of this left upper lobe opacity since the chest x-ray of 11 /5, this is most consistent with an inflammatory or infectious process versus hemorrhage. Cardiomegaly is stable. The endotracheal tube is unchanged in position. and NG tube is noted. IMPRESSION: Bilateral airspace disease greatest in the left upper low suspicious for an infectious, inflammatory, or possibly hemorrhagic process. Electronically Signed   By: Windy Canny.D.  On: 12/30/2014 08:06   Dg Chest Port 1 View  12/29/2014  CLINICAL DATA:  Respiratory distress. Intubated patient. Subsequent encounter. EXAM: PORTABLE CHEST 1 VIEW COMPARISON:  12/28/2014 FINDINGS: Allowing for slight differences in lung volume  and patient positioning, there has been no significant change in the bilateral airspace lung opacities. Lung volumes remain low. Cardiac silhouette is normal in size. No mediastinal or hilar masses. No pneumothorax. Endotracheal tube and orogastric tube are stable and well positioned. IMPRESSION: 1. No change from the previous day's study. 2. Persistent bilateral airspace lung opacities. 3. Support apparatus is stable and well positioned. Electronically Signed   By: Lajean Manes M.D.   On: 12/29/2014 08:00   Dg Chest Port 1 View  12/28/2014  CLINICAL DATA:  Respiratory failure.  Subsequent encounter. EXAM: PORTABLE CHEST 1 VIEW COMPARISON:  12/27/2014 FINDINGS: Bilateral patchy areas of airspace opacity are without significant change from the previous day's study. Lung volumes remain low. Changes from CABG surgery are stable. Endotracheal tube and orogastric tube are stable and well positioned. No pneumothorax. IMPRESSION: 1. No change from the previous day's study 2. Persistent bilateral airspace opacities most likely due to multifocal pneumonia. Asymmetric edema is possible. 3. Support apparatus is stable and well positioned. Electronically Signed   By: Lajean Manes M.D.   On: 12/28/2014 08:23   Dg Chest Port 1 View  12/27/2014  CLINICAL DATA:  Aspiration pneumonia EXAM: PORTABLE CHEST 1 VIEW COMPARISON:  12/26/2014 FINDINGS: Endotracheal tube terminates 2 cm above the carina. Multifocal patchy opacities, left upper lobe predominant, increased. No pleural effusion or pneumothorax. Cardiomegaly. Postsurgical changes related to prior CABG. Median sternotomy. Enteric tube courses into the stomach. IMPRESSION: Endotracheal tube terminates 2 cm above the carina. Multifocal patchy opacities, left upper lobe predominant, increased. Overall appearance suggests worsening multifocal pneumonia, possibly on the basis of aspiration given the clinical history. Electronically Signed   By: Julian Hy M.D.   On:  12/27/2014 09:02   Dg Chest Port 1 View  12/26/2014  CLINICAL DATA:  Evaluate course of aspiration pneumonia. intubated. NG tube reposition. EXAM: PORTABLE CHEST 1 VIEW COMPARISON:  12/25/2014 FINDINGS: NG tube has been advanced into the proximal stomach. Endotracheal soon is 5 cm above the carina. Mild cardiomegaly. Vascular congestion. Patchy bilateral airspace opacities are slightly improved since prior study. No visible effusions. IMPRESSION: Endotracheal tube in stable position. A NG tube advanced into the proximal stomach. Slight improvement in patchy bilateral airspace disease and lung volumes. Electronically Signed   By: Rolm Baptise M.D.   On: 12/26/2014 11:59   Dg Chest Port 1 View  12/25/2014  CLINICAL DATA:  Aspiration pneumonia EXAM: PORTABLE CHEST 1 VIEW COMPARISON:  12/24/2014 FINDINGS: Endotracheal tube terminates 3 cm above the carina. Low lung volumes. Multifocal patchy opacities. Possible small right pleural effusion. No pneumothorax. The heart is top-normal in size. Postsurgical changes related to prior CABG. Median sternotomy. IMPRESSION: Endotracheal tube terminates 3 cm above the carina. Low lung volumes. Multifocal patchy opacities, suspicious for multifocal pneumonia, possibly related to aspiration. Electronically Signed   By: Julian Hy M.D.   On: 12/25/2014 10:21   Dg Chest Port 1 View  12/24/2014  CLINICAL DATA:  Respiratory distress, patient on ventilator. Respiratory failure. EXAM: PORTABLE CHEST 1 VIEW COMPARISON:  12/22/2014 and CT chest 12/21/2014. FINDINGS: Endotracheal tube terminates 2.7 cm above the carina. Nasogastric tube is followed into the stomach. Heart size is grossly stable. Lungs are low in volume with worsening bilateral airspace opacification. No definite  pleural fluid. IMPRESSION: Worsening bilateral airspace opacification may be due to edema. Pneumonia is not excluded. Electronically Signed   By: Lorin Picket M.D.   On: 12/24/2014 08:17   Dg  Chest Port 1 View  12/22/2014  CLINICAL DATA:  Aspiration pneumonia EXAM: PORTABLE CHEST 1 VIEW COMPARISON:  CT scan of the chest and chest x-ray of December 21, 2014 FINDINGS: The lung volumes remain low. The interstitial markings remain increased. There is no pleural effusion or pneumothorax. The cardiac silhouette remains enlarged. There are stable post CABG changes. The pulmonary vascularity remains engorged but is less conspicuous today. Patchy alveolar opacities are also less conspicuous. The endotracheal tube tip projects 4.4 cm above the carina. The PICC line on the adenoma to some blood IMPRESSION: Bilateral pulmonary hypoinflation with persistently increased interstitial infiltrates or edema exhibiting some improvement. There is is decreased pulmonary vascular prominence. Stable cardiomegaly. The support tubes are in reasonable position. Electronically Signed   By: David  Martinique M.D.   On: 12/22/2014 07:45   Dg Chest Port 1 View  12/21/2014  CLINICAL DATA:  Respiratory failure EXAM: PORTABLE CHEST 1 VIEW COMPARISON:  12/20/2014 FINDINGS: Endotracheal tube tip is just below the clavicular heads. Mild central vascular prominence, unchanged. No dense airspace consolidation. No large effusion. No pneumothorax. IMPRESSION: Satisfactory ET tube position. No other significant interval change. Electronically Signed   By: Andreas Newport M.D.   On: 12/21/2014 02:55   Dg Abd Portable 1v  01/07/2015  CLINICAL DATA:  Enteric tube placement EXAM: PORTABLE ABDOMEN - 1 VIEW COMPARISON:  01/04/2015 abdominal radiograph FINDINGS: Enteric tube courses through the stomach into the duodenum and terminates near the junction of the third and fourth portions of the duodenum. No dilated small bowel loops. Prominent colonic stool. No evidence of pneumatosis or pneumoperitoneum. Visualized sternotomy wires appear intact. IMPRESSION: Enteric tube terminates near the junction of the third and fourth portions of the duodenum.  Electronically Signed   By: Ilona Sorrel M.D.   On: 01/07/2015 17:14   Dg Abd Portable 1v  01/04/2015  CLINICAL DATA:  NG tube placement EXAM: PORTABLE ABDOMEN - 1 VIEW COMPARISON:  Abdominal radiograph from earlier today. FINDINGS: Tip of enteric tube is in the proximal stomach. The side port of the enteric tube is in the region of the esophagogastric junction. No disproportionately dilated small bowel loops. Mild to moderate stool throughout the colon. No evidence of pneumatosis or pneumoperitoneum. IMPRESSION: Tip of enteric tube is in the proximal stomach, with the side-port in the region of the esophagogastric junction, consider advancing 5 cm. Electronically Signed   By: Ilona Sorrel M.D.   On: 01/04/2015 16:29   Dg Abd Portable 1v  01/04/2015  CLINICAL DATA:  Evaluate NG tube placement. EXAM: PORTABLE ABDOMEN - 1 VIEW COMPARISON:  Abdominal radiograph 01/04/2015. FINDINGS: Interval insertion of enteric tube with tip and side-port projecting over the stomach. Unchanged heterogeneous opacities bilateral lung bases. Nonobstructed bowel gas pattern. IMPRESSION: NG tube tip and side-port project over the stomach. Electronically Signed   By: Lovey Newcomer M.D.   On: 01/04/2015 12:57   Dg Abd Portable 1v  01/04/2015  CLINICAL DATA:  NG tube placement EXAM: PORTABLE ABDOMEN - 1 VIEW COMPARISON:  01/03/2015 abdominal radiograph FINDINGS: No enteric tube is visualized on this image. No dilated small bowel loops. Moderate stool throughout the visualized colon. No evidence of pneumatosis or pneumoperitoneum. Visualized median sternotomy wires are aligned and intact. Patchy lung opacities are noted at both lung bases. IMPRESSION:  1. No enteric tube is visualized on this image. Recommend repositioning/replacement of the enteric tube, which is likely coiled in the upper chest or neck. 2. Patchy bibasilar lung opacities, correlate with chest radiograph. These results were called by telephone at the time of  interpretation on 01/04/2015 at 10:52 am to [place], who verbally acknowledged these results. Electronically Signed   By: Ilona Sorrel M.D.   On: 01/04/2015 10:53   Dg Abd Portable 1v  01/03/2015  CLINICAL DATA:  Encounter for imaging study to confirm nasogastric tube placement. EXAM: PORTABLE ABDOMEN - 1 VIEW COMPARISON:  01/02/2015 FINDINGS: Single view of the abdomen demonstrates a feeding tube that extends into the stomach and appears to terminate near the ligament of Treitz. Moderate stool burden in the abdomen. Gas in the colon. IMPRESSION: Feeding tube tip in the expected region of the ligament of Treitz or proximal jejunum. Electronically Signed   By: Markus Daft M.D.   On: 01/03/2015 11:05   Dg Abd Portable 1v  01/02/2015  CLINICAL DATA:  Feeding tube placement. EXAM: PORTABLE ABDOMEN - 1 VIEW COMPARISON:  12/22/2014 FINDINGS: A feeding tube is now seen with the tip overlying the distal portion of the duodenum in the left upper quadrant. No evidence of dilated bowel loops. Bilateral airspace disease seen in lung bases. IMPRESSION: Feeding tube tip in distal duodenum. Electronically Signed   By: Earle Gell M.D.   On: 01/02/2015 18:34   Dg Abd Portable 1v  12/22/2014  CLINICAL DATA:  NG tube placement, stroke EXAM: PORTABLE ABDOMEN - 1 VIEW COMPARISON:  12/22/2014 FINDINGS: NG tube extends to the proximal stomach. Side port is in the distal esophagus. Normal bowel gas pattern. Degenerative changes of the spine. Heart is enlarged. Prior median sternotomy noted. IMPRESSION: NG tube tip proximal stomach. Electronically Signed   By: Jerilynn Mages.  Shick M.D.   On: 12/22/2014 17:49   Ir Angio Intra Extracran Sel Internal Carotid Uni L Mod Sed  12/22/2014  CLINICAL DATA:  Aphasia.  Left-sided gaze preference. EXAM: IR ANGIO INTRA EXTRACRAN SEL INTERNAL CAROTID UNI LEFT MOD SED LEFT COMMON CAROTID ARTERIOGRAM AND LEFT VERTEBRAL ARTERY ANGIOGRAM AND THE RIGHT COMMON CAROTID ARTERY: ANESTHESIA/SEDATION:  Conscious sedation. MEDICATIONS: As per general anesthesia. CONTRAST:  40 mL OMNIPAQUE IOHEXOL 300 MG/ML  SOLN PROCEDURE: Following a full explanation of the procedure along with the potential associated complications, an informed witnessed consent was obtained. The right groin was prepped and draped in the usual sterile fashion. Thereafter using modified Seldinger technique, transfemoral access into the right common femoral artery was obtained without difficulty. Over a 0.035 inch guidewire, a 5 French Pinnacle sheath was inserted. Through this, and also over a 0.035 inch guidewire a 5 French JB1 catheter was advanced to the aortic arch region and selectively positioned in the left common carotid artery. An arteriogram was then performed proximally and also intracranially. COMPLICATIONS: None immediate. FINDINGS: The left common carotid arteriogram demonstrates early anastomosis from the proximal one third left common carotid artery to opacify a dominant left vertebral artery. The vertebral artery is seen to opacify to the cranial skull base. There is wide patency of the left posterior inferior cerebellar artery and left vertebrobasilar junction. The visualized portion of the basilar artery, the posterior cerebral arteries, and the superior cerebellar arteries is normal into the capillary and the venous phases. The left common carotid bifurcation demonstrates mild caliber irregularity associated with a stent which extends into the proximal left internal carotid artery. There is a focal  area of mild narrowing in the distal one third of the stented segment. Nonvisualization of the left external carotid artery suggests complete occlusion. Surgical clips are seen in the region of the left common carotid artery, left vertebral artery anastomosis. However there is a partial retrograde opacification of the left external carotid artery circulation from the left distal vertebral artery musculoskeletal branches with  retrograde flow into the left occipital artery. More distally, the left internal carotid artery is seen to opacify to the cranial skull base. The petrous, the cavernous and the supraclinoid segments are widely patent. A prominent left posterior communicating artery is seen opacifying the left posterior cerebral artery distribution. The left middle and the left anterior cerebral arteries opacify normally into the capillary and the venous phases. No angiographic evidence of occlusions, stenosis, intracranial filling defects is noted on multiple magnified projections. Also demonstrated is prompt opacification of the right anterior cerebral artery distal to the A2 segment via the anterior communicating artery. Left common carotid arteriogram demonstrates nonvisualization of the right internal carotid artery. The right external carotid artery branches and its origin are widely patent. IMPRESSION: Angiographically no evidence of intracranial filling defects, occlusions, stenoses, dissections or aneurysms noted on the images. Surgical anastomosis between the proximal left common carotid artery and the dominant left vertebral artery. Mild intrastent stenosis of the left internal carotid artery as described above. Angiographically occluded right internal carotid artery and the left external carotid artery. Electronically Signed   By: Luanne Bras M.D.   On: 12/22/2014 08:54   Ir Angio Vertebral Sel Subclavian Innominate Bilat Mod Sed  12/22/2014  CLINICAL DATA:  Aphasia.  Left-sided gaze preference. EXAM: IR ANGIO INTRA EXTRACRAN SEL INTERNAL CAROTID UNI LEFT MOD SED LEFT COMMON CAROTID ARTERIOGRAM AND LEFT VERTEBRAL ARTERY ANGIOGRAM AND THE RIGHT COMMON CAROTID ARTERY: ANESTHESIA/SEDATION: Conscious sedation. MEDICATIONS: As per general anesthesia. CONTRAST:  40 mL OMNIPAQUE IOHEXOL 300 MG/ML  SOLN PROCEDURE: Following a full explanation of the procedure along with the potential associated complications, an  informed witnessed consent was obtained. The right groin was prepped and draped in the usual sterile fashion. Thereafter using modified Seldinger technique, transfemoral access into the right common femoral artery was obtained without difficulty. Over a 0.035 inch guidewire, a 5 French Pinnacle sheath was inserted. Through this, and also over a 0.035 inch guidewire a 5 French JB1 catheter was advanced to the aortic arch region and selectively positioned in the left common carotid artery. An arteriogram was then performed proximally and also intracranially. COMPLICATIONS: None immediate. FINDINGS: The left common carotid arteriogram demonstrates early anastomosis from the proximal one third left common carotid artery to opacify a dominant left vertebral artery. The vertebral artery is seen to opacify to the cranial skull base. There is wide patency of the left posterior inferior cerebellar artery and left vertebrobasilar junction. The visualized portion of the basilar artery, the posterior cerebral arteries, and the superior cerebellar arteries is normal into the capillary and the venous phases. The left common carotid bifurcation demonstrates mild caliber irregularity associated with a stent which extends into the proximal left internal carotid artery. There is a focal area of mild narrowing in the distal one third of the stented segment. Nonvisualization of the left external carotid artery suggests complete occlusion. Surgical clips are seen in the region of the left common carotid artery, left vertebral artery anastomosis. However there is a partial retrograde opacification of the left external carotid artery circulation from the left distal vertebral artery musculoskeletal branches with retrograde flow  into the left occipital artery. More distally, the left internal carotid artery is seen to opacify to the cranial skull base. The petrous, the cavernous and the supraclinoid segments are widely patent. A prominent  left posterior communicating artery is seen opacifying the left posterior cerebral artery distribution. The left middle and the left anterior cerebral arteries opacify normally into the capillary and the venous phases. No angiographic evidence of occlusions, stenosis, intracranial filling defects is noted on multiple magnified projections. Also demonstrated is prompt opacification of the right anterior cerebral artery distal to the A2 segment via the anterior communicating artery. Left common carotid arteriogram demonstrates nonvisualization of the right internal carotid artery. The right external carotid artery branches and its origin are widely patent. IMPRESSION: Angiographically no evidence of intracranial filling defects, occlusions, stenoses, dissections or aneurysms noted on the images. Surgical anastomosis between the proximal left common carotid artery and the dominant left vertebral artery. Mild intrastent stenosis of the left internal carotid artery as described above. Angiographically occluded right internal carotid artery and the left external carotid artery. Electronically Signed   By: Luanne Bras M.D.   On: 12/22/2014 08:54    Labs:  CBC:  Recent Labs  01/06/15 0305 01/07/15 0500 01/08/15 0520 01/09/15 0539  WBC 8.9 9.7 8.6 9.8  HGB 7.7* 7.6* 6.9* 7.3*  HCT 26.0* 25.3* 23.7* 24.9*  PLT 286 272 256 260    COAGS:  Recent Labs  12/21/14 0255 01/01/15 1454  INR 1.18 1.27  APTT  --  44*    BMP:  Recent Labs  01/06/15 0305 01/07/15 0500 01/08/15 0520 01/09/15 0539  NA 154* 152* 149* 145  K 3.6 3.5 3.7 3.8  CL 123* 121* 117* 113*  CO2 25 24 25 26   GLUCOSE 165* 166* 139* 166*  BUN 66* 62* 68* 67*  CALCIUM 8.5* 8.5* 8.3* 7.9*  CREATININE 1.91* 1.89* 2.03* 1.87*  GFRNONAA 31* 32* 29* 32*  GFRAA 36* 37* 34* 37*    LIVER FUNCTION TESTS:  Recent Labs  12/21/14 0255 12/22/14 0316  12/30/14 0333 12/31/14 0436 01/01/15 0323 01/03/15 0439  BILITOT 0.5  0.9  --   --   --   --   --   AST 17 23  --   --   --   --   --   ALT 13* 11*  --   --   --   --   --   ALKPHOS 60 51  --   --   --   --   --   PROT 6.0* 5.2*  --   --   --   --   --   ALBUMIN 3.0* 2.6*  < > 2.2* 2.1* 2.2* 2.1*  < > = values in this interval not displayed.  TUMOR MARKERS: No results for input(s): AFPTM, CEA, CA199, CHROMGRNA in the last 8760 hours.  Assessment and Plan:  Protein calorie malnutrition CVA; dysphagia Long term care Scheduled for perc G tube in IR 11/28 Risks and Benefits discussed with the patient's wife including, but not limited to the need for a barium enema during the procedure, bleeding, infection, peritonitis, or damage to adjacent structures. All of her questions were answered, patient is agreeable to proceed. Consent signed and in chart.   Thank you for this interesting consult.  I greatly enjoyed meeting Darius Barber and look forward to participating in their care.  A copy of this report was sent to the requesting provider on this date.  Signed:  Kwame Ryland A 01/09/2015, 3:04 PM   I spent a total of 40 Minutes    in face to face in clinical consultation, greater than 50% of which was counseling/coordinating care for percutaneous gastric tube placement

## 2015-01-10 ENCOUNTER — Inpatient Hospital Stay (HOSPITAL_COMMUNITY): Payer: Medicare HMO

## 2015-01-10 LAB — BASIC METABOLIC PANEL
Anion gap: 8 (ref 5–15)
BUN: 56 mg/dL — ABNORMAL HIGH (ref 6–20)
CHLORIDE: 111 mmol/L (ref 101–111)
CO2: 27 mmol/L (ref 22–32)
Calcium: 8.6 mg/dL — ABNORMAL LOW (ref 8.9–10.3)
Creatinine, Ser: 1.66 mg/dL — ABNORMAL HIGH (ref 0.61–1.24)
GFR calc Af Amer: 43 mL/min — ABNORMAL LOW (ref >60)
GFR calc non Af Amer: 37 mL/min — ABNORMAL LOW (ref >60)
Glucose, Bld: 163 mg/dL — ABNORMAL HIGH (ref 65–99)
POTASSIUM: 3.7 mmol/L (ref 3.5–5.1)
SODIUM: 146 mmol/L — AB (ref 135–145)

## 2015-01-10 LAB — GLUCOSE, CAPILLARY
GLUCOSE-CAPILLARY: 101 mg/dL — AB (ref 65–99)
GLUCOSE-CAPILLARY: 120 mg/dL — AB (ref 65–99)
GLUCOSE-CAPILLARY: 136 mg/dL — AB (ref 65–99)
GLUCOSE-CAPILLARY: 136 mg/dL — AB (ref 65–99)
GLUCOSE-CAPILLARY: 151 mg/dL — AB (ref 65–99)
GLUCOSE-CAPILLARY: 157 mg/dL — AB (ref 65–99)

## 2015-01-10 NOTE — Progress Notes (Signed)
Increased patients peep level to 8cm due to a decrease in Sp02 level (87-90).

## 2015-01-10 NOTE — Progress Notes (Signed)
PULMONARY  / CRITICAL CARE MEDICINE CONSULTATION   Name: Darius Barber MRN: [id number] DOB: 05/23/1931    ADMISSION DATE:  12/21/2014 CONSULTATION DATE: 12/21/2014  REQUESTING CLINICIAN: Dr. Leonie Man PRIMARY SERVICE: Neurology  CHIEF COMPLAINT:  Stroke  BRIEF PATIENT DESCRIPTION:  81 yo male from [place] with Rt sided weakness, and aphasia from CVA >> received tPA.  Developed progressive mental status change and intubated >> PCCM consulted.  SIGNIFICANT EVENTS and studies: 11/06 Admitted to hospital\ CT Head 11/7 - Left corona radiata infarct. No hemorrhage. Old infarcts.  TTE 11/7 - LV normal in size. Mild LVH. EF 55-60%. LA normal in size. RA normal in size. RV normal in size and function. No pericardial effusion. 11/19 MRI brain > evolving acute to subacute left corona radiata/internal capsule infarct without further propagation; chronically occluded R internal carotid with old R MCA territory infarct.   11/18 bedside tracheostomy Titus Mould) 11/20 tracheostomy site bleeding 11/21 Bleeding from trach >> hold ASA, plavix, SQ heparin 11/23 removed clot from tracheostomy  01/09/15: More awake and alert today; No weaning attempts in the last two days   SUBJECTIVE/OVERNIGHT/INTERVAL HX 01/10/15: Na improved. Nurse denies overnight issues. Wife at bedsie - indicates desire to move him to [name]. PEG pending  VITAL SIGNS: Temp:  [98 F (36.7 C)-98.7 F (37.1 C)] 98 F (36.7 C) (11/26 0800) Pulse Rate:  [66-97] 85 (11/26 1000) Resp:  [14-28] 17 (11/26 1000) BP: (98-155)/(48-82) 128/66 mmHg (11/26 1000) SpO2:  [89 %-95 %] 91 % (11/26 1000) FiO2 (%):  [40 %-45 %] 45 % (11/26 1000) Weight:  [93.4 kg (205 lb 14.6 oz)] 93.4 kg (205 lb 14.6 oz) (11/26 0500)  VENTILATOR SETTINGS: Vent Mode:  [-] PSV;CPAP FiO2 (%):  [40 %-45 %] 45 % Set Rate:  [15 bmp] 15 bmp Vt Set:  [600 mL] 600 mL PEEP:  [5 cmH20-8 cmH20] 8 cmH20 Pressure Support:  [14 cmH20-16 cmH20] 16 cmH20 Plateau Pressure:   [24 cmH20-26 cmH20] 25 cmH20   INTAKE / OUTPUT: Intake/Output      11/25 0701 - 11/26 0700 11/26 0701 - 11/27 0700   I.V. (mL/kg) 480 (5.1) 60 (0.6)   NG/GT 2070 165   IV Piggyback 50    Total Intake(mL/kg) 2600 (27.8) 225 (2.4)   Urine (mL/kg/hr) 3885 (1.7) 400 (1)   Stool 0 (0)    Total Output 3885 400   Net -1285 -175        Stool Occurrence 2 x     PHYSICAL EXAMINATION:  Gen: chronically ill appearing, on vent HENT: Trach in place, no bleeding PULM: No Rhonchi bilaterally CV: RRR, no mgr GI: BS+, soft, nontender MSK: normal bulk and tone Neuro: awake, follows simple commands, cannot answer higher level questions ("does a rock float on water?")   LABS: PULMONARY No results for input(s): PHART, PCO2ART, PO2ART, HCO3, TCO2, O2SAT in the last 168 hours.  Invalid input(s): PCO2, PO2  CBC  Recent Labs Lab 01/07/15 0500 01/08/15 0520 01/09/15 0539  HGB 7.6* 6.9* 7.3*  HCT 25.3* 23.7* 24.9*  WBC 9.7 8.6 9.8  PLT 272 256 260    COAGULATION No results for input(s): INR in the last 168 hours.  CARDIAC   Recent Labs Lab 01/04/15 1840  TROPONINI 0.35*   No results for input(s): PROBNP in the last 168 hours.   CHEMISTRY  Recent Labs Lab 01/04/15 0257  01/05/15 1428 01/06/15 0305 01/07/15 0500 01/08/15 0520 01/09/15 0539 01/10/15 0448  NA 154*  < > 157* 154* 152*  149* 145 146*  K 3.7  < > 3.9 3.6 3.5 3.7 3.8 3.7  CL 122*  < > 123* 123* 121* 117* 113* 111  CO2 25  < > 26 25 24 25 26 27   GLUCOSE 137*  < > 169* 165* 166* 139* 166* 163*  BUN 68*  < > 64* 66* 62* 68* 67* 56*  CREATININE 1.84*  < > 1.81* 1.91* 1.89* 2.03* 1.87* 1.66*  CALCIUM 8.5*  < > 8.8* 8.5* 8.5* 8.3* 7.9* 8.6*  MG 2.7*  --  2.8*  --   --   --   --   --   PHOS 3.5  --   --   --   --   --   --   --   < > = values in this interval not displayed. Estimated Creatinine Clearance: 39.4 mL/min (by C-G formula based on Cr of 1.66).   LIVER No results for input(s): AST, ALT, ALKPHOS,  BILITOT, PROT, ALBUMIN, INR in the last 168 hours.   INFECTIOUS  Recent Labs Lab 01/06/15 1048  LATICACIDVEN 1.4     ENDOCRINE CBG (last 3)   Recent Labs  01/09/15 2313 01/10/15 0405 01/10/15 0806  GLUCAP 126* 151* 157*         IMAGING x48h  - image(s) personally visualized  -   highlighted in bold Dg Chest Port 1 View  01/10/2015  CLINICAL DATA:  Acute respiratory failure EXAM: PORTABLE CHEST 1 VIEW COMPARISON:  01/09/2015 FINDINGS: Tracheostomy tube, feeding tube unchanged. Stable cardiac silhouette. There is diffuse fine airspace disease not changed from prior. LEFT basilar atelectasis small effusion. No pneumothorax IMPRESSION: 1. No interval change in diffuse airspace disease suggesting pulmonary edema. 2. Stable support apparatus. Electronically Signed   By: Suzy Bouchard M.D.   On: 01/10/2015 10:01   Dg Chest Port 1 View  01/09/2015  CLINICAL DATA:  Pneumonia. EXAM: PORTABLE CHEST 1 VIEW COMPARISON:  01/07/2015. FINDINGS: Tracheostomy tube in stable position. Feeding tube noted with tip below left hemidiaphragm. Prior CABG. Stable cardiomegaly. Diffuse dense bilateral pulmonary infiltrates again noted. No interim change. No prominent pleural effusion. No pneumothorax. IMPRESSION: 1. Tracheostomy tube in stable position. Feeding tube noted with tip below left hemidiaphragm . 2.  Prior CABG.  Stable cardiomegaly. 3.  Persistent diffuse bilateral airspace disease.  No change. Electronically Signed   By: Marcello Moores  Register   On: 01/09/2015 07:21       LINES / TUBES: ETT 11/6 >> 11/18 Trach 11/18 >>  Rt radial aline 11/22 >>11/25  CULTURES: Tracheal Aspirate (11/6):  Oral Flora 11/18 resp >> Ecoli amp resistant  ANTIBIOTICS: Unasyn 11/6>>11/16 Ceftriaxone 11/20 >> plan 7 days   DISCUSSION: 81 y/o male with acute left internal capsule infarct with baseline old R MCA stroke admitted on 11/6 and developed confusion and respiratory failure from aspiration/ecoli  pneumonia.  Tracheostomy placed 11/18, has been slow to wean since.  Not LTAC candidate until 21 days on ventilator have passed.  Has ARDS, slow to improve.  ASSESSMENT / PLAN:  PULMONARY A: ARDS due to HCAP,  Possibly some acute pulmonary edema Failure to wean from vent s/p tracheostomy Tracheostomy site bleeding >> resolved   - doing PSV via Trach. No bleeding P:   Pressure support wean as tolerated Diurese today, watch kidney function Hold ASA/Plavix/Sub cutaneous heparin due to tracheostomy bleeding, could restart next week of 01/12/15 after PEG  CARDIOVASCULAR A:  Intermittent VTach Labile BP Hx of HTN, HLD, CAD s/p CABG,  PAD   -not on neo P:   Tele monitroing Prn labetalol  RENAL A:   Hypernatremia> improving > resolved Chronic kidney disease, Baseline Cr ~1.7-2.0   -Na/AKi improved P:   Continue free water, decrease frequency as clinically indicated Monitor BMET and UOP Replace electrolytes as needed  GASTROINTESTINAL A:   Protein calorie malnutrition Dysphagia related to vent, stroke P:   PEG consult for 11/28 Protonix for SUP  HEMATOLOGIC A:   Bleeding from tracheostomy >> improved 11/25 Anemia from blood loss, and critical illness P:  Hold ASA, Plavix, sub cutaneous heparin through 11/28 until after PEG F/u CBC   INFECTIOUS A:   HCAP> E Coli P:   Day 6/7 Rocephin  ENDOCRINE A:   Hyperglycemia P:   Sub cutaneous insulin, sliding scale  NEUROLOGIC A:   Acute left internal capsule and corona radiata stroke > per neurology he may be able to ambulate with support with rehab Baseline old R MCA stroke 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  They voiced understanding and I answered many questions they had.  They will talk to him today about code status, going to [place].  01/10/15- wife wants him at [facility]     Dr. Brand Males, M.D., [facility].C.P Pulmonary and Critical Care Medicine Staff Physician [place] [facility] Pager: [phone], If no answer or between  15:00h - 7:00h: call [phone]  01/10/2015 11:22 AM

## 2015-01-11 LAB — BASIC METABOLIC PANEL
ANION GAP: 5 (ref 5–15)
ANION GAP: 5 (ref 5–15)
BUN: 17 mg/dL (ref 6–20)
BUN: 48 mg/dL — AB (ref 6–20)
CHLORIDE: 108 mmol/L (ref 101–111)
CHLORIDE: 111 mmol/L (ref 101–111)
CO2: 23 mmol/L (ref 22–32)
CO2: 28 mmol/L (ref 22–32)
Calcium: 8.7 mg/dL — ABNORMAL LOW (ref 8.9–10.3)
Calcium: 8.3 mg/dL — ABNORMAL LOW (ref 8.9–10.3)
Creatinine, Ser: 1.35 mg/dL — ABNORMAL HIGH (ref 0.61–1.24)
Creatinine, Ser: 1.51 mg/dL — ABNORMAL HIGH (ref 0.61–1.24)
GFR calc Af Amer: 55 mL/min — ABNORMAL LOW (ref >60)
GFR calc Af Amer: 48 mL/min — ABNORMAL LOW (ref >60)
GFR calc non Af Amer: 48 mL/min — ABNORMAL LOW (ref >60)
GFR, EST NON AFRICAN AMERICAN: 42 mL/min — AB (ref >60)
GLUCOSE: 125 mg/dL — AB (ref 65–99)
GLUCOSE: 139 mg/dL — AB (ref 65–99)
POTASSIUM: 4.9 mmol/L (ref 3.5–5.1)
POTASSIUM: 4 mmol/L (ref 3.5–5.1)
Sodium: 136 mmol/L (ref 135–145)
Sodium: 144 mmol/L (ref 135–145)

## 2015-01-11 LAB — GLUCOSE, CAPILLARY
GLUCOSE-CAPILLARY: 122 mg/dL — AB (ref 65–99)
GLUCOSE-CAPILLARY: 121 mg/dL — AB (ref 65–99)
Glucose-Capillary: 137 mg/dL — ABNORMAL HIGH (ref 65–99)
Glucose-Capillary: 159 mg/dL — ABNORMAL HIGH (ref 65–99)
Glucose-Capillary: 114 mg/dL — ABNORMAL HIGH (ref 65–99)

## 2015-01-11 LAB — TROPONIN I
TROPONIN I: 0.22 ng/mL — AB (ref <0.031)
TROPONIN I: 0.22 ng/mL — AB (ref <0.031)

## 2015-01-11 LAB — MAGNESIUM: Magnesium: 2.4 mg/dL (ref 1.7–2.4)

## 2015-01-11 LAB — PHOSPHORUS: PHOSPHORUS: 3.6 mg/dL (ref 2.5–4.6)

## 2015-01-11 MED ORDER — FREE WATER
200.0000 mL | Freq: Four times a day (QID) | Status: DC
  Administered 2015-01-11 – 2015-01-12 (×2): 200 mL

## 2015-01-11 NOTE — Progress Notes (Signed)
PULMONARY  / CRITICAL CARE MEDICINE CONSULTATION   Name: Darius Barber MRN: [id number] DOB: [birth year]    ADMISSION DATE:  12/21/2014 CONSULTATION DATE: 12/21/2014  REQUESTING CLINICIAN: Dr. Leonie Man PRIMARY SERVICE: Neurology  CHIEF COMPLAINT:  Stroke  BRIEF PATIENT DESCRIPTION:  81 yo male from [place] with Rt sided weakness, and aphasia from CVA >> received tPA.  Developed progressive mental status change and intubated >> PCCM consulted.  SIGNIFICANT EVENTS and studies: 11/06 Admitted to hospital\ CT Head 11/7 - Left corona radiata infarct. No hemorrhage. Old infarcts.  TTE 11/7 - LV normal in size. Mild LVH. EF 55-60%. LA normal in size. RA normal in size. RV normal in size and function. No pericardial effusion. 11/19 MRI brain > evolving acute to subacute left corona radiata/internal capsule infarct without further propagation; chronically occluded R internal carotid with old R MCA territory infarct.   11/18 bedside tracheostomy Titus Mould) 11/20 tracheostomy site bleeding 11/21 Bleeding from trach >> hold ASA, plavix, SQ heparin 11/23 removed clot from tracheostomy  01/09/15: More awake and alert today; No weaning attempts in the last two days   01/10/15: Na improved. Nurse denies overnight issues. Wife at bedsie - indicates desire to move him to [facility]. PEG pending   SUBJECTIVE/OVERNIGHT/INTERVAL HX 01/11/15 - Na normal. Creat better. PEG still pending. Afebrile. Doing PSV but at high level;. Wife and dtr at bedside.   VITAL SIGNS: Temp:  [97.4 F (36.3 C)-99.5 F (37.5 C)] 97.9 F (36.6 C) (11/27 1152) Pulse Rate:  [65-100] 81 (11/27 1120) Resp:  [14-33] 18 (11/27 1120) BP: (91-153)/(54-92) 119/60 mmHg (11/27 1120) SpO2:  [89 %-100 %] 100 % (11/27 1120) FiO2 (%):  [40 %-45 %] 40 % (11/27 1120) Weight:  [95.9 kg (211 lb 6.7 oz)] 95.9 kg (211 lb 6.7 oz) (11/27 0500)  VENTILATOR SETTINGS: Vent Mode:  [-] CPAP;PSV FiO2 (%):  [40 %-45 %] 40 % Set Rate:  [15 bmp] 15  bmp Vt Set:  [600 mL] 600 mL PEEP:  [8 cmH20] 8 cmH20 Pressure Support:  [10 cmH20-16 cmH20] 12 cmH20 Plateau Pressure:  [21 cmH20-30 cmH20] 30 cmH20   INTAKE / OUTPUT: Intake/Output      11/26 0701 - 11/27 0700 11/27 0701 - 11/28 0700   I.V. (mL/kg) 480 (5) 20 (0.2)   NG/GT 1544.1 305   IV Piggyback     Total Intake(mL/kg) 2024.1 (21.1) 325 (3.4)   Urine (mL/kg/hr) 2120 (0.9) 550 (1.2)   Stool 0 (0)    Total Output 2120 550   Net -95.9 -225        Stool Occurrence 2 x     PHYSICAL EXAMINATION:  Gen: chronically ill appearing, on vent HENT: Trach in place, no bleeding PULM: No Rhonchi bilaterally CV: RRR, no mgr GI: BS+, soft, nontender MSK: normal bulk and tone Neuro: awake, follows simple commands, cannot answer higher level questions ("does a rock float on water?"). Some squeeze and movement on left. WEak on right   LABS: PULMONARY No results for input(s): PHART, PCO2ART, PO2ART, HCO3, TCO2, O2SAT in the last 168 hours.  Invalid input(s): PCO2, PO2  CBC  Recent Labs Lab 01/07/15 0500 01/08/15 0520 01/09/15 0539  HGB 7.6* 6.9* 7.3*  HCT 25.3* 23.7* 24.9*  WBC 9.7 8.6 9.8  PLT 272 256 260    COAGULATION No results for input(s): INR in the last 168 hours.  CARDIAC    Recent Labs Lab 01/04/15 1840  TROPONINI 0.35*   No results for input(s): PROBNP in the last  168 hours.   CHEMISTRY  Recent Labs Lab 01/05/15 1428  01/07/15 0500 01/08/15 0520 01/09/15 0539 01/10/15 0448 01/11/15 0235  NA 157*  < > 152* 149* 145 146* 136  K 3.9  < > 3.5 3.7 3.8 3.7 4.9  CL 123*  < > 121* 117* 113* 111 108  CO2 26  < > 24 25 26 27 23   GLUCOSE 169*  < > 166* 139* 166* 163* 125*  BUN 64*  < > 62* 68* 67* 56* 17  CREATININE 1.81*  < > 1.89* 2.03* 1.87* 1.66* 1.35*  CALCIUM 8.8*  < > 8.5* 8.3* 7.9* 8.6* 8.7*  MG 2.8*  --   --   --   --   --   --   < > = values in this interval not displayed. Estimated Creatinine Clearance: 49 mL/min (by C-G formula based on  Cr of 1.35).   LIVER No results for input(s): AST, ALT, ALKPHOS, BILITOT, PROT, ALBUMIN, INR in the last 168 hours.   INFECTIOUS  Recent Labs Lab 01/06/15 1048  LATICACIDVEN 1.4     ENDOCRINE CBG (last 3)   Recent Labs  01/10/15 2343 01/11/15 0341 01/11/15 0825  GLUCAP 120* 137* 159*         IMAGING x48h  - image(s) personally visualized  -   highlighted in bold Dg Chest Port 1 View  01/10/2015  CLINICAL DATA:  Risk for aspiration.  Tube feedings. EXAM: PORTABLE CHEST 1 VIEW COMPARISON:  Radiograph 01/10/2015 FINDINGS: Tracheostomy tube unchanged. Interval removal of feeding tube. Stable cardiac silhouette. There is perihilar airspace densities not changed from prior. LEFT basilar atelectasis. IMPRESSION: No change in perihilar airspace densities. LEFT basilar atelectasis. Electronically Signed   By: Suzy Bouchard M.D.   On: 01/10/2015 14:15   Dg Chest Port 1 View  01/10/2015  CLINICAL DATA:  Acute respiratory failure EXAM: PORTABLE CHEST 1 VIEW COMPARISON:  01/09/2015 FINDINGS: Tracheostomy tube, feeding tube unchanged. Stable cardiac silhouette. There is diffuse fine airspace disease not changed from prior. LEFT basilar atelectasis small effusion. No pneumothorax IMPRESSION: 1. No interval change in diffuse airspace disease suggesting pulmonary edema. 2. Stable support apparatus. Electronically Signed   By: Suzy Bouchard M.D.   On: 01/10/2015 10:01   Dg Abd Portable 1v  01/10/2015  CLINICAL DATA:  NG tube placement. EXAM: PORTABLE ABDOMEN - 1 VIEW COMPARISON:  Single view of the abdomen 01/07/2015. FINDINGS: NG tube is identified with the tip at the junction of the third and fourth segments of the duodenum. The bowel gas pattern is unremarkable. IMPRESSION: As above. Electronically Signed   By: Inge Rise M.D.   On: 01/10/2015 16:58       LINES / TUBES: ETT 11/6 >> 11/18 Trach 11/18 >>  Rt radial aline 11/22 >>11/25   DISCUSSION: 81 y/o male with  acute left internal capsule infarct with baseline old R MCA stroke admitted on 11/6 and developed confusion and respiratory failure from aspiration/ecoli pneumonia.  Tracheostomy placed 11/18, has been slow to wean since.  Not LTAC candidate until 21 days on ventilator have passed.  Has ARDS, slow to improve.  ASSESSMENT / PLAN:  PULMONARY A: ARDS due to HCAP,  Possibly some acute pulmonary edema Failure to wean from vent s/p tracheostomy Tracheostomy site bleeding >> resolved   - doing PSV via Trach. No bleeding P:   Pressure support wean as tolerated Hold ASA/Plavix/Sub cutaneous heparin due to tracheostomy bleeding, could restart next week of 01/12/15 after PEG  CARDIOVASCULAR A:  Intermittent VTach Labile BP Hx of HTN, HLD, CAD s/p CABG, PAD   -not on neo P:   Tele monitroing Prn labetalol Lipitor daily  RENAL A:   Hypernatremia> improving > resolved Chronic kidney disease, Baseline Cr ~1.7-2.0   -Na/AKi improved P:   Continue free water, decrease frequency to 200cc Q6h Monitor BMET and UOP Replace electrolytes as needed  GASTROINTESTINAL A:   Protein calorie malnutrition Dysphagia related to vent, stroke P:   PEG consult for 11/28 Protonix for SUP  HEMATOLOGIC A:   Bleeding from tracheostomy >> improved 11/25 Anemia from blood loss, and critical illness P:  Hold ASA, Plavix, sub cutaneous heparin through 11/28 until after PEG F/u CBC   INFECTIOUS  A:   CULTURES: Tracheal Aspirate (11/6):  Oral Flora 11/18 resp >> Ecoli amp resistant  HCAP> E Coli P:   Day 7/7 Rocephin   ANTIBIOTICS: Unasyn 11/6>>11/16 Ceftriaxone 11/20 >>  LAST DAY 01/11/15  ENDOCRINE A:   Hyperglycemia P:   Sub cutaneous insulin, sliding scale  NEUROLOGIC A:   Acute left internal capsule and corona radiata stroke > per neurology he may be able to ambulate with support with rehab Baseline old R MCA stroke Normopressure hydrocephalus (changes on  MRI/CT) Encephalopathy> ICU related, follows commands but does not answer CAM-ICU questions appropriately   - no changed P:   RASS goal: 0 PRN fentanyl   GOALS of care 01/09/15 by PCCM MD Dr Lake Bells:  I had a lengthy conversation with his family today (nearly one hour) during which time I explained that he may recover neurologically to a point where he can walk with a walker, but that his overall prognosis is poor given his ARDS, prolonged vent support, recurrent infections, CKD, and advanced age.  They voiced understanding and I answered many questions they had.  They will talk to him today about code status, going to [place].  01/10/15- wife wants him at [facility]  01/11/15- family updated with RN present    Dr. Brand Males, M.D., [facility].C.P Pulmonary and Critical Care Medicine Staff Physician [place] [facility] Pager: [phone], If no answer or between  15:00h - 7:00h: call [phone]  01/11/2015 11:56 AM

## 2015-01-11 NOTE — Progress Notes (Signed)
   16 beat run NS [employer] Bmet, phos, mag,  trop x 3  Dr. Brand Males, M.D., [facility].C.P Pulmonary and Critical Care Medicine Staff Physician [place] [facility] Pager: [phone], If no answer or between  15:00h - 7:00h: call [phone]  01/11/2015 1:35 PM

## 2015-01-12 ENCOUNTER — Inpatient Hospital Stay (HOSPITAL_COMMUNITY): Payer: Medicare HMO

## 2015-01-12 LAB — GLUCOSE, CAPILLARY
GLUCOSE-CAPILLARY: 103 mg/dL — AB (ref 65–99)
GLUCOSE-CAPILLARY: 126 mg/dL — AB (ref 65–99)
GLUCOSE-CAPILLARY: 136 mg/dL — AB (ref 65–99)
GLUCOSE-CAPILLARY: 141 mg/dL — AB (ref 65–99)
GLUCOSE-CAPILLARY: 94 mg/dL (ref 65–99)
GLUCOSE-CAPILLARY: 98 mg/dL (ref 65–99)

## 2015-01-12 LAB — BASIC METABOLIC PANEL
ANION GAP: 7 (ref 5–15)
BUN: 44 mg/dL — AB (ref 6–20)
CHLORIDE: 112 mmol/L — AB (ref 101–111)
CO2: 29 mmol/L (ref 22–32)
Calcium: 8.6 mg/dL — ABNORMAL LOW (ref 8.9–10.3)
Creatinine, Ser: 1.47 mg/dL — ABNORMAL HIGH (ref 0.61–1.24)
GFR calc Af Amer: 50 mL/min — ABNORMAL LOW (ref >60)
GFR, EST NON AFRICAN AMERICAN: 43 mL/min — AB (ref >60)
Glucose, Bld: 119 mg/dL — ABNORMAL HIGH (ref 65–99)
POTASSIUM: 4.1 mmol/L (ref 3.5–5.1)
SODIUM: 148 mmol/L — AB (ref 135–145)

## 2015-01-12 LAB — CBC
HEMATOCRIT: 24.8 % — AB (ref 39.0–52.0)
Hemoglobin: 7.4 g/dL — ABNORMAL LOW (ref 13.0–17.0)
MCH: 29.5 pg (ref 26.0–34.0)
MCHC: 29.8 g/dL — ABNORMAL LOW (ref 30.0–36.0)
MCV: 98.8 fL (ref 78.0–100.0)
PLATELETS: 217 10*3/uL (ref 150–400)
RBC: 2.51 MIL/uL — AB (ref 4.22–5.81)
RDW: 20.9 % — AB (ref 11.5–15.5)
WBC: 8.3 10*3/uL (ref 4.0–10.5)

## 2015-01-12 LAB — MAGNESIUM: MAGNESIUM: 2.5 mg/dL — AB (ref 1.7–2.4)

## 2015-01-12 LAB — PROTIME-INR
INR: 1.27 (ref 0.00–1.49)
Prothrombin Time: 16 seconds — ABNORMAL HIGH (ref 11.6–15.2)

## 2015-01-12 LAB — TROPONIN I: Troponin I: 0.25 ng/mL — ABNORMAL HIGH (ref <0.031)

## 2015-01-12 LAB — APTT: APTT: 38 s — AB (ref 24–37)

## 2015-01-12 MED ORDER — FENTANYL CITRATE (PF) 100 MCG/2ML IJ SOLN
INTRAMUSCULAR | Status: AC | PRN
  Administered 2015-01-12: 50 ug via INTRAVENOUS
  Administered 2015-01-12: 25 ug via INTRAVENOUS

## 2015-01-12 MED ORDER — GLUCAGON HCL (RDNA) 1 MG IJ SOLR
INTRAMUSCULAR | Status: AC | PRN
Start: 2015-01-12 — End: 2015-01-12
  Administered 2015-01-12: 1 mg via INTRAVENOUS

## 2015-01-12 MED ORDER — FENTANYL CITRATE (PF) 100 MCG/2ML IJ SOLN
INTRAMUSCULAR | Status: AC
  Filled 2015-01-12: qty 2

## 2015-01-12 MED ORDER — CEFAZOLIN SODIUM-DEXTROSE 2-3 GM-% IV SOLR
INTRAVENOUS | Status: AC
  Filled 2015-01-12: qty 50

## 2015-01-12 MED ORDER — CEFAZOLIN SODIUM-DEXTROSE 2-3 GM-% IV SOLR
2.0000 g | INTRAVENOUS | Status: AC
  Administered 2015-01-12: 2 g via INTRAVENOUS

## 2015-01-12 MED ORDER — MIDAZOLAM HCL 2 MG/2ML IJ SOLN
INTRAMUSCULAR | Status: AC
  Filled 2015-01-12: qty 2

## 2015-01-12 MED ORDER — LIDOCAINE HCL 1 % IJ SOLN
INTRAMUSCULAR | Status: AC
Start: 2015-01-12 — End: 2015-01-12
  Filled 2015-01-12: qty 20

## 2015-01-12 MED ORDER — IOHEXOL 300 MG/ML  SOLN
50.0000 mL | Freq: Once | INTRAMUSCULAR | Status: AC | PRN
  Administered 2015-01-12: 10 mL via INTRAVENOUS

## 2015-01-12 MED ORDER — FREE WATER
300.0000 mL | Status: DC
  Administered 2015-01-12 – 2015-01-15 (×18): 300 mL

## 2015-01-12 MED ORDER — GLUCAGON HCL RDNA (DIAGNOSTIC) 1 MG IJ SOLR
INTRAMUSCULAR | Status: AC
  Filled 2015-01-12: qty 1

## 2015-01-12 MED ORDER — MIDAZOLAM HCL 2 MG/2ML IJ SOLN
INTRAMUSCULAR | Status: AC | PRN
Start: 2015-01-12 — End: 2015-01-12
  Administered 2015-01-12: 1 mg via INTRAVENOUS

## 2015-01-12 NOTE — Progress Notes (Signed)
Nutrition Follow-up  INTERVENTION:    As medically appropriate, resume Jevity 1.2 formula at 55 ml/hr with Prostat liquid protein 30 ml TID  Total TF regimen providing 1884 kcal, 118 grams protein, and 1069 ml of free water  NUTRITION DIAGNOSIS:   Inadequate oral intake related to inability to eat as evidenced by NPO status, ongoing  GOAL:   Patient will meet greater than or equal to 90% of their needs, met  MONITOR:   Vent status, TF tolerance, Labs, Weight trends, I & O's  ASSESSMENT:   81 yo male from [place] with altered mental status, Rt sided weakness, aphasia with concern for CVA. S/P tPA and neuro IR. Acute respiratory failure 2nd to aspiration PNA and compromised airway.   Patient is currently on ventilator support via trach MV: 9.2 L/min Temp (24hrs), Avg:98.3 F (36.8 C), Min:97.5 F (36.4 C), Max:99.4 F (37.4 C)   Patient s/p PEG tube placement today. TF (Jevity 1.2 formula) currently off s/p procedure.  CWOCN note reviewed.  Pt with trach full thickness wound to L side.  Diet Order:  Diet NPO time specified Except for: Ice Chips  Skin:  Wound (see comment) (trach wound)  Last BM:  11/19  Height:   Ht Readings from Last 1 Encounters:  12/21/14 _0  (1.753 m)   Weight:   Wt Readings from Last 1 Encounters:  01/12/15 206 lb 9.1 oz (93.7 kg)   Ideal Body Weight:  72.7 kg  BMI:  Body mass index is 30.49 kg/(m^2).  Estimated Nutritional Needs:   Kcal:  1892  Protein:  110-120 gm  Fluid:  >1.9 L/day  EDUCATION NEEDS:   No education needs identified at this time  Arthur Holms, RD, LDN Pager #: [phone] After-Hours Pager #: [phone]

## 2015-01-12 NOTE — Progress Notes (Signed)
PT Cancellation Note  Patient Details Name: TURRELL LOAIZA MRN: [id number] DOB: 02-01-1932   Cancelled Treatment:    Reason Eval/Treat Not Completed: Patient at procedure or test/unavailable.  Pt just leaving for IR for Peg placement.  Will f/u another time.     Aowyn Rozeboom, Thornton Papas 01/12/2015, 10:33 AM

## 2015-01-12 NOTE — Progress Notes (Signed)
Pt transported to/from IR on vent  w/ no apparent complications.

## 2015-01-12 NOTE — Progress Notes (Signed)
SLP Cancellation Note  Patient Details Name: Darius Barber MRN: [id number] DOB: 1931/09/25   Cancelled treatment:       Reason Eval/Treat Not Completed: Other (comment) (going for PEG today. will f/u 11/29. ) Gabriel Rainwater [place], [place] [phone]   Sherrika Weakland Meryl 01/12/2015, 10:22 AM

## 2015-01-12 NOTE — Procedures (Signed)
20 Fr pull through G tube No comp/EBL

## 2015-01-12 NOTE — Progress Notes (Signed)
OT Cancellation Note  Patient Details Name: Darius Barber MRN: [id number] DOB: 12-24-1931   Cancelled Treatment:    Reason Eval/Treat Not Completed: Patient at procedure or test/ unavailable - Pt gone for PEG  Darlina Rumpf [place], OTR/L [id number]  01/12/2015, 10:52 AM

## 2015-01-12 NOTE — Consult Note (Addendum)
WOC wound consult note Reason for Consult: Consult requested for trach site.  Trach faceplate has been tightly sutured to the left and right sides of his neck and even a split thickness gauze cannot be inserted underneath.   Wound type: Left side of trach with full thickness wound; .4X.4cm, 90% yellow slough, 10% red, mod amt yellow drainage, no odor. Dressing procedure/placement/frequency: It is difficult to reduce pressure to the affected area since he must be kept at 30 degrees for the head of the bed, which pushes trach faceplate against this section of the neck, and also sutures need to remain in place several more days.  Aquacel to absorb drainage and provide antimicrobial benefits to affected area. Foam dressing to provide additional padding over site. Please re-consult if further assistance is needed.  Thank-you,  Julien Girt MSN, [place], [place], [place], [place]

## 2015-01-12 NOTE — Progress Notes (Signed)
PULMONARY  / CRITICAL CARE MEDICINE CONSULTATION   Name: Darius Barber MRN: [id number] DOB: 1931-11-25    ADMISSION DATE:  12/21/2014 CONSULTATION DATE: 12/21/2014  REQUESTING CLINICIAN: Dr. Leonie Man PRIMARY SERVICE: Neurology  CHIEF COMPLAINT:  Stroke  BRIEF PATIENT DESCRIPTION:  81 yo male from [place] with Rt sided weakness, and aphasia from CVA >> received tPA.  Developed progressive mental status change and intubated >> PCCM consulted.  SIGNIFICANT EVENTS and studies: 11/06 Admitted to hospital\ CT Head 11/7 - Left corona radiata infarct. No hemorrhage. Old infarcts.  TTE 11/7 - LV normal in size. Mild LVH. EF 55-60%. LA normal in size. RA normal in size. RV normal in size and function. No pericardial effusion. 11/19 MRI brain > evolving acute to subacute left corona radiata/internal capsule infarct without further propagation; chronically occluded R internal carotid with old R MCA territory infarct.   11/18 bedside tracheostomy Titus Mould) 11/20 tracheostomy site bleeding 11/21 Bleeding from trach >> hold ASA, plavix, SQ heparin 11/23 removed clot from tracheostomy 01/09/15: More awake and alert today; No weaning attempts in the last two days 01/10/15: Na improved. Nurse denies overnight issues. Wife at bedsie - indicates desire to move him to [facility]. PEG pending   SUBJECTIVE/OVERNIGHT/INTERVAL HX Worsening hypernatremia and renal function, unable to wean this AM.   VITAL SIGNS: Temp:  [97.5 F (36.4 C)-99.4 F (37.4 C)] 97.9 F (36.6 C) (11/28 0803) Pulse Rate:  [57-100] 93 (11/28 1000) Resp:  [14-25] 23 (11/28 1000) BP: (98-165)/(55-115) 145/115 mmHg (11/28 1000) SpO2:  [91 %-100 %] 93 % (11/28 1000) FiO2 (%):  [40 %] 40 % (11/28 0930) Weight:  [93.7 kg (206 lb 9.1 oz)] 93.7 kg (206 lb 9.1 oz) (11/28 0428)  VENTILATOR SETTINGS: Vent Mode:  [-] PSV;CPAP FiO2 (%):  [40 %] 40 % Set Rate:  [15 bmp] 15 bmp Vt Set:  [600 mL] 600 mL PEEP:  [5 cmH20-8 cmH20] 8  cmH20 Pressure Support:  [12 cmH20] 12 cmH20 Plateau Pressure:  [21 cmH20-26 cmH20] 21 cmH20   INTAKE / OUTPUT: Intake/Output      11/27 0701 - 11/28 0700 11/28 0701 - 11/29 0700   I.V. (mL/kg) 460 (4.9)    NG/GT 1525    Total Intake(mL/kg) 1985 (21.2)    Urine (mL/kg/hr) 2197 (1)    Stool     Total Output 2197     Net -212           PHYSICAL EXAMINATION:  Gen: chronically ill appearing, on vent HENT: Trach in place, no bleeding PULM: CTA bilaterally CV: RRR, no mgr GI: BS+, soft, nontender MSK: normal bulk and tone Neuro: awake, follows simple commands, Some squeeze and movement on left. Weak on right   LABS: PULMONARY No results for input(s): PHART, PCO2ART, PO2ART, HCO3, TCO2, O2SAT in the last 168 hours.  Invalid input(s): PCO2, PO2  CBC  Recent Labs Lab 01/08/15 0520 01/09/15 0539 01/12/15 0656  HGB 6.9* 7.3* 7.4*  HCT 23.7* 24.9* 24.8*  WBC 8.6 9.8 8.3  PLT 256 260 217    COAGULATION  Recent Labs Lab 01/12/15 0656  INR 1.27    CARDIAC    Recent Labs Lab 01/11/15 1405 01/11/15 2134 01/12/15 0656  TROPONINI 0.22* 0.22* 0.25*   No results for input(s): PROBNP in the last 168 hours.   CHEMISTRY  Recent Labs Lab 01/05/15 1428  01/09/15 0539 01/10/15 0448 01/11/15 0235 01/11/15 1405 01/12/15 0656  NA 157*  < > 145 146* 136 144 148*  K 3.9  < >  3.8 3.7 4.9 4.0 4.1  CL 123*  < > 113* 111 108 111 112*  CO2 26  < > 26 27 23 28 29   GLUCOSE 169*  < > 166* 163* 125* 139* 119*  BUN 64*  < > 67* 56* 17 48* 44*  CREATININE 1.81*  < > 1.87* 1.66* 1.35* 1.51* 1.47*  CALCIUM 8.8*  < > 7.9* 8.6* 8.7* 8.3* 8.6*  MG 2.8*  --   --   --   --  2.4 2.5*  PHOS  --   --   --   --   --  3.6  --   < > = values in this interval not displayed. Estimated Creatinine Clearance: 44.5 mL/min (by C-G formula based on Cr of 1.47).   LIVER  Recent Labs Lab 01/12/15 0656  INR 1.27     INFECTIOUS  Recent Labs Lab 01/06/15 1048  LATICACIDVEN 1.4      ENDOCRINE CBG (last 3)   Recent Labs  01/12/15 0024 01/12/15 0347 01/12/15 0802  GLUCAP 141* 98 94         IMAGING x48h  - image(s) personally visualized  -   highlighted in bold Dg Chest Port 1 View  01/10/2015  CLINICAL DATA:  Risk for aspiration.  Tube feedings. EXAM: PORTABLE CHEST 1 VIEW COMPARISON:  Radiograph 01/10/2015 FINDINGS: Tracheostomy tube unchanged. Interval removal of feeding tube. Stable cardiac silhouette. There is perihilar airspace densities not changed from prior. LEFT basilar atelectasis. IMPRESSION: No change in perihilar airspace densities. LEFT basilar atelectasis. Electronically Signed   By: Suzy Bouchard M.D.   On: 01/10/2015 14:15   Dg Abd Portable 1v  01/10/2015  CLINICAL DATA:  NG tube placement. EXAM: PORTABLE ABDOMEN - 1 VIEW COMPARISON:  Single view of the abdomen 01/07/2015. FINDINGS: NG tube is identified with the tip at the junction of the third and fourth segments of the duodenum. The bowel gas pattern is unremarkable. IMPRESSION: As above. Electronically Signed   By: Inge Rise M.D.   On: 01/10/2015 16:58   LINES / TUBES: ETT 11/6 >> 11/18 Trach 11/18 >>  Rt radial aline 11/22 >>11/25  DISCUSSION: 81 y/o male with acute left internal capsule infarct with baseline old R MCA stroke admitted on 11/6 and developed confusion and respiratory failure from aspiration/ecoli pneumonia.  Tracheostomy placed 11/18, has been slow to wean since.  Not LTAC candidate until 21 days on ventilator have passed.  Has ARDS, slow to improve.  ASSESSMENT / PLAN:  PULMONARY A: ARDS due to HCAP, continues to have high PEEP and FiO2 needs.  Possibly some acute pulmonary edema. Failure to wean from vent s/p tracheostomy. Tracheostomy site bleeding >> resolved  P:   Pressure support wean as tolerated, failed tday with high PEEP and FiO2. Hold ASA/Plavix/Sub cutaneous heparin due to tracheostomy bleeding, will restart 11/29 (PEG to be placed on  11/28).  CARDIOVASCULAR A:  Intermittent VTach Labile BP Hx of HTN, HLD, CAD s/p CABG, PAD Troponin 0.25, demand ischemia likely from VT episode.  P:  Tele monitroing Prn labetalol Lipitor daily Recheck Troponin again in AM, if remains high then will call cards if lower then will d/c further checks.  RENAL A:   Hypernatremia> worse Chronic kidney disease, Baseline Cr ~1.7-2.0  P:   Increase free water to 300 q4. Monitor BMET and UOP Replace electrolytes as needed  GASTROINTESTINAL A:   Protein calorie malnutrition Dysphagia related to vent, stroke  P:   PEG to be placed  11/28. Protonix for SUP. Resume TF in AM.  HEMATOLOGIC A:   Bleeding from tracheostomy >> improved 11/25 Anemia from blood loss, and critical illness  P:  Hold ASA, Plavix, sub cutaneous heparin restart 11/29 after PEG placement. F/u CBC   INFECTIOUS A:   CULTURES: Tracheal Aspirate (11/6):  Oral Flora 11/18 resp >> Ecoli amp resistant HCAP> E Coli  P:   ANTIBIOTICS: Unasyn 11/6>>11/16 Ceftriaxone 11/20 >>  LAST DAY 01/11/15  ENDOCRINE A:   Hyperglycemia P:   Sub cutaneous insulin, sliding scale  NEUROLOGIC A:   Acute left internal capsule and corona radiata stroke > per neurology he may be able to ambulate with support with rehab Baseline old R MCA stroke Normopressure hydrocephalus (changes on MRI/CT) Encephalopathy> ICU related, follows commands but does not answer CAM-ICU questions appropriately  P:   RASS goal: 0 PRN fentanyl  GOALS of care 01/09/15 by PCCM MD Dr Lake Bells:  I had a lengthy conversation with his family today (nearly one hour) during which time I explained that he may recover neurologically to a point where he can walk with a walker, but that his overall prognosis is poor given his ARDS, prolonged vent support, recurrent infections, CKD, and advanced age.  They voiced understanding and I answered many questions they had.  They will talk to him today about  code status, going to [place].  01/10/15- wife wants him at [facility] after 21 days of hospitalization.  Poor prognosis.  The patient is critically ill with multiple organ systems failure and requires high complexity decision making for assessment and support, frequent evaluation and titration of therapies, application of advanced monitoring technologies and extensive interpretation of multiple databases.   Critical Care Time devoted to patient care services described in this note is  35  Minutes. This time reflects time of care of this signee Dr Jennet Maduro. This critical care time does not reflect procedure time, or teaching time or supervisory time of PA/NP/Med student/Med Resident etc but could involve care discussion time.  Rush Farmer, M.D. [facility] Pulmonary/Critical Care Medicine. Pager: [phone]. After hours pager: [phone].  01/12/2015 10:31 AM

## 2015-01-13 ENCOUNTER — Inpatient Hospital Stay (HOSPITAL_COMMUNITY): Payer: Medicare HMO

## 2015-01-13 DIAGNOSIS — R7989 Other specified abnormal findings of blood chemistry: Secondary | ICD-10-CM

## 2015-01-13 DIAGNOSIS — A419 Sepsis, unspecified organism: Secondary | ICD-10-CM

## 2015-01-13 DIAGNOSIS — R6521 Severe sepsis with septic shock: Secondary | ICD-10-CM

## 2015-01-13 LAB — URINALYSIS, ROUTINE W REFLEX MICROSCOPIC
BILIRUBIN URINE: NEGATIVE
GLUCOSE, UA: NEGATIVE mg/dL
Hgb urine dipstick: NEGATIVE
KETONES UR: NEGATIVE mg/dL
Nitrite: NEGATIVE
PH: 7 (ref 5.0–8.0)
Protein, ur: 30 mg/dL — AB
Specific Gravity, Urine: 1.017 (ref 1.005–1.030)

## 2015-01-13 LAB — TROPONIN I: TROPONIN I: 0.31 ng/mL — AB (ref <0.031)

## 2015-01-13 LAB — CBC
HEMATOCRIT: 25.2 % — AB (ref 39.0–52.0)
Hemoglobin: 7.2 g/dL — ABNORMAL LOW (ref 13.0–17.0)
MCH: 28.2 pg (ref 26.0–34.0)
MCHC: 28.6 g/dL — ABNORMAL LOW (ref 30.0–36.0)
MCV: 98.8 fL (ref 78.0–100.0)
PLATELETS: 213 10*3/uL (ref 150–400)
RBC: 2.55 MIL/uL — AB (ref 4.22–5.81)
RDW: 21.1 % — AB (ref 11.5–15.5)
WBC: 11.2 10*3/uL — AB (ref 4.0–10.5)

## 2015-01-13 LAB — URINE MICROSCOPIC-ADD ON
Bacteria, UA: NONE SEEN
RBC / HPF: NONE SEEN RBC/hpf (ref 0–5)

## 2015-01-13 LAB — GLUCOSE, CAPILLARY
GLUCOSE-CAPILLARY: 105 mg/dL — AB (ref 65–99)
GLUCOSE-CAPILLARY: 113 mg/dL — AB (ref 65–99)
GLUCOSE-CAPILLARY: 130 mg/dL — AB (ref 65–99)
GLUCOSE-CAPILLARY: 132 mg/dL — AB (ref 65–99)
GLUCOSE-CAPILLARY: 139 mg/dL — AB (ref 65–99)
Glucose-Capillary: 155 mg/dL — ABNORMAL HIGH (ref 65–99)

## 2015-01-13 LAB — BASIC METABOLIC PANEL
Anion gap: 6 (ref 5–15)
BUN: 48 mg/dL — AB (ref 6–20)
CO2: 28 mmol/L (ref 22–32)
CREATININE: 1.59 mg/dL — AB (ref 0.61–1.24)
Calcium: 8.6 mg/dL — ABNORMAL LOW (ref 8.9–10.3)
Chloride: 114 mmol/L — ABNORMAL HIGH (ref 101–111)
GFR, EST AFRICAN AMERICAN: 45 mL/min — AB (ref >60)
GFR, EST NON AFRICAN AMERICAN: 39 mL/min — AB (ref >60)
Glucose, Bld: 144 mg/dL — ABNORMAL HIGH (ref 65–99)
POTASSIUM: 4 mmol/L (ref 3.5–5.1)
SODIUM: 148 mmol/L — AB (ref 135–145)

## 2015-01-13 LAB — PHOSPHORUS: PHOSPHORUS: 3.6 mg/dL (ref 2.5–4.6)

## 2015-01-13 LAB — MAGNESIUM: Magnesium: 2.5 mg/dL — ABNORMAL HIGH (ref 1.7–2.4)

## 2015-01-13 MED ORDER — VANCOMYCIN HCL 10 G IV SOLR
2000.0000 mg | Freq: Once | INTRAVENOUS | Status: AC
  Administered 2015-01-13: 2000 mg via INTRAVENOUS
  Filled 2015-01-13: qty 2000

## 2015-01-13 MED ORDER — CLOPIDOGREL BISULFATE 75 MG PO TABS
75.0000 mg | ORAL_TABLET | Freq: Every day | ORAL | Status: DC
  Administered 2015-01-13 – 2015-01-15 (×3): 75 mg
  Filled 2015-01-13 (×3): qty 1

## 2015-01-13 MED ORDER — DEXTROSE 5 % IV SOLN
2.0000 g | Freq: Two times a day (BID) | INTRAVENOUS | Status: DC
  Administered 2015-01-13 – 2015-01-15 (×6): 2 g via INTRAVENOUS
  Filled 2015-01-13 (×7): qty 2

## 2015-01-13 MED ORDER — ASPIRIN 81 MG PO CHEW
81.0000 mg | CHEWABLE_TABLET | Freq: Every day | ORAL | Status: DC
  Administered 2015-01-13 – 2015-01-15 (×3): 81 mg
  Filled 2015-01-13 (×3): qty 1

## 2015-01-13 MED ORDER — SODIUM CHLORIDE 0.9 % IV BOLUS (SEPSIS)
500.0000 mL | Freq: Once | INTRAVENOUS | Status: AC
  Administered 2015-01-13: 500 mL via INTRAVENOUS

## 2015-01-13 MED ORDER — VANCOMYCIN HCL IN DEXTROSE 750-5 MG/150ML-% IV SOLN
750.0000 mg | Freq: Two times a day (BID) | INTRAVENOUS | Status: DC
  Administered 2015-01-14 – 2015-01-15 (×4): 750 mg via INTRAVENOUS
  Filled 2015-01-13 (×5): qty 150

## 2015-01-13 MED ORDER — ATENOLOL 25 MG PO TABS
25.0000 mg | ORAL_TABLET | Freq: Every day | ORAL | Status: DC
  Administered 2015-01-14 – 2015-01-15 (×2): 25 mg
  Filled 2015-01-13 (×3): qty 1

## 2015-01-13 NOTE — Progress Notes (Signed)
Pt blood pressure 84/50. Temp at 8:26 PM was 102.9. Temp decreased to 99.6. Notified MD Elsworth Soho. Orders received.

## 2015-01-13 NOTE — Care Management Important Message (Signed)
Important Message  Patient Details  Name: Darius Barber MRN: [id number] Date of Birth: 06/11/1931   Medicare Important Message Given:  Yes    Demar Shad Abena 01/13/2015, 12:30 PM

## 2015-01-13 NOTE — Progress Notes (Signed)
RT note- Patient wean 2 times today for one hour each on 60%ATC. Patient is now back to full support due to work of breathing.

## 2015-01-13 NOTE — Progress Notes (Signed)
eLink Physician-Brief Progress Note Patient Name: Darius Barber DOB: 06-Mar-1931 MRN: [id number]   Date of Service  01/13/2015  HPI/Events of Note  Hypotensive Febrile earlier E coli  -resp cx in past  eICU Interventions  NS bolus  empiric ceftaz Pan cx Chk lactate     Intervention Category Major Interventions: Sepsis - evaluation and management  ALVA,RAKESH V. 01/13/2015, 1:35 AM

## 2015-01-13 NOTE — Progress Notes (Signed)
ANTIBIOTIC CONSULT NOTE - INITIAL  Pharmacy Consult for vancomycin Indication: rule out sepsis  Allergies  Allergen Reactions  . Simvastatin     REACTION: muscle aches    Patient Measurements: Height: 5\' 9"  (175.3 cm) Weight: 206 lb 12.7 oz (93.8 kg) IBW/kg (Calculated) : 70.7 Adjusted Body Weight:   Vital Signs: Temp: 97.6 F (36.4 C) (11/29 1143) Temp Source: Axillary (11/29 1143) BP: 90/51 mmHg (11/29 1100) Pulse Rate: 78 (11/29 1100) Intake/Output from previous day: 11/28 0701 - 11/29 0700 In: 1500 [I.V.:480; NG/GT:470; IV Piggyback:550] Out: [id number] [Urine:1485] Intake/Output from this shift: Total I/O In: 762.5 [I.V.:80; Other:210; NG/GT:422.5; IV Piggyback:50] Out: 350 [Urine:350]  Labs:  Recent Labs  01/11/15 1405 01/12/15 0656 01/13/15 0213  WBC  --  8.3 11.2*  HGB  --  7.4* 7.2*  PLT  --  217 213  CREATININE 1.51* 1.47* 1.59*   Estimated Creatinine Clearance: 41.2 mL/min (by C-G formula based on Cr of 1.59). No results for input(s): VANCOTROUGH, VANCOPEAK, VANCORANDOM, GENTTROUGH, GENTPEAK, GENTRANDOM, TOBRATROUGH, TOBRAPEAK, TOBRARND, AMIKACINPEAK, AMIKACINTROU, AMIKACIN in the last 72 hours.   Microbiology: Recent Results (from the past 720 hour(s))  MRSA PCR Screening     Status: None   Collection Time: 12/21/14  5:24 AM  Result Value Ref Range Status   MRSA by PCR NEGATIVE NEGATIVE Final    Comment:        The GeneXpert MRSA Assay (FDA approved for NASAL specimens only), is one component of a comprehensive MRSA colonization surveillance program. It is not intended to diagnose MRSA infection nor to guide or monitor treatment for MRSA infections.   Culture, respiratory (NON-Expectorated)     Status: None   Collection Time: 12/21/14  9:56 AM  Result Value Ref Range Status   Specimen Description ENDOTRACHEAL  Final   Special Requests NONE  Final   Gram Stain   Final    FEW WBC PRESENT, PREDOMINANTLY PMN NO SQUAMOUS EPITHELIAL CELLS  SEEN NO ORGANISMS SEEN Performed at [facility]    Culture   Final    Non-Pathogenic Oropharyngeal-type Flora Isolated. Performed at [facility]    Report Status 12/23/2014 FINAL  Final  Culture, respiratory (NON-Expectorated)     Status: None   Collection Time: 01/01/15  9:09 AM  Result Value Ref Range Status   Specimen Description TRACHEAL ASPIRATE  Final   Special Requests NONE  Final   Gram Stain   Final    ABUNDANT WBC PRESENT, PREDOMINANTLY PMN NO SQUAMOUS EPITHELIAL CELLS SEEN NO ORGANISMS SEEN Performed at [facility]    Culture   Final    FEW ESCHERICHIA COLI Performed at [facility]    Report Status 01/03/2015 FINAL  Final   Organism ID, Bacteria ESCHERICHIA COLI  Final      Susceptibility   Escherichia coli - MIC*    AMPICILLIN >=32 RESISTANT Resistant     AMPICILLIN/SULBACTAM >=32 RESISTANT Resistant     CEFAZOLIN >=64 RESISTANT Resistant     CEFEPIME <=1 SENSITIVE Sensitive     CEFTAZIDIME <=1 SENSITIVE Sensitive     CEFTRIAXONE <=1 SENSITIVE Sensitive     CIPROFLOXACIN <=0.25 SENSITIVE Sensitive     GENTAMICIN <=1 SENSITIVE Sensitive     IMIPENEM <=0.25 SENSITIVE Sensitive     PIP/TAZO 32 INTERMEDIATE Intermediate     TOBRAMYCIN <=1 SENSITIVE Sensitive     TRIMETH/SULFA Value in next row Sensitive      <=20 SENSITIVE(NOTE)    * FEW ESCHERICHIA COLI  Culture, respiratory (NON-Expectorated)     Status: None   Collection Time: 01/02/15  3:32 PM  Result Value Ref Range Status   Specimen Description TRACHEAL ASPIRATE  Final   Special Requests Normal  Final   Gram Stain   Final    FEW WBC PRESENT, PREDOMINANTLY PMN NO SQUAMOUS EPITHELIAL CELLS SEEN RARE GRAM NEGATIVE RODS Performed at [facility]    Culture   Final    FEW ESCHERICHIA COLI Performed at [facility]    Report Status 01/05/2015 FINAL  Final   Organism ID, Bacteria ESCHERICHIA COLI  Final      Susceptibility   Escherichia coli - MIC*     AMPICILLIN >=32 RESISTANT Resistant     AMPICILLIN/SULBACTAM >=32 RESISTANT Resistant     CEFAZOLIN 8 RESISTANT Resistant     CEFEPIME <=1 SENSITIVE Sensitive     CEFTAZIDIME <=1 SENSITIVE Sensitive     CEFTRIAXONE <=1 SENSITIVE Sensitive     CIPROFLOXACIN <=0.25 SENSITIVE Sensitive     GENTAMICIN <=1 SENSITIVE Sensitive     IMIPENEM <=0.25 SENSITIVE Sensitive     PIP/TAZO >=128 RESISTANT Resistant     TOBRAMYCIN <=1 SENSITIVE Sensitive     TRIMETH/SULFA Value in next row Sensitive      <=20 SENSITIVE(NOTE)    * FEW ESCHERICHIA COLI    Medical History: Past Medical History  Diagnosis Date  . Hyperlipidemia   . Colon cancer ([place])   . Essential hypertension   . Diverticulosis   . Coronary artery disease     CABG in 2006 LIMA- LAD, SVG-1D, SVG-OM1, SVG-PDA  . Carotid artery occlusion     Occluded RICA, remote left CEA, left carotid stent July 2015 - Dr. Trula Slade  . Hypothyroidism   . PAD (peripheral artery disease) ([place])   . Renal insufficiency, mild   . History of stroke     Medications:  Anti-infectives    Start     Dose/Rate Route Frequency Ordered Stop   01/14/15 0100  vancomycin (VANCOCIN) IVPB 750 mg/150 ml premix     750 mg 150 mL/hr over 60 Minutes Intravenous Every 12 hours 01/13/15 1210     01/13/15 1230  vancomycin (VANCOCIN) 2,000 mg in sodium chloride 0.9 % 500 mL IVPB     2,000 mg 250 mL/hr over 120 Minutes Intravenous  Once 01/13/15 1210     01/13/15 0145  cefTAZidime (FORTAZ) 2 g in dextrose 5 % 50 mL IVPB     2 g 100 mL/hr over 30 Minutes Intravenous Every 12 hours 01/13/15 0135     01/12/15 1047  ceFAZolin (ANCEF) 2-3 GM-% IVPB SOLR    Comments:  Duininck, Stacey   : cabinet override      01/12/15 1047 01/12/15 2259   01/12/15 1000  ceFAZolin (ANCEF) IVPB 2 g/50 mL premix     2 g 100 mL/hr over 30 Minutes Intravenous To Radiology 01/12/15 0955 01/12/15 1140   01/04/15 1145  cefTRIAXone (ROCEPHIN) 1 g in dextrose 5 % 50 mL IVPB     1 g 100 mL/hr  over 30 Minutes Intravenous Every 24 hours 01/04/15 1133 01/10/15 1314   12/21/14 1000  Ampicillin-Sulbactam (UNASYN) 3 g in sodium chloride 0.9 % 100 mL IVPB     3 g 100 mL/hr over 60 Minutes Intravenous Every 8 hours 12/21/14 0900 12/31/14 0255     Assessment: 83 yom to start broad-spectrum antibiotics with vancomycin and ceftazidime for possible sepsis. Pt with Tmax of 102.9 and WBC is  WNL. Scr is elevated at 1.59.   Vanc 11/29>> Ceftaz 11/29>> Unasyn 11/6 >> 11/16 CTX 11/20 >> 11/27  11/6 RCx >> npof 11/6 MRSA PCR >> neg 11/17 & 11/18 TA >> few EColi (R-Unasyn)  Goal of Therapy:  Vancomycin trough level 15-20 mcg/ml  Plan:  - vancomycin 2gm IV x 1 then 750mg  IV Q12H - F/u renal fxn, C&S, clinical status and trough at [place], Rande Lawman 01/13/2015,12:12 PM

## 2015-01-13 NOTE — Plan of Care (Signed)
Problem: Nutrition: Goal: Dietary intake will improve Outcome: Progressing Pt has had PEG placement and is receiving tube feedings - Jevity 1.2 cal at 62ml/hr

## 2015-01-13 NOTE — Progress Notes (Signed)
Pt remains on ventilator; continue weaning attempts.  Hypotensive with fever overnight; started on Fortaz and Vancomycin for possible sepsis.  Continue to await insurance authorization for LTAC; hopeful for answer today, per [facility] with [facility].    Will follow progress.  Reinaldo Raddle, RN, BSN  Trauma/Neuro ICU Case Manager [phone]

## 2015-01-13 NOTE — Progress Notes (Signed)
Speech Language Pathology Treatment: Cognitive-Linquistic;Passy Muir Speaking valve  Patient Details Name: TREXTON STACHOWSKI MRN: [id number] DOB: 09-29-1931 Today's Date: 01/13/2015 Time: [id number] SLP Time Calculation (min) (ACUTE ONLY): 32 min  Assessment / Plan / Recommendation Clinical Impression  Session focused on PMV use and aphasia therapy.  Pt on ATC - cuff deflated with adequate toleration and limited secretions produced.  PMV placed for brief intervals due to difficulty following commands to open mouth for exhalation and increased WOB.  Pt able to achieve low volume/pitch, wet phonation intermittently with max verbal/tactile cues, but not able to access upper airway consistently.  Able to discriminate between two common objects by pointing; unable to discriminate when asked to choose among array of line drawings.  Followed one step commands with 80% accuracy.  With max cues for head nod/shake, pt responded to yes/no questions about biographical information with 75% accuracy.  Educated pt's wife re: function of PMV as well as nature of aphasia and our work towards Personal assistant.  Will continue to follow.     HPI HPI: 81 y.o. male transfer from [facility] after presenting with acute onset of right sided weakness and aphasia.MRI revealed evolving acute to subacute LEFT corona radiata/ internal capsule. PMH: CAD, CVA, renal insuff, colon cancer. Intubated 11/6 and received trach 11/18.      SLP Plan  Continue with current plan of care     Recommendations         Patient may use Passy-Muir Speech Valve: with SLP only PMSV Supervision: Full       Plan: Continue with current plan of care   [name] 01/13/2015, 5:08 PM  Jaxzen Vanhorn L. Tivis Ringer, [place] CCC/SLP Pager [phone]

## 2015-01-13 NOTE — Progress Notes (Signed)
Subjective: S/p 20 F pull through G-tube by IR 11/28, patient on vent, overnight temp of 102.9  Allergies: Simvastatin  Medications: Prior to Admission medications   Medication Sig Start Date End Date Taking? Authorizing Provider  ALPRAZolam (XANAX) 0.25 MG tablet Take 0.25 mg by mouth at bedtime as needed.   Yes Historical Provider, MD  aspirin (ASPIRIN EC) 81 MG EC tablet Take 81 mg by mouth daily. Swallow whole.   Yes Historical Provider, MD  atenolol (TENORMIN) 25 MG tablet Take 1 tablet (25 mg total) by mouth daily. 11/12/14  Yes Carlena Bjornstad, MD  atorvastatin (LIPITOR) 80 MG tablet Take 80 mg by mouth daily.   Yes Historical Provider, MD  clopidogrel (PLAVIX) 75 MG tablet take 1 tablet by mouth once daily 07/21/14  Yes Serafina Mitchell, MD  furosemide (LASIX) 40 MG tablet Take 40 mg by mouth. Take one tab every morning & 1/2 tab around 2 pm 06/05/13  Yes Historical Provider, MD  iron polysaccharides (NIFEREX) 150 MG capsule Take 150 mg by mouth daily.   Yes Historical Provider, MD  levothyroxine (SYNTHROID, LEVOTHROID) 150 MCG tablet Take 150 mcg by mouth every morning.   Yes Historical Provider, MD  losartan (COZAAR) 50 MG tablet Take 50 mg by mouth daily.   Yes Historical Provider, MD  NIFEdipine (PROCARDIA XL/ADALAT-CC) 60 MG 24 hr tablet Take 60 mg by mouth daily.   Yes Historical Provider, MD  sertraline (ZOLOFT) 100 MG tablet Take 100 mg by mouth daily.   Yes Historical Provider, MD  traZODone (DESYREL) 150 MG tablet Take 150 mg by mouth at bedtime.   Yes Historical Provider, MD  testosterone cypionate (DEPOTESTOTERONE CYPIONATE) 200 MG/ML injection Inject 100 mg into the muscle every 14 (fourteen) days.  04/22/13   Historical Provider, MD   Vital Signs: BP 90/51 mmHg  Pulse 78  Temp(Src) 97.6 F (36.4 C) (Axillary)  Resp 16  Ht 5\' 9"  (1.753 m)  Wt 206 lb 12.7 oz (93.8 kg)  BMI 30.52 kg/m2  SpO2 93%  Physical Exam General: Vent Abd: G-tube site dressing C/D/I,  distended, soft, no signs of infection   Imaging: Ir Gastrostomy Tube Mod Sed  01/12/2015  CLINICAL DATA:  Stroke EXAM: PERCUTANEOUS GASTROSTOMY FLUOROSCOPY TIME:  3 minutes and 42 seconds MEDICATIONS AND MEDICAL HISTORY: Versed 1 mg, Fentanyl 75 mcg. Glucagon 1 mg. ANESTHESIA/SEDATION: Moderate sedation time: 25 minutes CONTRAST:  None PROCEDURE: The procedure, risks, benefits, and alternatives were explained to the patient. Questions regarding the procedure were encouraged and answered. The patient understands and consents to the procedure. The epigastrium was prepped with Betadine in a sterile fashion, and a sterile drape was applied covering the operative field. A sterile gown and sterile gloves were used for the procedure. A 5-French orogastric tube is placed under fluoroscopic guidance. Scout imaging of the abdomen confirms barium within the transverse colon. The stomach was distended with gas. Under fluoroscopic guidance, an 18 gauge needle was utilized to puncture the anterior wall of the body of the stomach. An Amplatz wire was advanced through the needle passing a T fastener into the lumen of the stomach. The T fastener was secured for gastropexy. A 9-French sheath was inserted. A snare was advanced through the 9-French sheath. A Britta Mccreedy was advanced through the orogastric tube. It was snared then pulled out the oral cavity, pulling the snare, as well. The leading edge of the gastrostomy was attached to the snare. It was then pulled down the esophagus and  out the percutaneous site. It was secured in place. Contrast was injected. COMPLICATIONS: None FINDINGS: The image demonstrates placement of a 20-French pull-through type gastrostomy tube into the body of the stomach. IMPRESSION: Successful 20 French pull-through gastrostomy. Electronically Signed   By: Marybelle Killings M.D.   On: 01/12/2015 13:00   Dg Chest Port 1 View  01/10/2015  CLINICAL DATA:  Risk for aspiration.  Tube feedings. EXAM: PORTABLE  CHEST 1 VIEW COMPARISON:  Radiograph 01/10/2015 FINDINGS: Tracheostomy tube unchanged. Interval removal of feeding tube. Stable cardiac silhouette. There is perihilar airspace densities not changed from prior. LEFT basilar atelectasis. IMPRESSION: No change in perihilar airspace densities. LEFT basilar atelectasis. Electronically Signed   By: Suzy Bouchard M.D.   On: 01/10/2015 14:15   Dg Chest Port 1 View  01/10/2015  CLINICAL DATA:  Acute respiratory failure EXAM: PORTABLE CHEST 1 VIEW COMPARISON:  01/09/2015 FINDINGS: Tracheostomy tube, feeding tube unchanged. Stable cardiac silhouette. There is diffuse fine airspace disease not changed from prior. LEFT basilar atelectasis small effusion. No pneumothorax IMPRESSION: 1. No interval change in diffuse airspace disease suggesting pulmonary edema. 2. Stable support apparatus. Electronically Signed   By: Suzy Bouchard M.D.   On: 01/10/2015 10:01   Dg Abd Portable 1v  01/10/2015  CLINICAL DATA:  NG tube placement. EXAM: PORTABLE ABDOMEN - 1 VIEW COMPARISON:  Single view of the abdomen 01/07/2015. FINDINGS: NG tube is identified with the tip at the junction of the third and fourth segments of the duodenum. The bowel gas pattern is unremarkable. IMPRESSION: As above. Electronically Signed   By: Inge Rise M.D.   On: 01/10/2015 16:58    Labs:  CBC:  Recent Labs  01/08/15 0520 01/09/15 0539 01/12/15 0656 01/13/15 0213  WBC 8.6 9.8 8.3 11.2*  HGB 6.9* 7.3* 7.4* 7.2*  HCT 23.7* 24.9* 24.8* 25.2*  PLT 256 260 217 213    COAGS:  Recent Labs  12/21/14 0255 01/01/15 1454 01/12/15 0656  INR 1.18 1.27 1.27  APTT  --  44* 38*    BMP:  Recent Labs  01/11/15 0235 01/11/15 1405 01/12/15 0656 01/13/15 0213  NA 136 144 148* 148*  K 4.9 4.0 4.1 4.0  CL 108 111 112* 114*  CO2 23 28 29 28   GLUCOSE 125* 139* 119* 144*  BUN 17 48* 44* 48*  CALCIUM 8.7* 8.3* 8.6* 8.6*  CREATININE 1.35* 1.51* 1.47* 1.59*  GFRNONAA 48* 42* 43* 39*   GFRAA 55* 48* 50* 45*    LIVER FUNCTION TESTS:  Recent Labs  12/21/14 0255 12/22/14 0316  12/30/14 0333 12/31/14 0436 01/01/15 0323 01/03/15 0439  BILITOT 0.5 0.9  --   --   --   --   --   AST 17 23  --   --   --   --   --   ALT 13* 11*  --   --   --   --   --   ALKPHOS 60 51  --   --   --   --   --   PROT 6.0* 5.2*  --   --   --   --   --   ALBUMIN 3.0* 2.6*  < > 2.2* 2.1* 2.2* 2.1*  < > = values in this interval not displayed.  Assessment and Plan: CVA Dysphagia Protein calorie malnutrition S/p 20 F pull through G-tube 11/28 Spiked temp and wbc overnight, G-tube site without signs of infection and feedings started Follow temp, CXR ordered for  today per primary team    Signed: Tsosie Billing D 01/13/2015, 12:02 PM

## 2015-01-13 NOTE — Progress Notes (Signed)
PULMONARY  / CRITICAL CARE MEDICINE CONSULTATION   Name: Darius Barber MRN: [id number] DOB: 12-15-1931    ADMISSION DATE:  12/21/2014 CONSULTATION DATE: 12/21/2014  REQUESTING CLINICIAN: Dr. Leonie Man PRIMARY SERVICE: Neurology  CHIEF COMPLAINT:  Stroke  BRIEF PATIENT DESCRIPTION:  81 yo male from [place] with Rt sided weakness, and aphasia from CVA >> received tPA.  Developed progressive mental status change and intubated >> PCCM consulted.  SIGNIFICANT EVENTS and studies: 11/06 Admitted to hospital\ CT Head 11/7 - Left corona radiata infarct. No hemorrhage. Old infarcts.  TTE 11/7 - LV normal in size. Mild LVH. EF 55-60%. LA normal in size. RA normal in size. RV normal in size and function. No pericardial effusion. 11/19 MRI brain > evolving acute to subacute left corona radiata/internal capsule infarct without further propagation; chronically occluded R internal carotid with old R MCA territory infarct.   11/18 bedside tracheostomy Titus Mould) 11/20 tracheostomy site bleeding 11/21 Bleeding from trach >> hold ASA, plavix, SQ heparin 11/23 removed clot from tracheostomy 01/09/15: More awake and alert today; No weaning attempts in the last two days 01/10/15: Na improved. Nurse denies overnight issues. Wife at bedsie - indicates desire to move him to [facility]. PEG pending  SUBJECTIVE/OVERNIGHT/INTERVAL HX Failed weaning this AM.  Febrile and hypotensive overnight.  VITAL SIGNS: Temp:  [98.5 F (36.9 C)-102.9 F (39.4 C)] 99.1 F (37.3 C) (11/29 0737) Pulse Rate:  [57-103] 95 (11/29 1049) Resp:  [5-26] 23 (11/29 1049) BP: (75-159)/(48-92) 159/85 mmHg (11/29 1005) SpO2:  [91 %-100 %] 91 % (11/29 1049) FiO2 (%):  [40 %-60 %] 40 % (11/29 1049) Weight:  [93.8 kg (206 lb 12.7 oz)] 93.8 kg (206 lb 12.7 oz) (11/29 0500)  VENTILATOR SETTINGS: Vent Mode:  [-] PRVC FiO2 (%):  [40 %-60 %] 40 % Set Rate:  [15 bmp] 15 bmp Vt Set:  [600 mL] 600 mL PEEP:  [8 cmH20] 8 cmH20 Pressure Support:   [12 cmH20] 12 cmH20 Plateau Pressure:  [17 cmH20-29 cmH20] 17 cmH20   INTAKE / OUTPUT: Intake/Output      11/28 0701 - 11/29 0700 11/29 0701 - 11/30 0700   I.V. (mL/kg) 480 (5.1) 60 (0.6)   Other  210   NG/GT 470 367.5   IV Piggyback 550 50   Total Intake(mL/kg) 1500 (16) 687.5 (7.3)   Urine (mL/kg/hr) 1485 (0.7) 350 (0.8)   Stool 0 (0)    Total Output 1485 350   Net +15 +337.5        Stool Occurrence 1 x     PHYSICAL EXAMINATION:  Gen: chronically ill appearing, on vent HENT: Trach in place, no bleeding PULM: CTA bilaterally CV: RRR, no mgr GI: BS+, soft, nontender MSK: normal bulk and tone Neuro: awake, follows simple commands, Some squeeze and movement on left. Weak on right  LABS: PULMONARY No results for input(s): PHART, PCO2ART, PO2ART, HCO3, TCO2, O2SAT in the last 168 hours.  Invalid input(s): PCO2, PO2  CBC  Recent Labs Lab 01/09/15 0539 01/12/15 0656 01/13/15 0213  HGB 7.3* 7.4* 7.2*  HCT 24.9* 24.8* 25.2*  WBC 9.8 8.3 11.2*  PLT 260 217 213   COAGULATION  Recent Labs Lab 01/12/15 0656  INR 1.27   CARDIAC   Recent Labs Lab 01/11/15 1405 01/11/15 2134 01/12/15 0656 01/13/15 0213  TROPONINI 0.22* 0.22* 0.25* 0.31*   No results for input(s): PROBNP in the last 168 hours.  CHEMISTRY  Recent Labs Lab 01/10/15 0448 01/11/15 0235 01/11/15 1405 01/12/15 [id number] 01/13/15 [id number]  NA 146* 136 144 148* 148*  K 3.7 4.9 4.0 4.1 4.0  CL 111 108 111 112* 114*  CO2 27 23 28 29 28   GLUCOSE 163* 125* 139* 119* 144*  BUN 56* 17 48* 44* 48*  CREATININE 1.66* 1.35* 1.51* 1.47* 1.59*  CALCIUM 8.6* 8.7* 8.3* 8.6* 8.6*  MG  --   --  2.4 2.5* 2.5*  PHOS  --   --  3.6  --  3.6   Estimated Creatinine Clearance: 41.2 mL/min (by C-G formula based on Cr of 1.59).  LIVER  Recent Labs Lab 01/12/15 0656  INR 1.27   INFECTIOUS No results for input(s): LATICACIDVEN, PROCALCITON in the last 168 hours.   ENDOCRINE CBG (last 3)   Recent Labs   01/12/15 2344 01/13/15 0420 01/13/15 0736  GLUCAP 139* 132* 113*   IMAGING x48h  - image(s) personally visualized  -   highlighted in bold Ir Gastrostomy Tube Mod Sed  01/12/2015  CLINICAL DATA:  Stroke EXAM: PERCUTANEOUS GASTROSTOMY FLUOROSCOPY TIME:  3 minutes and 42 seconds MEDICATIONS AND MEDICAL HISTORY: Versed 1 mg, Fentanyl 75 mcg. Glucagon 1 mg. ANESTHESIA/SEDATION: Moderate sedation time: 25 minutes CONTRAST:  None PROCEDURE: The procedure, risks, benefits, and alternatives were explained to the patient. Questions regarding the procedure were encouraged and answered. The patient understands and consents to the procedure. The epigastrium was prepped with Betadine in a sterile fashion, and a sterile drape was applied covering the operative field. A sterile gown and sterile gloves were used for the procedure. A 5-French orogastric tube is placed under fluoroscopic guidance. Scout imaging of the abdomen confirms barium within the transverse colon. The stomach was distended with gas. Under fluoroscopic guidance, an 18 gauge needle was utilized to puncture the anterior wall of the body of the stomach. An Amplatz wire was advanced through the needle passing a T fastener into the lumen of the stomach. The T fastener was secured for gastropexy. A 9-French sheath was inserted. A snare was advanced through the 9-French sheath. A Britta Mccreedy was advanced through the orogastric tube. It was snared then pulled out the oral cavity, pulling the snare, as well. The leading edge of the gastrostomy was attached to the snare. It was then pulled down the esophagus and out the percutaneous site. It was secured in place. Contrast was injected. COMPLICATIONS: None FINDINGS: The image demonstrates placement of a 20-French pull-through type gastrostomy tube into the body of the stomach. IMPRESSION: Successful 20 French pull-through gastrostomy. Electronically Signed   By: Marybelle Killings M.D.   On: 01/12/2015 13:00   LINES /  TUBES: ETT 11/6 >> 11/18 Trach 11/18 >>  Rt radial aline 11/22 >>11/25  I reviewed CXR myself, trach ok.  DISCUSSION: 81 y/o male with acute left internal capsule infarct with baseline old R MCA stroke admitted on 11/6 and developed confusion and respiratory failure from aspiration/ecoli pneumonia.  Tracheostomy placed 11/18, has been slow to wean since.  Not LTAC candidate until 21 days on ventilator have passed.  Has ARDS, slow to improve.  ASSESSMENT / PLAN:  PULMONARY A: ARDS due to HCAP, PEEP remains at 8 and FiO2 at 50%.  Failure to wean from vent s/p tracheostomy. Tracheostomy site bleeding >> resolved  P:   Repeat PS trials again as able. Hold ASA/Plavix/Sub cutaneous heparin due to tracheostomy bleeding, will restart 11/29 (PEG to be placed on 11/28).  CARDIOVASCULAR A:  Intermittent VTach Labile BP Hx of HTN, HLD, CAD s/p CABG, PAD Troponin rising.  P:  Tele monitroing PRN labetalol Lipitor daily Cardiology consult called.  RENAL A:   Hypernatremia> worse Chronic kidney disease, Baseline Cr ~1.7-2.0  P:   Continue free water to 300 q4. Monitor BMET and UOP. Replace electrolytes as needed.  GASTROINTESTINAL A:   Protein calorie malnutrition Dysphagia related to vent, stroke  P:   PEG placed 11/28. Protonix for SUP. Resume TF via PEG.  HEMATOLOGIC A:   Bleeding from tracheostomy >> improved 11/25 Anemia from blood loss, and critical illness  P:  Hold ASA, Plavix, sub cutaneous heparin restart 11/29 after PEG placement. F/u CBC   INFECTIOUS A:   CULTURES: Tracheal Aspirate (11/6):  Oral Flora 11/18 resp >> Ecoli amp resistant HCAP> E Coli Febrile and hypotensive overnight  P:   ANTIBIOTICS: Unasyn 11/6>>11/16 Ceftriaxone 11/20 >>  LAST DAY 01/11/15 Ceftaz 11/28>>>  CXR today to evaluate for new infiltrate. F/U on cultures. Restart Vanc.  ENDOCRINE A:   Hyperglycemia P:   Sub cutaneous insulin, sliding  scale  NEUROLOGIC A:   Acute left internal capsule and corona radiata stroke > per neurology he may be able to ambulate with support with rehab Baseline old R MCA stroke Normopressure hydrocephalus (changes on MRI/CT) Encephalopathy> ICU related, follows commands but does not answer CAM-ICU questions appropriately  P:   RASS goal: 0 PRN fentanyl  GOALS of care 01/09/15 by PCCM MD Dr Lake Bells:  I had a lengthy conversation with his family today (nearly one hour) during which time I explained that he may recover neurologically to a point where he can walk with a walker, but that his overall prognosis is poor given his ARDS, prolonged vent support, recurrent infections, CKD, and advanced age.  They voiced understanding and I answered many questions they had.  They will talk to him today about code status, going to [place].  01/10/15- wife wants him at [facility] after 21 days of hospitalization.  Poor prognosis.  Daughter updated bedside 11/29.  The patient is critically ill with multiple organ systems failure and requires high complexity decision making for assessment and support, frequent evaluation and titration of therapies, application of advanced monitoring technologies and extensive interpretation of multiple databases.   Critical Care Time devoted to patient care services described in this note is  35  Minutes. This time reflects time of care of this signee Dr Jennet Maduro. This critical care time does not reflect procedure time, or teaching time or supervisory time of PA/NP/Med student/Med Resident etc but could involve care discussion time.  Rush Farmer, M.D. [facility]/Critical Care Medicine. Pager: [phone]. After hours pager: [phone].  01/13/2015 11:36 AM

## 2015-01-13 NOTE — Progress Notes (Signed)
Pt pulled Panda Tube (Cotrax) out. TF stopped. Pt had peg placement yesterday. Will pass onto oncoming RN.

## 2015-01-13 NOTE — Progress Notes (Signed)
Patient Name: Darius Barber Date of Encounter: 01/13/2015  Active Problems:   CVA (cerebral infarction)   Aspiration pneumonia (HCC)   Hypomagnesemia   Cerebrovascular accident (CVA) due to thrombosis of precerebral artery (HCC)   HLD (hyperlipidemia)   Coronary artery disease involving coronary bypass graft of native heart with unspecified angina pectoris   Cerebral infarction due to occlusion of right carotid artery (HCC)   FHx: carotid endarterectomy   Ventilator dependence ([place])   Acute respiratory failure with hypoxemia [facility])   Dysphagia   Primary Physician: Curlene Labrum, MD Primary Cardiologist: Dr. Domenic Polite  Interval History Since Initial Consult on 12/23/2014: Darius Barber is a 81 y.o. male with past medical history of CAD (s/p CABG in 2006 with LIMA-LAD, SVG-D1, SVG-OM1, and SVG-PDA), Carotid artery occlusion (s/p L stent 2015), PAD, HLD, and HTN who presented to [facility] on 12/21/2014 for presumed CVA. At time of admission, he developed altered mental status and acute respiratory failure and had to be emergently intubated.   He was diagnosed with a left corona radiata infarct by CT imaging on 12/22/2014. An echocardiogram was also obtained on 12/22/2014 which showed an EF of 55% to 60%. His PA peak pressure was elevated to 65 mm Hg (S).  Cardiology was initially consulted on 12/23/2014 for elevated troponin. His cyclic troponin values have been 0.08, 0.09, 2.32, and 1.74. His EKG showed NSR with rate in 50's and no acute ST or T wave changes to suggest ischemia. His troponin elevation at that time was thought to be demand ischemia in the setting of his acute illness and no further workup was indicated at that time.  Since 12/23/2014, he has underwent several trials of extubation which were unsuccessful. He required tracheostomy placement on 01/02/2015 and experienced significant bleeding on 01/04/2015 following placement. His hemoglobin reached a trough of 6.9 on  01/08/2015.   His Plavix was discontinued intermittently on 01/05/2015 for safe G-tube placement and due to his tracheostomy site bleeding. His PEG tube was placed on 01/12/2015 and they are planning to resume Plavix today.  His troponin values peaked at 2.32 on 12/23/2014 and have continued to trend down since. His troponin value on 01/13/2015 is 0.31. He has been having episodes of NSVT on telemetry for up to 3 beats. His wife reports he has not expressed having any active pain.   SUBJECTIVE: Alert. Following commands. Wife present during encounter. Says he has not   OBJECTIVE Filed Vitals:   01/13/15 1400 01/13/15 1500 01/13/15 1553 01/13/15 1600  BP: 106/56 106/57  126/66  Pulse: 71 69  84  Temp:   97.6 F (36.4 C)   TempSrc:   Axillary   Resp: 18 15  20   Height:      Weight:      SpO2: 100% 100%  97%    Intake/Output Summary (Last 24 hours) at 01/13/15 1609 Last data filed at 01/13/15 1500  Gross per 24 hour  Intake 2777.5 ml  Output   1275 ml  Net 1502.5 ml   Filed Weights   01/11/15 0500 01/12/15 0428 01/13/15 0500  Weight: 211 lb 6.7 oz (95.9 kg) 206 lb 9.1 oz (93.7 kg) 206 lb 12.7 oz (93.8 kg)    PHYSICAL EXAM General: Elderly Caucasian male in no acute distress. Head: Normocephalic, atraumatic.  Neck: Supple without bruits, Tracheostomy in place. Lungs:  Resp regular and unlabored. Heart: RRR, S1, S2, no S3, S4, or murmur; no rub. Abdomen: Soft, non-tender, non-distended with  normoactive bowel sounds. No hepatomegaly. No rebound/guarding. No obvious abdominal masses. G-tube dressing in place. Extremities: No clubbing or cyanosis. 1+ pitting edema bilaterally. Distal pedal pulses are 2+ bilaterally. Neuro: Alert and oriented X 3. Following commands. Psych: Normal affect.  LABS: CBC: Recent Labs  01/12/15 0656 01/13/15 0213  WBC 8.3 11.2*  HGB 7.4* 7.2*  HCT 24.8* 25.2*  MCV 98.8 98.8  PLT 217 213   INR: Recent Labs  01/12/15 0656  INR [id number]    Basic Metabolic Panel: Recent Labs  01/11/15 1405 01/12/15 0656 01/13/15 0213  NA 144 148* 148*  K 4.0 4.1 4.0  CL 111 112* 114*  CO2 28 29 28   GLUCOSE 139* 119* 144*  BUN 48* 44* 48*  CREATININE 1.51* 1.47* 1.59*  CALCIUM 8.3* 8.6* 8.6*  MG 2.4 2.5* 2.5*  PHOS 3.6  --  3.6   Cardiac Enzymes: Recent Labs  01/11/15 2134 01/12/15 0656 01/13/15 0213  TROPONINI 0.22* 0.25* 0.31*    TELE: NSR with rate in 70's - low 100's. Episodes of NSVT up to 3 beats.  ECHO: 12/22/2014 Study Conclusions  - Left ventricle: The cavity size was normal. Wall thickness was increased in a pattern of mild LVH. Systolic function was normal. The estimated ejection fraction was in the range of 55% to 60%. - Mitral valve: There was mild regurgitation. - Pulmonary arteries: PA peak pressure: 65 mm Hg (S).  Radiology/Studies: Ir Gastrostomy Tube Mod Sed: 01/12/2015  CLINICAL DATA:  Stroke EXAM: PERCUTANEOUS GASTROSTOMY FLUOROSCOPY TIME:  3 minutes and 42 seconds MEDICATIONS AND MEDICAL HISTORY: Versed 1 mg, Fentanyl 75 mcg. Glucagon 1 mg. ANESTHESIA/SEDATION: Moderate sedation time: 25 minutes CONTRAST:  None PROCEDURE: The procedure, risks, benefits, and alternatives were explained to the patient. Questions regarding the procedure were encouraged and answered. The patient understands and consents to the procedure. The epigastrium was prepped with Betadine in a sterile fashion, and a sterile drape was applied covering the operative field. A sterile gown and sterile gloves were used for the procedure. A 5-French orogastric tube is placed under fluoroscopic guidance. Scout imaging of the abdomen confirms barium within the transverse colon. The stomach was distended with gas. Under fluoroscopic guidance, an 18 gauge needle was utilized to puncture the anterior wall of the body of the stomach. An Amplatz wire was advanced through the needle passing a T fastener into the lumen of the stomach. The T fastener  was secured for gastropexy. A 9-French sheath was inserted. A snare was advanced through the 9-French sheath. A Britta Mccreedy was advanced through the orogastric tube. It was snared then pulled out the oral cavity, pulling the snare, as well. The leading edge of the gastrostomy was attached to the snare. It was then pulled down the esophagus and out the percutaneous site. It was secured in place. Contrast was injected. COMPLICATIONS: None FINDINGS: The image demonstrates placement of a 20-French pull-through type gastrostomy tube into the body of the stomach. IMPRESSION: Successful 20 French pull-through gastrostomy. Electronically Signed   By: Marybelle Killings M.D.   On: 01/12/2015 13:00   Dg Chest Port 1 View: 01/13/2015  CLINICAL DATA:  Fever. EXAM: PORTABLE CHEST 1 VIEW COMPARISON:  January 10, 2015. FINDINGS: Stable cardiomediastinal silhouette. Status post coronary artery bypass graft. Tracheostomy tube is in grossly good position. No pneumothorax is noted. Slightly increased bilateral interstitial lung opacities are noted, with right greater than left. This is concerning for edema. Increased right pleural effusion is noted as well. Bony thorax is unremarkable.  IMPRESSION: Slightly increased bilateral interstitial lung densities are noted most consistent with edema. Mildly increased right pleural effusion is noted. Electronically Signed   By: Marijo Conception, M.D.   On: 01/13/2015 13:35     Current Medications:  .  stroke: mapping our early stages of recovery book   Does not apply Once  . antiseptic oral rinse  7 mL Mouth Rinse 10 times per day  . atorvastatin  80 mg Per Tube q1800  . bethanechol  5 mg Per Tube TID  . cefTAZidime (FORTAZ)  IV  2 g Intravenous Q12H  . chlorhexidine gluconate  15 mL Mouth Rinse BID  . feeding supplement (PRO-STAT SUGAR FREE 64)  30 mL Per Tube TID  . free water  300 mL Per Tube Q4H  . insulin aspart  0-9 Units Subcutaneous 6 times per day  . levothyroxine  150 mcg Per Tube  QAC breakfast  . pantoprazole sodium  40 mg Per Tube Daily  . [START ON 01/14/2015] vancomycin  750 mg Intravenous Q12H   . sodium chloride 20 mL/hr at 01/13/15 1600  . feeding supplement (JEVITY 1.2 CAL) 1,000 mL (01/13/15 1000)    ASSESSMENT AND PLAN:  1. Elevated Troponin - history of CAD s/p CABG 2006. Also had known Carotid Artery Occlusion s/p stent in 2015.  - troponin values peaked at 2.32 on 12/23/2014 and have continued to trend down since. His troponin value on 01/13/2015 is 0.31.   - having episodes of NSVT on telemetry for up to 3 beats per occurrence. His wife reports he has not expressed having any active pain. - will restart home Atenolol 25mg  daily. Will restart ASA and Plavix (Ok per Critical Care notes as well since he is now s/p G-tube placement).   2. AMS - currently resolved.  - management per admitting team   3. Aspiration PNA - on Abx treatment - management per primary team  4. HLD - continue statin  5. HTN - BP has been 84/49 - 159/91 in the past 24 hours. - Continue to monitor.  Arna Medici , PA-C 4:09 PM 01/13/2015 Pager: [phone] Patient seen and examined and history reviewed. Agree with above findings and plan. Please refer to extensive consult note from Dr. Gwenlyn Found on 12/23/14 for elevated troponin. Troponin levels have trended down. No new Ecg changes. ASA and Plavix held for bleeding around trach that has resolved. Was on Atenolol but this was stopped on 11/20 for unclear reasons. He is having some brief ectopy on telemetry. No cardiac symptoms. He is s/p PEG placement yesterday. If OK to use PEG would resume ASA and Plavix now. Would resume atenolol at 25 mg daily. No other cardiac evaluation necessary at this time.  Damyia Strider [place], [place] 01/13/2015 5:02 PM

## 2015-01-14 ENCOUNTER — Inpatient Hospital Stay (HOSPITAL_COMMUNITY): Payer: Medicare HMO

## 2015-01-14 DIAGNOSIS — Z930 Tracheostomy status: Secondary | ICD-10-CM

## 2015-01-14 LAB — CBC
HEMATOCRIT: 26.3 % — AB (ref 39.0–52.0)
Hemoglobin: 7.6 g/dL — ABNORMAL LOW (ref 13.0–17.0)
MCH: 28.6 pg (ref 26.0–34.0)
MCHC: 28.9 g/dL — AB (ref 30.0–36.0)
MCV: 98.9 fL (ref 78.0–100.0)
Platelets: 192 10*3/uL (ref 150–400)
RBC: 2.66 MIL/uL — ABNORMAL LOW (ref 4.22–5.81)
RDW: 20.9 % — AB (ref 11.5–15.5)
WBC: 10.6 10*3/uL — ABNORMAL HIGH (ref 4.0–10.5)

## 2015-01-14 LAB — GLUCOSE, CAPILLARY
GLUCOSE-CAPILLARY: 134 mg/dL — AB (ref 65–99)
GLUCOSE-CAPILLARY: 136 mg/dL — AB (ref 65–99)
Glucose-Capillary: 150 mg/dL — ABNORMAL HIGH (ref 65–99)
Glucose-Capillary: 147 mg/dL — ABNORMAL HIGH (ref 65–99)
Glucose-Capillary: 145 mg/dL — ABNORMAL HIGH (ref 65–99)
Glucose-Capillary: 134 mg/dL — ABNORMAL HIGH (ref 65–99)

## 2015-01-14 LAB — BASIC METABOLIC PANEL
Anion gap: 7 (ref 5–15)
BUN: 50 mg/dL — ABNORMAL HIGH (ref 6–20)
CALCIUM: 8.5 mg/dL — AB (ref 8.9–10.3)
CO2: 26 mmol/L (ref 22–32)
CREATININE: 1.45 mg/dL — AB (ref 0.61–1.24)
Chloride: 110 mmol/L (ref 101–111)
GFR calc Af Amer: 51 mL/min — ABNORMAL LOW (ref >60)
GFR calc non Af Amer: 44 mL/min — ABNORMAL LOW (ref >60)
GLUCOSE: 154 mg/dL — AB (ref 65–99)
Potassium: 4.9 mmol/L (ref 3.5–5.1)
Sodium: 143 mmol/L (ref 135–145)

## 2015-01-14 LAB — PHOSPHORUS: Phosphorus: 3.3 mg/dL (ref 2.5–4.6)

## 2015-01-14 LAB — MAGNESIUM: Magnesium: 2.6 mg/dL — ABNORMAL HIGH (ref 1.7–2.4)

## 2015-01-14 MED ORDER — FUROSEMIDE 10 MG/ML IJ SOLN
40.0000 mg | Freq: Three times a day (TID) | INTRAMUSCULAR | Status: AC
  Administered 2015-01-14 (×2): 40 mg via INTRAVENOUS
  Filled 2015-01-14 (×2): qty 4

## 2015-01-14 NOTE — Progress Notes (Signed)
RT note-Patient weaned on PSV 12/4 until this time, patient appears and nodded that he was tired. Placed back to full support, will attempt ATC this afternoon.

## 2015-01-14 NOTE — Progress Notes (Signed)
RT note-Placed on wean this AM, still has increased work of breathing, ATC or PS weaning. Will wean and monitor at this time, with increased PS, and attempt ATC later this morning.

## 2015-01-14 NOTE — Progress Notes (Signed)
Patient Name: Darius Barber Date of Encounter: 01/14/2015  Active Problems:   CVA (cerebral infarction)   Aspiration pneumonia (HCC)   Hypomagnesemia   Cerebrovascular accident (CVA) due to thrombosis of precerebral artery (HCC)   HLD (hyperlipidemia)   Coronary artery disease involving coronary bypass graft of native heart with unspecified angina pectoris   Cerebral infarction due to occlusion of right carotid artery (HCC)   FHx: carotid endarterectomy   Ventilator dependence ([place])   Acute respiratory failure with hypoxemia [facility])   Dysphagia    Primary Cardiologist: Dr. Domenic Polite Patient Profile: Darius Barber is a 81 y.o. male with past medical history of CAD (s/p CABG in 2006 with LIMA-LAD, SVG-D1, SVG-OM1, and SVG-PDA), Carotid artery occlusion (s/p L stent 2015), PAD, HLD, and HTN who presented to [facility] on 12/21/2014 admitted for CVA. Required emergent intubation due to acute respiratory failure. Trach placed on 01/02/2015. PEG placed 01/12/2015. Troponin peaked at 2.32 on 12/23/2014.  SUBJECTIVE: Trach in place. Following commands. Shakes his head no when asked about his chest pain or palpitations.  OBJECTIVE Filed Vitals:   01/14/15 0500 01/14/15 0600 01/14/15 0700 01/14/15 0812  BP: 113/48 134/76 121/67   Pulse: 80 91 77   Temp:    99.2 F (37.3 C)  TempSrc:    Oral  Resp: 18 22 16    Height:      Weight:      SpO2: 93% 94% 94%     Intake/Output Summary (Last 24 hours) at 01/14/15 [id number] Last data filed at 01/14/15 0700  Gross per 24 hour  Intake 4242.5 ml  Output   1855 ml  Net 2387.5 ml   Filed Weights   01/12/15 0428 01/13/15 0500 01/14/15 0442  Weight: 206 lb 9.1 oz (93.7 kg) 206 lb 12.7 oz (93.8 kg) 205 lb 4 oz (93.1 kg)    PHYSICAL EXAM General: Elderly Caucasian male in no acute distress. Head: Normocephalic, atraumatic.  Neck: Supple without bruits, JVD not elevated. Trach in place. Lungs:  Resp regular and unlabored, CTA without wheezing  or rales. Heart: RRR, S1, S2, no S3, S4, or murmur; no rub. Abdomen: Soft, non-tender, non-distended with normoactive bowel sounds. No hepatomegaly. No rebound/guarding. No obvious abdominal masses. PEG tube in place. Extremities: No clubbing or cyanosis. 1+ edema bilaterally. Distal pedal pulses are 2+ bilaterally. Neuro: Alert and oriented X 3. Following commands. Psych: Normal affect.  LABS: CBC: Recent Labs  01/13/15 0213 01/14/15 0357  WBC 11.2* 10.6*  HGB 7.2* 7.6*  HCT 25.2* 26.3*  MCV 98.8 98.9  PLT 213 192   INR: Recent Labs  01/12/15 0656  INR [id number]   Basic Metabolic Panel: Recent Labs  01/13/15 0213 01/14/15 0357  NA 148* 143  K 4.0 4.9  CL 114* 110  CO2 28 26  GLUCOSE 144* 154*  BUN 48* 50*  CREATININE 1.59* 1.45*  CALCIUM 8.6* 8.5*  MG 2.5* 2.6*  PHOS 3.6 3.3   Cardiac Enzymes: Recent Labs  01/11/15 2134 01/12/15 0656 01/13/15 0213  TROPONINI 0.22* 0.25* 0.31*   TELE:  NSR with rate in 70's - 90's. Only one episode of 2 beats NSVT in the past 24 hours.    ECHO: 12/22/2014 Study Conclusions - Left ventricle: The cavity size was normal. Wall thickness was increased in a pattern of mild LVH. Systolic function was normal. The estimated ejection fraction was in the range of 55% to 60%. - Mitral valve: There was mild regurgitation. - Pulmonary arteries:  PA peak pressure: 65 mm Hg (S).  Radiology/Studies: Ir Gastrostomy Tube Mod Sed: 01/12/2015  CLINICAL DATA:  Stroke EXAM: PERCUTANEOUS GASTROSTOMY FLUOROSCOPY TIME:  3 minutes and 42 seconds MEDICATIONS AND MEDICAL HISTORY: Versed 1 mg, Fentanyl 75 mcg. Glucagon 1 mg. ANESTHESIA/SEDATION: Moderate sedation time: 25 minutes CONTRAST:  None PROCEDURE: The procedure, risks, benefits, and alternatives were explained to the patient. Questions regarding the procedure were encouraged and answered. The patient understands and consents to the procedure. The epigastrium was prepped with Betadine in a sterile  fashion, and a sterile drape was applied covering the operative field. A sterile gown and sterile gloves were used for the procedure. A 5-French orogastric tube is placed under fluoroscopic guidance. Scout imaging of the abdomen confirms barium within the transverse colon. The stomach was distended with gas. Under fluoroscopic guidance, an 18 gauge needle was utilized to puncture the anterior wall of the body of the stomach. An Amplatz wire was advanced through the needle passing a T fastener into the lumen of the stomach. The T fastener was secured for gastropexy. A 9-French sheath was inserted. A snare was advanced through the 9-French sheath. A Britta Mccreedy was advanced through the orogastric tube. It was snared then pulled out the oral cavity, pulling the snare, as well. The leading edge of the gastrostomy was attached to the snare. It was then pulled down the esophagus and out the percutaneous site. It was secured in place. Contrast was injected. COMPLICATIONS: None FINDINGS: The image demonstrates placement of a 20-French pull-through type gastrostomy tube into the body of the stomach. IMPRESSION: Successful 20 French pull-through gastrostomy. Electronically Signed   By: Marybelle Killings M.D.   On: 01/12/2015 13:00   Dg Chest Port 1 View: 01/14/2015  CLINICAL DATA:  Aspiration pneumonia, previous CVA, history of coronary artery disease and CABG. EXAM: PORTABLE CHEST 1 VIEW COMPARISON:  Portable chest x-ray of January 13, 2015 FINDINGS: The bilateral alveolar opacities have become more confluent. The hemidiaphragms remain largely obscured especially on the left. The cardiac silhouette remains enlarged. The pulmonary vascularity is indistinct. The endotracheal tube tip lies 5 mm above the superior margin of the clavicles. IMPRESSION: Further interval deterioration in the appearance of the lungs with progressive alveolar filling processes consistent with pulmonary edema and/or pneumonia. Electronically Signed   By:  David  Martinique M.D.   On: 01/14/2015 07:23    Current Medications:  .  stroke: mapping our early stages of recovery book   Does not apply Once  . antiseptic oral rinse  7 mL Mouth Rinse 10 times per day  . aspirin  81 mg Per Tube Daily  . atenolol  25 mg Per Tube Daily  . atorvastatin  80 mg Per Tube q1800  . bethanechol  5 mg Per Tube TID  . cefTAZidime (FORTAZ)  IV  2 g Intravenous Q12H  . chlorhexidine gluconate  15 mL Mouth Rinse BID  . clopidogrel  75 mg Per Tube Daily  . feeding supplement (PRO-STAT SUGAR FREE 64)  30 mL Per Tube TID  . free water  300 mL Per Tube Q4H  . furosemide  40 mg Intravenous Q8H  . insulin aspart  0-9 Units Subcutaneous 6 times per day  . levothyroxine  150 mcg Per Tube QAC breakfast  . pantoprazole sodium  40 mg Per Tube Daily  . vancomycin  750 mg Intravenous Q12H   . sodium chloride 20 mL/hr at 01/13/15 2000  . feeding supplement (JEVITY 1.2 CAL) 1,000 mL (01/14/15 0839)  ASSESSMENT AND PLAN:  1. Elevated Troponin - history of CAD s/p CABG 2006. Also had known Carotid Artery Occlusion s/p stent in 2015.  - troponin values peaked at 2.32 on 12/23/2014 and have continued to trend down since. His troponin value on 01/13/2015 is 0.31.  - having episodes of NSVT on telemetry for up to 3 beats per occurrence. His wife reports he has not expressed having any active pain. - ASA, Plavix, and BB have been resumed.  2. AMS - currently resolved.  - management per admitting team   3. Aspiration PNA/ Acute Respiratory Failure - on Abx treatment - CXR on 01/14/2015 showed increased pulmonary edema. Lasix 40mg  IV Q8H has been initiated.  4. HLD - continue statin  5. HTN - BP has been 81/48 - 159/85 in the past 24 hours. - Continue to monitor.  Arna Medici , PA-C 9:38 AM 01/14/2015 Pager: [phone] Patient seen and examined and history reviewed. Agree with above findings and plan. Doing well from a cardiac standpoint.  Still having some PVCs and PACs but no significant ectopy. Antiplatelet and beta blocker therapy resumed. No further recs from a cardiac standpoint. We will sign off.  Kenyata Guess [place], [place] 01/14/2015 2:12 PM

## 2015-01-14 NOTE — Progress Notes (Signed)
RT note- Patient up in chair, ATC placed at 60%. Moderate work of breathing.

## 2015-01-14 NOTE — Progress Notes (Addendum)
Occupational Therapy Treatment Patient Details Name: Darius Barber MRN: [id number] DOB: 30-Jan-1932 Today's Date: 01/14/2015    History of present illness pt presents with L Corona Radiata Infarct with concern for aspiration causing Hypoxic Respiratory Failure, which required intubation on 11/6.  pt with hx of CAD, CABG, CVA, and Colon CA.   OT comments  Pt fatigued to day after TC trials but participated well with OT at bed level. Completing LUE/LE strengthening ex. Able to follow commands with LUE. Able to wash face after set up. This is improvement since last OT session. Discussed recommendation for R resting hand splint and PRAFO with nsg. Also recommended pt be in chair position in bed BID for 1 hr each if not OOB in chair. Will reassess goals next session.   Follow Up Recommendations  LTACH    Equipment Recommendations  3 in 1 bedside comode;Tub/shower bench    Recommendations for Other Services      Precautions / Restrictions Precautions Precautions: Fall Precaution Comments: trach/vent/peg Restrictions Weight Bearing Restrictions: No       Mobility Bed Mobility               General bed mobility comments: seen at bed level  Transfers Overall transfer level: Needs assistance Equipment used: 2 person hand held assist             General transfer comment: Attempted weight shifting in sitting anteriorly over LEs to promote WBing, however pt not A with mobility and required total A.      Balance Overall balance assessment: Needs assistance Sitting-balance support: No upper extremity supported;Feet supported Sitting balance-Leahy Scale: Zero Sitting balance - Comments: Attempted weight shifting in sitting and having L UE reaching, however pt only attempted to follow direction x1 then required total A to weight shift and reach with L UE.                             ADL         Grooming Details (indicate cue type and reason): Able to wash face  using L hand                                      Vision                 Additional Comments: R bias   Perception     Praxis      Cognition   Behavior During Therapy: Flat affect Overall Cognitive Status: Difficult to assess Area of Impairment: Following commands        Following Commands: Follows one step commands inconsistently;Follows one step commands with increased time       General Comments: pt seems to fatigue quickly and stops following directions.      Extremity/Trunk Assessment               Exercises General Exercises - Upper Extremity Shoulder Flexion: Left;AROM;AAROM;15 reps;Supine Shoulder ABduction: AROM;AAROM;Left;15 reps;Supine Shoulder ADduction: AROM;AAROM;Left;15 reps;Supine Elbow Flexion: AROM;AAROM;Left;15 reps Elbow Extension: AROM;AAROM;Left;15 reps;Supine Digit Composite Flexion: AROM;Left;10 reps Composite Extension: AROM;Left;10 reps Other Exercises Other Exercises: RUE PROM   Shoulder Instructions       General Comments      Pertinent Vitals/ Pain       Pain Assessment: Faces Faces Pain Scale: Hurts a little bit Pain Location: RUE with ROM Pain Descriptors /  Indicators: Grimacing Pain Intervention(s): Limited activity within patient's tolerance  Home Living                                          Prior Functioning/Environment              Frequency Min 2X/week     Progress Toward Goals  OT Goals(current goals can now be found in the care plan section)  Progress towards OT goals: Progressing toward goals;OT to reassess next treatment  Acute Rehab OT Goals Patient Stated Goal: wife would like to hear pt talk OT Goal Formulation: With family Potential to Achieve Goals: Good ADL Goals Pt Will Perform Grooming: with mod assist;sitting Pt Will Perform Upper Body Bathing: with mod assist;bed level Additional ADL Goal #1: Pt will maintain sitting balance in midline  position x 10 min with min A +2 in preparation for ADL Additional ADL Goal #2: family will be independent with RUE PROM and positioning   Plan Discharge plan remains appropriate    Co-evaluation                 End of Session Equipment Utilized During Treatment: Other (comment) (trach)   Activity Tolerance Patient tolerated treatment well   Patient Left in bed;with call bell/phone within reach   Nurse Communication Other (comment) (need to be in chair position inbed BID if not OOB in chair)        Time: 1451-1510 OT Time Calculation (min): 19 min  Charges: OT General Charges $OT Visit: 1 Procedure OT Treatments $Therapeutic Activity: 8-22 mins  Kriston Pasquarello,HILLARY 01/14/2015, 3:26 PM   [facility], OTR/L  [id number] 01/14/2015 [place], OTR/L  [phone] 01/14/2015

## 2015-01-14 NOTE — Progress Notes (Signed)
Physical Therapy Treatment Patient Details Name: Darius Barber MRN: [id number] DOB: 1931-12-11 Today's Date: 01/14/2015    History of Present Illness pt presents with L Corona Radiata Infarct with concern for aspiration causing Hypoxic Respiratory Failure, which required intubation on 11/6.  pt with hx of CAD, CABG, CVA, and Colon CA.    PT Comments    Pt fatigues quickly and not consistently following directions.  Attempted to work on weight shifting in sitting and reaching with L UE, but pt quickly begins to require totalA.  Continue to feel pt will need Ltach level of care at D/C will continue to follow.    Follow Up Recommendations  LTACH     Equipment Recommendations  None recommended by PT    Recommendations for Other Services       Precautions / Restrictions Precautions Precautions: Fall Precaution Comments: trach/vent Restrictions Weight Bearing Restrictions: No    Mobility  Bed Mobility                  Transfers Overall transfer level: Needs assistance Equipment used: 2 person hand held assist             General transfer comment: Attempted weight shifting in sitting anteriorly over LEs to promote WBing, however pt not A with mobility and required total A.    Ambulation/Gait                 Stairs            Wheelchair Mobility    Modified Rankin (Stroke Patients Only)       Balance Overall balance assessment: Needs assistance Sitting-balance support: No upper extremity supported;Feet supported Sitting balance-Leahy Scale: Zero Sitting balance - Comments: Attempted weight shifting in sitting and having L UE reaching, however pt only attempted to follow direction x1 then required total A to weight shift and reach with L UE.                              Cognition Arousal/Alertness: Lethargic Behavior During Therapy: Flat affect Overall Cognitive Status: Impaired/Different from baseline Area of Impairment:  Following commands       Following Commands: Follows one step commands inconsistently;Follows one step commands with increased time       General Comments: pt seems to fatigue quickly and stops following directions.      Exercises      General Comments        Pertinent Vitals/Pain Pain Assessment: Faces Faces Pain Scale: No hurt    Home Living                      Prior Function            PT Goals (current goals can now be found in the care plan section) Acute Rehab PT Goals Patient Stated Goal: wife would like to hear pt talk PT Goal Formulation: Patient unable to participate in goal setting Time For Goal Achievement: 01/28/15 Potential to Achieve Goals: Fair Progress towards PT goals: Progressing toward goals    Frequency  Min 2X/week    PT Plan Current plan remains appropriate    Co-evaluation             End of Session Equipment Utilized During Treatment: Gait belt (Vent) Activity Tolerance: Patient limited by lethargy Patient left: in chair;with call bell/phone within reach     Time: [id number] PT Time Calculation (min) (  ACUTE ONLY): 27 min  Charges:  $Therapeutic Activity: 23-37 mins                    G CodesCatarina Hartshorn, [place] [phone] 01/14/2015, 2:58 PM

## 2015-01-14 NOTE — Progress Notes (Signed)
We have received yet another denial for LTAC from pt's insurance.  Will request another peer-to-peer immediately with Engineer, building services and Case Haematologist.    Will update staff as information is known.    Reinaldo Raddle, RN, BSN  Trauma/Neuro ICU Case Manager [phone]

## 2015-01-14 NOTE — Progress Notes (Signed)
SLP Cancellation Note  Patient Details Name: Darius Barber MRN: [id number] DOB: 10/06/1931   Cancelled treatment:       Reason Eval/Treat Not Completed: Patient with therapy/unavailable   [name] 01/14/2015, 4:32 PM

## 2015-01-14 NOTE — Progress Notes (Addendum)
PULMONARY  / CRITICAL CARE MEDICINE CONSULTATION   Name: Darius Barber MRN: [id number] DOB: 1931-11-08    ADMISSION DATE:  12/21/2014 CONSULTATION DATE: 12/21/2014  REQUESTING CLINICIAN: Dr. Leonie Man PRIMARY SERVICE: Neurology  CHIEF COMPLAINT:  Stroke  BRIEF PATIENT DESCRIPTION:  81 yo male from [place] with Rt sided weakness, and aphasia from CVA >> received tPA.  Developed progressive mental status change and intubated >> PCCM consulted.  SIGNIFICANT EVENTS and studies: 11/06 Admitted to hospital\ CT Head 11/7 - Left corona radiata infarct. No hemorrhage. Old infarcts.  TTE 11/7 - LV normal in size. Mild LVH. EF 55-60%. LA normal in size. RA normal in size. RV normal in size and function. No pericardial effusion. 11/19 MRI brain > evolving acute to subacute left corona radiata/internal capsule infarct without further propagation; chronically occluded R internal carotid with old R MCA territory infarct.   11/18 bedside tracheostomy Titus Mould) 11/20 tracheostomy site bleeding 11/21 Bleeding from trach >> hold ASA, plavix, SQ heparin 11/23 removed clot from tracheostomy 01/09/15: More awake and alert today; No weaning attempts in the last two days 01/10/15: Na improved. Nurse denies overnight issues. Wife at bedsie - indicates desire to move him to [facility]. PEG pending  SUBJECTIVE/OVERNIGHT/INTERVAL HX No events overnight, BP maintained.  VITAL SIGNS: Temp:  [97.6 F (36.4 C)-99.2 F (37.3 C)] 99.2 F (37.3 C) (11/30 0812) Pulse Rate:  [69-100] 77 (11/30 0700) Resp:  [15-25] 16 (11/30 0700) BP: (81-159)/(48-85) 121/67 mmHg (11/30 0700) SpO2:  [90 %-100 %] 94 % (11/30 0700) FiO2 (%):  [40 %-60 %] 40 % (11/30 0854) Weight:  [93.1 kg (205 lb 4 oz)] 93.1 kg (205 lb 4 oz) (11/30 0442)  VENTILATOR SETTINGS: Vent Mode:  [-] PSV;CPAP FiO2 (%):  [40 %-60 %] 40 % Set Rate:  [15 bmp] 15 bmp Vt Set:  [600 mL] 600 mL PEEP:  [5 cmH20-8 cmH20] 5 cmH20 Pressure Support:  [8 cmH20] 8  cmH20 Plateau Pressure:  [18 cmH20-19 cmH20] 19 cmH20   INTAKE / OUTPUT: Intake/Output      11/29 0701 - 11/30 0700 11/30 0701 - 12/01 0700   I.V. (mL/kg) 480 (5.2)    Other 510    NG/GT 3022.5    IV Piggyback 750    Total Intake(mL/kg) 4762.5 (51.2)    Urine (mL/kg/hr) 1980 (0.9)    Stool 0 (0)    Total Output 1980     Net +2782.5          Urine Occurrence 1 x    Stool Occurrence 1 x     PHYSICAL EXAMINATION:  Gen: chronically ill appearing, on vent HENT: Trach in place, no bleeding PULM: CTA bilaterally CV: RRR, no mgr GI: BS+, soft, nontender MSK: normal bulk and tone Neuro: awake, follows simple commands, Some squeeze and movement on left. Weak on right  LABS: PULMONARY No results for input(s): PHART, PCO2ART, PO2ART, HCO3, TCO2, O2SAT in the last 168 hours.  Invalid input(s): PCO2, PO2  CBC  Recent Labs Lab 01/12/15 0656 01/13/15 0213 01/14/15 0357  HGB 7.4* 7.2* 7.6*  HCT 24.8* 25.2* 26.3*  WBC 8.3 11.2* 10.6*  PLT 217 213 192   COAGULATION  Recent Labs Lab 01/12/15 0656  INR 1.27   CARDIAC    Recent Labs Lab 01/11/15 1405 01/11/15 2134 01/12/15 0656 01/13/15 0213  TROPONINI 0.22* 0.22* 0.25* 0.31*   No results for input(s): PROBNP in the last 168 hours.  CHEMISTRY  Recent Labs Lab 01/11/15 0235 01/11/15 1405 01/12/15 0656 01/13/15  [id number] 01/14/15 0357  NA 136 144 148* 148* 143  K 4.9 4.0 4.1 4.0 4.9  CL 108 111 112* 114* 110  CO2 23 28 29 28 26   GLUCOSE 125* 139* 119* 144* 154*  BUN 17 48* 44* 48* 50*  CREATININE 1.35* 1.51* 1.47* 1.59* 1.45*  CALCIUM 8.7* 8.3* 8.6* 8.6* 8.5*  MG  --  2.4 2.5* 2.5* 2.6*  PHOS  --  3.6  --  3.6 3.3   Estimated Creatinine Clearance: 45 mL/min (by C-G formula based on Cr of 1.45).  LIVER  Recent Labs Lab 01/12/15 0656  INR 1.27   INFECTIOUS No results for input(s): LATICACIDVEN, PROCALCITON in the last 168 hours.   ENDOCRINE CBG (last 3)   Recent Labs  01/14/15 0009  01/14/15 0410 01/14/15 0811  GLUCAP 136* 150* 147*   IMAGING x48h  - image(s) personally visualized  -   highlighted in bold Ir Gastrostomy Tube Mod Sed  01/12/2015  CLINICAL DATA:  Stroke EXAM: PERCUTANEOUS GASTROSTOMY FLUOROSCOPY TIME:  3 minutes and 42 seconds MEDICATIONS AND MEDICAL HISTORY: Versed 1 mg, Fentanyl 75 mcg. Glucagon 1 mg. ANESTHESIA/SEDATION: Moderate sedation time: 25 minutes CONTRAST:  None PROCEDURE: The procedure, risks, benefits, and alternatives were explained to the patient. Questions regarding the procedure were encouraged and answered. The patient understands and consents to the procedure. The epigastrium was prepped with Betadine in a sterile fashion, and a sterile drape was applied covering the operative field. A sterile gown and sterile gloves were used for the procedure. A 5-French orogastric tube is placed under fluoroscopic guidance. Scout imaging of the abdomen confirms barium within the transverse colon. The stomach was distended with gas. Under fluoroscopic guidance, an 18 gauge needle was utilized to puncture the anterior wall of the body of the stomach. An Amplatz wire was advanced through the needle passing a T fastener into the lumen of the stomach. The T fastener was secured for gastropexy. A 9-French sheath was inserted. A snare was advanced through the 9-French sheath. A Britta Mccreedy was advanced through the orogastric tube. It was snared then pulled out the oral cavity, pulling the snare, as well. The leading edge of the gastrostomy was attached to the snare. It was then pulled down the esophagus and out the percutaneous site. It was secured in place. Contrast was injected. COMPLICATIONS: None FINDINGS: The image demonstrates placement of a 20-French pull-through type gastrostomy tube into the body of the stomach. IMPRESSION: Successful 20 French pull-through gastrostomy. Electronically Signed   By: Marybelle Killings M.D.   On: 01/12/2015 13:00   Dg Chest Port 1  View  01/14/2015  CLINICAL DATA:  Aspiration pneumonia, previous CVA, history of coronary artery disease and CABG. EXAM: PORTABLE CHEST 1 VIEW COMPARISON:  Portable chest x-ray of January 13, 2015 FINDINGS: The bilateral alveolar opacities have become more confluent. The hemidiaphragms remain largely obscured especially on the left. The cardiac silhouette remains enlarged. The pulmonary vascularity is indistinct. The endotracheal tube tip lies 5 mm above the superior margin of the clavicles. IMPRESSION: Further interval deterioration in the appearance of the lungs with progressive alveolar filling processes consistent with pulmonary edema and/or pneumonia. Electronically Signed   By: David  Martinique M.D.   On: 01/14/2015 07:23   Dg Chest Port 1 View  01/13/2015  CLINICAL DATA:  Fever. EXAM: PORTABLE CHEST 1 VIEW COMPARISON:  January 10, 2015. FINDINGS: Stable cardiomediastinal silhouette. Status post coronary artery bypass graft. Tracheostomy tube is in grossly good position. No pneumothorax is noted.  Slightly increased bilateral interstitial lung opacities are noted, with right greater than left. This is concerning for edema. Increased right pleural effusion is noted as well. Bony thorax is unremarkable. IMPRESSION: Slightly increased bilateral interstitial lung densities are noted most consistent with edema. Mildly increased right pleural effusion is noted. Electronically Signed   By: Marijo Conception, M.D.   On: 01/13/2015 13:35   LINES / TUBES: ETT 11/6 >> 11/18 Trach 11/18 >>  Rt radial aline 11/22 >>11/25  I reviewed CXR myself, trach ok, mild pulmonary edema.  DISCUSSION: 81 y/o male with acute left internal capsule infarct with baseline old R MCA stroke admitted on 11/6 and developed confusion and respiratory failure from aspiration/ecoli pneumonia.  Tracheostomy placed 11/18, has been slow to wean since.  Not LTAC candidate until 21 days on ventilator have passed.  Has ARDS, slow to  improve. Discussed with bedside RN and RT.  ASSESSMENT / PLAN:  PULMONARY A: ARDS due to HCAP, PEEP remains at 8 and FiO2 at 50%.  High PS trials today. Diureses. Tracheostomy site bleeding >> resolved  P:   Repeat PS trials again as able. Hold ASA/Plavix/Sub cutaneous heparin due to tracheostomy bleeding, will restart 11/29 (PEG to be placed on 11/28). Active diureses today given increased expiratory effort and positive 2.5 liters in one day.  CARDIOVASCULAR A:  Intermittent VTach resolved Labile BP Hx of HTN, HLD, CAD s/p CABG, PAD Troponin rising.  P:  Tele monitroing PRN labetalol Lipitor daily Cardiology consult appreciated, start atenolol, ASA and plavix.  RENAL A:   Hypernatremia> worse. Chronic kidney disease, Baseline Cr ~1.7-2.0.  P:   Continue free water to 300 q4. Monitor BMET and UOP. Replace electrolytes as needed. Active diureses today given increased expiratory effort and positive 2.5 liters in one day.  GASTROINTESTINAL A:   Protein calorie malnutrition Dysphagia related to vent, stroke  P:   PEG placed 11/28. Protonix for SUP. Continue TF via PEG.  HEMATOLOGIC A:   Bleeding from tracheostomy >> improved 11/25 Anemia from blood loss, and critical illness  P:  Hold ASA, Plavix, sub cutaneous heparin restart 11/29 after PEG placement. 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  They voiced understanding and I answered many questions they had.  They will talk to him today about code status, going to [place].  01/10/15- wife wants him at [facility] after 21 days of hospitalization.  Poor prognosis.  Wife updated bedside, patient was denied in [place] due to insurance  issues, wife is quite distraught that he may have to go to [place] after a long discussion I advised her to speak with case management and case management medical director will speak to [facility] and decision is pending at this point, patient continues to fail weaning, BP is more fluctuant now, low dose atenolol restarted but will add holding parameters.  The patient is critically ill with multiple organ systems failure and requires high complexity decision making for assessment and support, frequent evaluation and titration of therapies, application of advanced monitoring technologies and extensive interpretation of multiple databases.   Critical Care Time devoted to patient care services described in this note is  35  Minutes. This time reflects time of care of this signee Dr Jennet Maduro. This critical care time does not reflect procedure time, or teaching time or supervisory time of PA/NP/Med student/Med Resident etc but could involve care discussion time.  Rush Farmer, M.D. [facility] Pulmonary/Critical Care Medicine. Pager: [phone]. After hours pager: [phone].  01/14/2015 9:02 AM

## 2015-01-14 NOTE — Progress Notes (Signed)
RT note-Placed back to PSV due to work of breathing, 12/5. Continue to monitor.

## 2015-01-15 ENCOUNTER — Inpatient Hospital Stay
Admission: AD | Admit: 2015-01-15 | Discharge: 2015-03-09 | Disposition: A | Discharge status: Home / Self Care | Payer: Medicare HMO | Source: Ambulatory Visit | Attending: Internal Medicine | Admitting: Internal Medicine

## 2015-01-15 ENCOUNTER — Institutional Professional Consult (permissible substitution) (HOSPITAL_COMMUNITY): Payer: Self-pay

## 2015-01-15 DIAGNOSIS — J9620 Acute and chronic respiratory failure, unspecified whether with hypoxia or hypercapnia: Secondary | ICD-10-CM

## 2015-01-15 DIAGNOSIS — Z9911 Dependence on respirator [ventilator] status: Secondary | ICD-10-CM

## 2015-01-15 DIAGNOSIS — Z431 Encounter for attention to gastrostomy: Secondary | ICD-10-CM

## 2015-01-15 DIAGNOSIS — Z930 Tracheostomy status: Secondary | ICD-10-CM

## 2015-01-15 DIAGNOSIS — J80 Acute respiratory distress syndrome: Secondary | ICD-10-CM

## 2015-01-15 DIAGNOSIS — J155 Pneumonia due to Escherichia coli: Secondary | ICD-10-CM

## 2015-01-15 DIAGNOSIS — J9690 Respiratory failure, unspecified, unspecified whether with hypoxia or hypercapnia: Secondary | ICD-10-CM

## 2015-01-15 LAB — BASIC METABOLIC PANEL
ANION GAP: 8 (ref 5–15)
BUN: 57 mg/dL — AB (ref 6–20)
CO2: 27 mmol/L (ref 22–32)
Calcium: 8.3 mg/dL — ABNORMAL LOW (ref 8.9–10.3)
Chloride: 107 mmol/L (ref 101–111)
Creatinine, Ser: 1.65 mg/dL — ABNORMAL HIGH (ref 0.61–1.24)
GFR calc non Af Amer: 37 mL/min — ABNORMAL LOW (ref >60)
GFR, EST AFRICAN AMERICAN: 43 mL/min — AB (ref >60)
Glucose, Bld: 149 mg/dL — ABNORMAL HIGH (ref 65–99)
POTASSIUM: 3.5 mmol/L (ref 3.5–5.1)
SODIUM: 142 mmol/L (ref 135–145)

## 2015-01-15 LAB — CBC
HCT: 22 % — ABNORMAL LOW (ref 39.0–52.0)
Hemoglobin: 6.4 g/dL — CL (ref 13.0–17.0)
MCH: 29 pg (ref 26.0–34.0)
MCHC: 29.5 g/dL — ABNORMAL LOW (ref 30.0–36.0)
MCV: 98.2 fL (ref 78.0–100.0)
PLATELETS: 169 10*3/uL (ref 150–400)
RBC: 2.24 MIL/uL — AB (ref 4.22–5.81)
RDW: 20.9 % — ABNORMAL HIGH (ref 11.5–15.5)
WBC: 8.7 10*3/uL (ref 4.0–10.5)

## 2015-01-15 LAB — GLUCOSE, CAPILLARY
GLUCOSE-CAPILLARY: 138 mg/dL — AB (ref 65–99)
GLUCOSE-CAPILLARY: 140 mg/dL — AB (ref 65–99)
Glucose-Capillary: 126 mg/dL — ABNORMAL HIGH (ref 65–99)
Glucose-Capillary: 159 mg/dL — ABNORMAL HIGH (ref 65–99)

## 2015-01-15 LAB — CULTURE, RESPIRATORY

## 2015-01-15 LAB — PHOSPHORUS: Phosphorus: 4.2 mg/dL (ref 2.5–4.6)

## 2015-01-15 LAB — CULTURE, RESPIRATORY W GRAM STAIN

## 2015-01-15 LAB — PREPARE RBC (CROSSMATCH)

## 2015-01-15 LAB — VANCOMYCIN, TROUGH: VANCOMYCIN TR: 32 ug/mL — AB (ref 10.0–20.0)

## 2015-01-15 LAB — MAGNESIUM: Magnesium: 2.3 mg/dL (ref 1.7–2.4)

## 2015-01-15 MED ORDER — SENNOSIDES-DOCUSATE SODIUM 8.6-50 MG PO TABS: 1.0000 | ORAL_TABLET | Freq: Every evening | ORAL | Status: AC | PRN

## 2015-01-15 MED ORDER — ALBUTEROL SULFATE (2.5 MG/3ML) 0.083% IN NEBU: 2.5000 mg | INHALATION_SOLUTION | RESPIRATORY_TRACT | Status: AC | PRN

## 2015-01-15 MED ORDER — POTASSIUM CHLORIDE 20 MEQ/15ML (10%) PO SOLN
40.0000 meq | Freq: Three times a day (TID) | ORAL | Status: AC
  Administered 2015-01-15 (×2): 40 meq
  Filled 2015-01-15 (×2): qty 30

## 2015-01-15 MED ORDER — LEVOTHYROXINE SODIUM 150 MCG PO TABS: 150.0000 ug | ORAL_TABLET | ORAL | Status: AC

## 2015-01-15 MED ORDER — CLOPIDOGREL BISULFATE 75 MG PO TABS: 75.0000 mg | ORAL_TABLET | Freq: Every day | ORAL | Status: AC

## 2015-01-15 MED ORDER — HEPARIN SODIUM (PORCINE) 5000 UNIT/ML IJ SOLN: 5000.0000 [IU] | Freq: Three times a day (TID) | INTRAMUSCULAR | Status: AC

## 2015-01-15 MED ORDER — ACETAMINOPHEN 160 MG/5ML PO SOLN: 650.0000 mg | Freq: Four times a day (QID) | ORAL | Status: AC | PRN

## 2015-01-15 MED ORDER — PRO-STAT SUGAR FREE PO LIQD: 30.0000 mL | Freq: Three times a day (TID) | ORAL | Status: AC

## 2015-01-15 MED ORDER — CHLORHEXIDINE GLUCONATE 0.12% ORAL RINSE (MEDLINE KIT): 15.0000 mL | Freq: Two times a day (BID) | OROMUCOSAL | Status: AC

## 2015-01-15 MED ORDER — LABETALOL HCL 5 MG/ML IV SOLN: 20.0000 mg | INTRAVENOUS | Status: AC | PRN

## 2015-01-15 MED ORDER — DEXTROSE 5 % IV SOLN: 2.0000 g | Freq: Two times a day (BID) | INTRAVENOUS | Status: AC

## 2015-01-15 MED ORDER — MIDAZOLAM HCL 2 MG/2ML IJ SOLN: 1.0000 mg | INTRAMUSCULAR | Status: AC | PRN

## 2015-01-15 MED ORDER — BETHANECHOL CHLORIDE 5 MG PO TABS: 5.0000 mg | ORAL_TABLET | Freq: Three times a day (TID) | ORAL | Status: AC

## 2015-01-15 MED ORDER — PANTOPRAZOLE SODIUM 40 MG PO PACK: 40.0000 mg | PACK | Freq: Every day | ORAL | Status: AC

## 2015-01-15 MED ORDER — FREE WATER: 300.0000 mL | Status: AC

## 2015-01-15 MED ORDER — FUROSEMIDE 10 MG/ML IJ SOLN
40.0000 mg | Freq: Two times a day (BID) | INTRAMUSCULAR | Status: DC
  Administered 2015-01-15: 40 mg via INTRAVENOUS
  Filled 2015-01-15: qty 4

## 2015-01-15 MED ORDER — VANCOMYCIN HCL IN DEXTROSE 750-5 MG/150ML-% IV SOLN: 750.0000 mg | Freq: Two times a day (BID) | INTRAVENOUS | Status: AC

## 2015-01-15 MED ORDER — JEVITY 1.2 CAL PO LIQD: ORAL | Status: AC

## 2015-01-15 MED ORDER — POTASSIUM CHLORIDE 20 MEQ/15ML (10%) PO SOLN: 40.0000 meq | Freq: Every day | ORAL | Status: AC

## 2015-01-15 MED ORDER — SODIUM CHLORIDE 0.9 % IV SOLN: INTRAVENOUS | Status: AC

## 2015-01-15 MED ORDER — POLYSACCHARIDE IRON COMPLEX 150 MG PO CAPS: 150.0000 mg | ORAL_CAPSULE | Freq: Every day | ORAL | Status: AC

## 2015-01-15 MED ORDER — INSULIN ASPART 100 UNIT/ML ~~LOC~~ SOLN: SUBCUTANEOUS | Status: AC

## 2015-01-15 MED ORDER — HEPARIN SODIUM (PORCINE) 5000 UNIT/ML IJ SOLN
5000.0000 [IU] | Freq: Three times a day (TID) | INTRAMUSCULAR | Status: DC
Start: 2015-01-15 — End: 2015-01-15
  Administered 2015-01-15: 5000 [IU] via SUBCUTANEOUS
  Filled 2015-01-15: qty 1

## 2015-01-15 MED ORDER — FENTANYL CITRATE (PF) 100 MCG/2ML IJ SOLN: 50.0000 ug | INTRAMUSCULAR | Status: AC | PRN

## 2015-01-15 MED ORDER — ATORVASTATIN CALCIUM 80 MG PO TABS: 80.0000 mg | ORAL_TABLET | Freq: Every day | ORAL | Status: AC

## 2015-01-15 MED ORDER — SODIUM CHLORIDE 0.9 % IV SOLN: Freq: Once | INTRAVENOUS | Status: DC

## 2015-01-15 MED ORDER — FUROSEMIDE 10 MG/ML IJ SOLN: 40.0000 mg | Freq: Every day | INTRAMUSCULAR | Status: AC

## 2015-01-15 MED ORDER — BISACODYL 10 MG RE SUPP: 10.0000 mg | Freq: Every day | RECTAL | Status: AC | PRN

## 2015-01-15 NOTE — Progress Notes (Signed)
PULMONARY  / CRITICAL CARE MEDICINE CONSULTATION   Name: Darius Barber MRN: [id number] DOB: 28-Jul-1931    ADMISSION DATE:  12/21/2014 CONSULTATION DATE: 12/21/2014  REQUESTING CLINICIAN: Dr. Leonie Man PRIMARY SERVICE: Neurology  CHIEF COMPLAINT:  Stroke  BRIEF PATIENT DESCRIPTION:  81 yo male from [place] with Rt sided weakness, and aphasia from CVA >> received tPA.  Developed progressive mental status change and intubated >> PCCM consulted.  SIGNIFICANT EVENTS and studies: 11/06 Admitted to hospital\ CT Head 11/7 - Left corona radiata infarct. No hemorrhage. Old infarcts.  TTE 11/7 - LV normal in size. Mild LVH. EF 55-60%. LA normal in size. RA normal in size. RV normal in size and function. No pericardial effusion. 11/19 MRI brain > evolving acute to subacute left corona radiata/internal capsule infarct without further propagation; chronically occluded R internal carotid with old R MCA territory infarct.   11/18 bedside tracheostomy Titus Mould) 11/20 tracheostomy site bleeding 11/21 Bleeding from trach >> hold ASA, plavix, SQ heparin 11/23 removed clot from tracheostomy 01/09/15: More awake and alert today; No weaning attempts in the last two days 01/10/15: Na improved. Nurse denies overnight issues. Wife at bedsie - indicates desire to move him to [facility]. PEG pending  SUBJECTIVE/OVERNIGHT/INTERVAL HX Hg of 6.4 overnight, being transfused, no further events.  VITAL SIGNS: Temp:  [97.8 F (36.6 C)-99.6 F (37.6 C)] 99.2 F (37.3 C) (12/01 0945) Pulse Rate:  [54-81] 69 (12/01 1000) Resp:  [14-27] 22 (12/01 1000) BP: (86-173)/(42-77) 126/73 mmHg (12/01 1000) SpO2:  [90 %-100 %] 97 % (12/01 1000) FiO2 (%):  [40 %-60 %] 40 % (12/01 0750) Weight:  [92.9 kg (204 lb 12.9 oz)] 92.9 kg (204 lb 12.9 oz) (12/01 0433)  VENTILATOR SETTINGS: Vent Mode:  [-] PSV;CPAP FiO2 (%):  [40 %-60 %] 40 % Set Rate:  [15 bmp] 15 bmp Vt Set:  [600 mL] 600 mL PEEP:  [5 cmH20-8 cmH20] 5 cmH20 Pressure  Support:  [12 cmH20] 12 cmH20 Plateau Pressure:  [11 cmH20-12 cmH20] 11 cmH20   INTAKE / OUTPUT: Intake/Output      11/30 0701 - 12/01 0700 12/01 0701 - 12/02 0700   I.V. (mL/kg) 480 (5.2) 60 (0.6)   Other 170    NG/GT 2520 55   IV Piggyback 200    Total Intake(mL/kg) 3370 (36.3) 115 (1.2)   Urine (mL/kg/hr) 2250 (1)    Stool 0 (0)    Total Output 2250     Net +1120 +115        Stool Occurrence 3 x     PHYSICAL EXAMINATION:  Gen: chronically ill appearing, on vent, weaning. Head: [place]/AT ENT: Trach in place, no bleeding.  PERRL, EOM-I and MMM. PULM: Coarse BS diffusely. CV: RRR, Nl S1/S2, -M/R/G. GI: BS+, soft, NT, ND and +BS. MSK: normal bulk and tone, -edema and -tenderness. Neuro: awake, follows simple commands, Some squeeze and movement on left. Weak on right  LABS: PULMONARY No results for input(s): PHART, PCO2ART, PO2ART, HCO3, TCO2, O2SAT in the last 168 hours.  Invalid input(s): PCO2, PO2  CBC  Recent Labs Lab 01/13/15 0213 01/14/15 0357 01/15/15 0428  HGB 7.2* 7.6* 6.4*  HCT 25.2* 26.3* 22.0*  WBC 11.2* 10.6* 8.7  PLT 213 192 169   COAGULATION  Recent Labs Lab 01/12/15 0656  INR 1.27   CARDIAC    Recent Labs Lab 01/11/15 1405 01/11/15 2134 01/12/15 0656 01/13/15 0213  TROPONINI 0.22* 0.22* 0.25* 0.31*   No results for input(s): PROBNP in the last 168  hours.  CHEMISTRY  Recent Labs Lab 01/11/15 1405 01/12/15 0656 01/13/15 0213 01/14/15 0357 01/15/15 0428  NA 144 148* 148* 143 142  K 4.0 4.1 4.0 4.9 3.5  CL 111 112* 114* 110 107  CO2 28 29 28 26 27   GLUCOSE 139* 119* 144* 154* 149*  BUN 48* 44* 48* 50* 57*  CREATININE 1.51* 1.47* 1.59* 1.45* 1.65*  CALCIUM 8.3* 8.6* 8.6* 8.5* 8.3*  MG 2.4 2.5* 2.5* 2.6* 2.3  PHOS 3.6  --  3.6 3.3 4.2   Estimated Creatinine Clearance: 39.5 mL/min (by C-G formula based on Cr of 1.65).  LIVER  Recent Labs Lab 01/12/15 0656  INR 1.27   INFECTIOUS No results for input(s): LATICACIDVEN,  PROCALCITON in the last 168 hours.   ENDOCRINE CBG (last 3)   Recent Labs  01/15/15 0010 01/15/15 0428 01/15/15 0803  GLUCAP 126* 138* 140*   IMAGING x48h  - image(s) personally visualized  -   highlighted in bold Dg Chest Port 1 View  01/14/2015  CLINICAL DATA:  Aspiration pneumonia, previous CVA, history of coronary artery disease and CABG. EXAM: PORTABLE CHEST 1 VIEW COMPARISON:  Portable chest x-ray of January 13, 2015 FINDINGS: The bilateral alveolar opacities have become more confluent. The hemidiaphragms remain largely obscured especially on the left. The cardiac silhouette remains enlarged. The pulmonary vascularity is indistinct. The endotracheal tube tip lies 5 mm above the superior margin of the clavicles. IMPRESSION: Further interval deterioration in the appearance of the lungs with progressive alveolar filling processes consistent with pulmonary edema and/or pneumonia. Electronically Signed   By: David  Martinique M.D.   On: 01/14/2015 07:23   Dg Chest Port 1 View  01/13/2015  CLINICAL DATA:  Fever. EXAM: PORTABLE CHEST 1 VIEW COMPARISON:  January 10, 2015. FINDINGS: Stable cardiomediastinal silhouette. Status post coronary artery bypass graft. Tracheostomy tube is in grossly good position. No pneumothorax is noted. Slightly increased bilateral interstitial lung opacities are noted, with right greater than left. This is concerning for edema. Increased right pleural effusion is noted as well. Bony thorax is unremarkable. IMPRESSION: Slightly increased bilateral interstitial lung densities are noted most consistent with edema. Mildly increased right pleural effusion is noted. Electronically Signed   By: Marijo Conception, M.D.   On: 01/13/2015 13:35   LINES / TUBES: ETT 11/6 >> 11/18 Trach 11/18 >>  Rt radial aline 11/22 >>11/25  I reviewed CXR myself, trach ok, mild pulmonary edema.  DISCUSSION: 81 y/o male with acute left internal capsule infarct with baseline old R MCA stroke  admitted on 11/6 and developed confusion and respiratory failure from aspiration/ecoli pneumonia.  Tracheostomy placed 11/18, has been slow to wean since.  Not LTAC candidate until 21 days on ventilator have passed.  Has ARDS, slow to improve. Discussed with bedside RN and RT.  ASSESSMENT / PLAN:  PULMONARY A: ARDS due to HCAP, PEEP remains at 8 and FiO2 at 50%.  Tracheostomy site bleeding >> resolved  P:   Repeat PS trials again as able. Hold ASA/Plavix/Sub cutaneous heparin due to tracheostomy bleeding, will restart 11/29 (PEG to be placed on 11/28). Active diureses today given increased expiratory effort and positive 2.5 liters in one day. Continue PS trials, currently on 12/5, will continue as long as able. Patient needs LTAC for long term weaning.  CARDIOVASCULAR A:  Intermittent VTach resolved Labile BP Hx of HTN, HLD, CAD s/p CABG, PAD Troponin rising.  P:  Tele monitroing. PRN labetalol. Lipitor daily. Cardiology consult appreciated, start atenolol,  ASA and plavix.  RENAL A:   Hypernatremia> worse. Chronic kidney disease, Baseline Cr ~1.7-2.0.  P:   Continue free water to 300 q4. Monitor BMET and UOP. Replace electrolytes as needed. Active diureses today given increased expiratory effort and positive 2.5 liters in one day.  GASTROINTESTINAL A:   Protein calorie malnutrition Dysphagia related to vent, stroke  P:   PEG placed 11/28. Protonix for SUP. Continue TF via PEG.  HEMATOLOGIC A:   Bleeding from tracheostomy >> improved 11/25 Anemia from blood loss, and critical illness  P:  Hold ASA, Plavix. Restart SQ heparin. F/u CBC.  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  Patient needs LTAC for long term weaning as I believe patient is weanable off the vent.  Rush Farmer, M.D. [facility] Pulmonary/Critical Care Medicine. Pager: [phone]. After hours pager: [phone].  01/15/2015 10:16 AM

## 2015-01-15 NOTE — Progress Notes (Signed)
Orthopedic Tech Progress Note Patient Details:  Darius Barber 09/04/1931 [id number]  Patient ID: Darius Barber, male   DOB: 27-Oct-1931, 81 y.o.   MRN: [id number] Called in bio-tech brace order; spoke with Darius Barber, Darius Barber 01/15/2015, 9:22 AM

## 2015-01-15 NOTE — Discharge Summary (Signed)
Physician Discharge Summary  Patient ID: Darius Barber MRN: [id number] DOB/AGE: 05/02/1931 81 y.o.  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  Tracheostomy site bleeding >> resolved  Discharge Plan:  Continue mechanical ventilation Daily SBT / WUA with pressure support trials  Lasix to 40 mg QD, goal even to negative balance as renal function / BP permit Intermittent CXR  PRN albuterol  Push PT efforts, mobilize as able  Monitor for further tracheostomy bleeding with restart of ASA, plavix   Intermittent V-Tach >> resolved Labile BP Hx of HTN, HLD, CAD s/p CABG, PAD Troponin rising.  Discharge Plan: Tele monitroing. PRN labetalol for SBP > 170 Lipitor daily. Continue atenolol, ASA and plavix. Hold losartan / nephrotoxic agents    Hypernatremia  Chronic kidney disease, Baseline Cr ~1.7-2.0.  Discharge Plan:  Continue free water to 300 q4. Monitor BMET and UOP. Replace electrolytes as needed Lasix as above   Protein calorie malnutrition Dysphagia related to vent, stroke  Discharge Plan:  PEG placed 11/28, site care per protocol  Protonix for SUP. Continue TF via PEG.   Bleeding from tracheostomy >> improved 11/25 Anemia from blood loss, and critical illness - s/p transfusion 12/1   Discharge Plan:  Resume ASA, Plavix. Restart SQ heparin. F/u  CBC  Hyperglycemia  Discharge Plan:  SSI  CBG Q4    Acute left internal capsule & corona radiata stroke > per neurology he may be able to ambulate with support with rehab Baseline old R MCA stroke Normopressure hydrocephalus (changes on MRI/CT) Encephalopathy - ICU related, follows commands but does not answer CAM-ICU questions appropriately  Discharge Plan:  RASS goal: 0 PRN fentanyl / versed  Right arm brace - cycle 2 hours on, 2 hours off                  DISCHARGE SUMMARY   Darius Barber is a 81 y.o. 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 on 11/6 with acute onset right sided weakness and aphasia. He was evaluated by tele-neurologists on call and ER MD and given tPA for NIHSS of 20.  He was transferred to [facility] for further evaluation.  CT of the head noted no acute process but signs of an old R MCA infarct.  The patient was taken to neuro-interventional radiology on arrival and was found to have a normal angiogram.  Shortly  after arrival to NICU, the patient developed altered mental status with a GCS of 7.  PCCM was consulted for emergent intubation.  Follow up CT of the head on 11/7 demonstrated a left corona radiata infarct, no hemorrhage and old R MCA infarct.  ECHO was assessed and showed LVEF of 55-60%.  The patient was maintained on mechanical ventilation. He developed fevers and suspected aspiration pneumonia.  The patient was treated empirically with antibiotics.  Sputum cultures returned positive for E-Coli.  Antibiotics were narrowed.  MRI of the brain on 11/19 demonstrated evolving acute to subacute left corona radiata/internal capsule infarct without further propagation; chronically occluded R internal carotid with old R MCA territory infarct.  ICU course was complicated by acute on chronic renal failure,  hypernatremia, thrombocytopenia and hyperglycemia. He was unable to wean from mechanical ventilation and tracheostomy was placed on 11/18 per Dr. Titus Mould.  The patient subsequently had bleeding from his tracheostomy on 11/20 while on aspirin, plavix and SQ heparin.  Medications were held with resolution of bleeding.  The patient further had placement of PEG tube on 11/28 for ongoing nutritional support.  The patient was noted to have anemia (supected from chronic disease + acute blood loss anemia) and was transfused 1 unit PRBC's on 12/1.  He was evaluated by PT, OT, SLP.  Right arm was fitted with protective brace, cycling 2 hours on / 2 hours off.  He was medically cleared for discharge to [facility] on 12/1 for further rehab / weaning efforts.                SIGNIFICANT EVENTS / STUDIES: 11/06  Admitted to hospital 11/07  CT Head >> Left corona radiata infarct. No hemorrhage. Old infarcts.  11/07  TTE >> LV normal in size. Mild LVH. EF 55-60%. LA normal in size. RA normal in size. RV normal in size and function. No             pericardial effusion. 11/18  bedside tracheostomy Titus Mould) 11/19  MRI brain >> evolving acute to subacute left corona radiata/internal capsule infarct without further propagation; chronically              occluded R internal carotid with old R MCA territory infarct.  11/20  tracheostomy site bleeding 11/21  Bleeding from trach >> hold ASA, plavix, SQ heparin 11/23  removed clot from tracheostomy 11/25  More awake and alert today; No weaning attempts in the last two days 11/26  Na improved. Nurse denies overnight issues. Wife at bedsie - indicates desire to move him to [facility]. PEG pending  11/28  PEG per IR  12/01  Tx 1 unit PRBC   MICRO DATA  Tracheal Aspirate 11/17 >> E-Coli >> Resistant to ampicillin, cefazolin.  Intermediate to zosyn.  Sensitive to rocephin, bactrim Tracheal Aspirate 11/18 >> E-Coli >> Resistant to amp, cefazolin, zosyn.   Otherwise sensitive.  BCx2 11/29 >>  Tracheal Aspirate 11/29 >>   ANTIBIOTICS Unasyn 11/6 >> 11/16 Ceftazidime 11/29 >>  Vancomycin 11/30 >>   CONSULTS Stroke / Neurology  Speech Pathology  Interventional Radiology   TUBES / LINES Trach 11/18 >> PEG 11/28 >>     Discharge Exam: Gen: chronically ill appearing, on vent, weaning. Head: [place]/AT ENT: Trach in place, no bleeding. PERRL, EOM-I and MMM. PULM: Coarse BS diffusely. CV: RRR, Nl S1/S2, -M/R/G. GI: BS+, soft, NT, ND and +BS. MSK: normal bulk and tone, -edema and -tenderness. Neuro: awake, follows simple commands, Some squeeze and movement  on left. Weak on right, R arm brace in place  Filed Vitals:   01/15/15 1100 01/15/15 1145 01/15/15 1200 01/15/15 1300  BP: 146/76  98/45 105/52  Pulse: 66  57 59  Temp: 98.6 F (37 C) 98.6 F (37 C)    TempSrc: Axillary Axillary    Resp: _0 Height:      Weight:      SpO2: 92%  97% 99%    Discharge Labs  BMET  Recent Labs Lab 01/11/15 1405 01/12/15 0656 01/13/15 0213 01/14/15 0357 01/15/15 0428  NA 144 148* 148* 143 142  K 4.0 4.1 4.0 4.9 3.5  CL 111 112* 114* 110 107  CO2 _1 GLUCOSE 139* 119* 144* 154* 149*  BUN 48* 44* 48* 50* 57*  CREATININE 1.51* 1.47* 1.59* 1.45* 1.65*  CALCIUM 8.3* 8.6* 8.6* 8.5* 8.3*  MG 2.4 2.5* 2.5* 2.6* 2.3  PHOS 3.6  --  3.6 3.3 4.2   CBC  Recent Labs Lab 01/13/15 0213 01/14/15 0357 01/15/15 0428  HGB 7.2* 7.6* 6.4*  HCT 25.2* 26.3* 22.0*  WBC 11.2* 10.6* 8.7  PLT 213 192 169   Anti-Coagulation  Recent Labs Lab 01/12/15 0656  INR 1.27    Discharge Instructions    Ambulatory referral to Neurology    Complete by:  As directed   Pt will follow up with Dr. Erlinda Hong at [facility] in about 3-4 months. Thanks.     Call MD for:  difficulty breathing, headache or visual disturbances    Complete by:  As directed      Call MD for:  extreme fatigue    Complete by:  As directed      Call MD for:  hives    Complete  by:  As directed      Call MD for:  persistant nausea and vomiting    Complete by:  As directed      Call MD for:  redness, tenderness, or signs of infection (pain, swelling, redness, odor or green/yellow discharge around incision site)    Complete by:  As directed      Call MD for:  severe uncontrolled pain    Complete by:  As directed      Call MD for:  temperature >100.4    Complete by:  As directed      Discharge instructions    Complete by:  As directed   Keep all lines / tubes in place for transfer.   Trach care per protocol  PEG care per protocol     Discharge wound care:    Complete by:  As directed   Trach / PEG care per protocol     Increase activity slowly    Complete by:  As directed                 Follow-up Information    Follow up with Xu,Jindong, MD. Schedule an appointment as soon as possible for a visit in 2 months.   Specialty:  Neurology   Why:  stroke clinic   Contact information:   [address] [place] [address] [phone]       Follow up with [facility].   Contact information:   [place] [phone]         Medication List    STOP taking these medications        ALPRAZolam 0.25 MG  tablet  Commonly known as:  XANAX     furosemide 40 MG tablet  Commonly known as:  LASIX  Replaced by:  furosemide 10 MG/ML injection     losartan 50 MG tablet  Commonly known as:  COZAAR     NIFEdipine 60 MG 24 hr tablet  Commonly known as:  PROCARDIA XL/ADALAT-CC     sertraline 100 MG tablet  Commonly known as:  ZOLOFT     testosterone cypionate 200 MG/ML injection  Commonly known as:  DEPOTESTOSTERONE CYPIONATE     traZODone 150 MG tablet  Commonly known as:  DESYREL      TAKE these medications        acetaminophen 160 MG/5ML solution  Commonly known as:  TYLENOL  Take 20.3 mLs (650 mg total) by mouth every 6 (six) hours as needed for mild pain, headache  or fever.     albuterol (2.5 MG/3ML) 0.083% nebulizer solution  Commonly known as:  PROVENTIL  Take 3 mLs (2.5 mg total) by nebulization every 4 (four) hours as needed for wheezing or shortness of breath.     aspirin EC 81 MG EC tablet  Generic drug:  aspirin  Take 81 mg by mouth daily. Swallow whole.     atenolol 25 MG tablet  Commonly known as:  TENORMIN  Take 1 tablet (25 mg total) by mouth daily.     atorvastatin 80 MG tablet  Commonly known as:  LIPITOR  Place 1 tablet (80 mg total) into feeding tube daily.     bethanechol 5 MG tablet  Commonly known as:  URECHOLINE  Place 1 tablet (5 mg total) into feeding tube 3 (three) times daily.     bisacodyl 10 MG suppository  Commonly known as:  DULCOLAX  Place 1 suppository (10 mg total) rectally daily as needed for moderate constipation.     cefTAZidime 2 g in dextrose 5 % 50 mL  Inject 2 g into the vein every 12 (twelve) hours.     chlorhexidine gluconate 0.12 % solution  Commonly known as:  PERIDEX  15 mLs by Mouth Rinse route 2 (two) times daily.     clopidogrel 75 MG tablet  Commonly known as:  PLAVIX  Place 1 tablet (75 mg total) into feeding tube daily.     feeding supplement (JEVITY 1.2 CAL) Liqd  Infuse at 55 ml/hr via feeding tube     feeding supplement (PRO-STAT SUGAR FREE 64) Liqd  Place 30 mLs into feeding tube 3 (three) times daily.     fentaNYL 100 MCG/2ML injection  Commonly known as:  SUBLIMAZE  Inject 1 mL (50 mcg total) into the vein every 2 (two) hours as needed (to maintain RASS goal.).     free water Soln  Place 300 mLs into feeding tube every 4 (four) hours.     furosemide 10 MG/ML injection  Commonly known as:  LASIX  Inject 4 mLs (40 mg total) into the vein daily.     heparin 5000 UNIT/ML injection  Inject 1 mL (5,000 Units total) into the skin every 8 (eight) hours.     insulin aspart 100 UNIT/ML injection  Commonly known as:  novoLOG  CBG < 70: implement hypoglycemia protocol CBG 70 -  120: 0 units CBG 121 - 150: 1 unit CBG 151 - 200: 2 units CBG 201 - 250: 3 units CBG 251 - 300: 5 units CBG 301 - 350: 7 units CBG 351 - 400: 9 units CBG > 400: call  MD and obtain STAT lab verification     iron polysaccharides 150 MG capsule  Commonly known as:  NIFEREX  Place 1 capsule (150 mg total) into feeding tube daily.     labetalol 5 MG/ML injection  Commonly known as:  NORMODYNE,TRANDATE  Inject 4 mLs (20 mg total) into the vein every 10 (ten) minutes as needed (Until SBP < 170 mmHg).     levothyroxine 150 MCG tablet  Commonly known as:  SYNTHROID, LEVOTHROID  Place 1 tablet (150 mcg total) into feeding tube every morning.     midazolam 2 MG/2ML Soln injection  Commonly known as:  VERSED  Inject 1 mL (1 mg total) into the vein every 2 (two) hours as needed for agitation (to maintain RASS goal.).     pantoprazole sodium 40 mg/20 mL Pack  Commonly known as:  PROTONIX  Place 20 mLs (40 mg total) into feeding tube daily.     potassium chloride 20 MEQ/15ML (10%) Soln  Place 30 mLs (40 mEq total) into feeding tube daily.     senna-docusate 8.6-50 MG tablet  Commonly known as:  Senokot-S  Take 1 tablet by mouth at bedtime as needed for moderate constipation.     sodium chloride 0.9 % infusion  20 ml/hr     Vancomycin 750 MG/150ML Soln  Commonly known as:  VANCOCIN  Inject 150 mLs (750 mg total) into the vein every 12 (twelve) hours.          Disposition:  [facility]   Discharged Condition: Darius Barber has met maximum benefit of inpatient care and is medically stable and cleared for discharge.  Patient is pending follow up as above.      Time spent on disposition:  Greater than 35 minutes.   Signed: Noe Gens, NP-C [facility] Pgr: [phone] Office: [phone]  Patient seen and examined, agree with above note.  I dictated the care and orders written for this patient under my direction.  Rush Farmer, MD [phone]

## 2015-01-15 NOTE — Progress Notes (Signed)
CRITICAL VALUE ALERT  Critical value received:  Hgb 6.4  Date of notification:  01/15/15  Time of notification:  0630  Critical value read back:Yes.    Nurse who received alert:  Beacher May  MD notified (1st page):  Deterding, MD  Time of first page:  [phone]  MD notified (2nd page):  Time of second page:  Responding MD:  Deterding, MD  Time MD responded:  [phone]

## 2015-01-15 NOTE — Progress Notes (Signed)
[facility] ADULT ICU REPLACEMENT PROTOCOL FOR AM LAB REPLACEMENT ONLY  The patient does apply for the [facility] Adult ICU Electrolyte Replacment Protocol based on the criteria listed below:   Is GFR >/= 40 ml/min? No.  Patient's GFR today is 37  Abnormal electrolyte(s):K3.5   If a panic level lab has been reported, has the CCM MD in charge been notified? Yes.  .   Physician:  E Deterding,MD  Vear Clock 01/15/2015 6:39 AM

## 2015-01-15 NOTE — Progress Notes (Signed)
eLink Physician-Brief Progress Note Patient Name: Darius Barber DOB: 1931/08/26 MRN: [id number]   Date of Service  01/15/2015  HPI/Events of Note  Drop in Hgb from 7.6 to 6.4.  Patient not on heparin but restarted plavix and ASA yesterday.  BP soft but no major change.  Some increase in ABD distension.  eICU Interventions  Transfuse 1 unit pRBC Consider d/c of ASA/Plavix if Hgb continues to drop Evaluate for bleeding at bedside     Intervention Category Intermediate Interventions: Other:  DETERDING,ELIZABETH 01/15/2015, 6:45 AM

## 2015-01-15 NOTE — Progress Notes (Signed)
Pt transferred to [place] hospital room 5143 with RT Kim. Hand off with Broadus John RN done. Family with patient.

## 2015-01-15 NOTE — Progress Notes (Signed)
Notified by Case Management Medical Director that pt has been approved by insurance for admission to [facility] [place].  Notified Bryan, admissions liaison with [facility] who will coordinate securing bed on LTAC unit today.  Will notify attending to prepare for dc to [facility].  Family notified, and they are overjoyed.    Please notify Case Manager if I can be of assistance with transfer.    Reinaldo Raddle, RN, BSN  Trauma/Neuro ICU Case Manager [phone]

## 2015-01-16 ENCOUNTER — Other Ambulatory Visit (HOSPITAL_COMMUNITY): Payer: Self-pay

## 2015-01-16 DIAGNOSIS — I10 Essential (primary) hypertension: Secondary | ICD-10-CM

## 2015-01-16 DIAGNOSIS — T670XXA Heatstroke and sunstroke, initial encounter: Secondary | ICD-10-CM

## 2015-01-16 LAB — TYPE AND SCREEN
ABO/RH(D): O POS
Antibody Screen: NEGATIVE
Unit division: 0

## 2015-01-16 LAB — HEMOGLOBIN AND HEMATOCRIT, BLOOD
HEMATOCRIT: 27.3 % — AB (ref 39.0–52.0)
HEMOGLOBIN: 7.9 g/dL — AB (ref 13.0–17.0)

## 2015-01-16 NOTE — Consult Note (Addendum)
Name: Darius Barber MRN: [id number] DOB: 1932/01/13    ADMISSION DATE:  01/15/2015 CONSULTATION DATE:  01/16/2015  REFERRING MD :  SSH-MD  CHIEF COMPLAINT:  Respiratory failure and vent management.  BRIEF PATIENT DESCRIPTION: 81 year old male s/p tPA for left sided ischemic CVA who was unable to wean and protect his airway and was intubated then subsequently trached complicated with HCAP then subsequently transferred to [facility] for further weaning.  SIGNIFICANT EVENTS  01/15/2015 admission to [facility]  STUDIES:  None  HISTORY OF PRESENT ILLNESS:  81 year old male s/p tPA for left sided ischemic CVA who was unable to wean and protect his airway and was intubated then subsequently trached complicated with HCAP then subsequently transferred to [facility] for further weaning.  PAST MEDICAL HISTORY :   has a past medical history of Hyperlipidemia; Colon cancer ([place]); Essential hypertension; Diverticulosis; Coronary artery disease; Carotid artery occlusion; Hypothyroidism; PAD (peripheral artery disease) ([place]); Renal insufficiency, mild; and History of stroke.  has past surgical history that includes Coronary artery bypass graft (Oct 2006); Hemorrhoid surgery; Hemicolectomy (2007); Carotid endarterectomy; arch aortogram (N/A, 08/06/2013); carotid angiogram (N/A, 08/06/2013); carotid stent insertion (Left, 08/20/2013); and Radiology with anesthesia (N/A, 12/21/2014). Prior to Admission medications   Medication Sig Start Date End Date Taking? Authorizing Provider  acetaminophen (TYLENOL) 160 MG/5ML solution Take 20.3 mLs (650 mg total) by mouth every 6 (six) hours as needed for mild pain, headache or fever. 01/15/15   Donita Brooks, NP  albuterol (PROVENTIL) (2.5 MG/3ML) 0.083% nebulizer solution Take 3 mLs (2.5 mg total) by nebulization every 4 (four) hours as needed for wheezing or shortness of breath. 01/15/15   Donita Brooks, NP  Amino Acids-Protein Hydrolys (FEEDING SUPPLEMENT, PRO-STAT SUGAR FREE 64,) LIQD Place  30 mLs into feeding tube 3 (three) times daily. 01/15/15   Donita Brooks, NP  aspirin (ASPIRIN EC) 81 MG EC tablet Take 81 mg by mouth daily. Swallow whole.    Historical Provider, MD  atenolol (TENORMIN) 25 MG tablet Take 1 tablet (25 mg total) by mouth daily. 11/12/14   Carlena Bjornstad, MD  atorvastatin (LIPITOR) 80 MG tablet Place 1 tablet (80 mg total) into feeding tube daily. 01/15/15   Donita Brooks, NP  bethanechol (URECHOLINE) 5 MG tablet Place 1 tablet (5 mg total) into feeding tube 3 (three) times daily. 01/15/15   Donita Brooks, NP  bisacodyl (DULCOLAX) 10 MG suppository Place 1 suppository (10 mg total) rectally daily as needed for moderate constipation. 01/15/15   Donita Brooks, NP  cefTAZidime 2 g in dextrose 5 % 50 mL Inject 2 g into the vein every 12 (twelve) hours. 01/15/15 01/20/15  Donita Brooks, NP  chlorhexidine gluconate (PERIDEX) 0.12 % solution 15 mLs by Mouth Rinse route 2 (two) times daily. 01/15/15   Donita Brooks, NP  clopidogrel (PLAVIX) 75 MG tablet Place 1 tablet (75 mg total) into feeding tube daily. 01/15/15   Donita Brooks, NP  fentaNYL (SUBLIMAZE) 100 MCG/2ML injection Inject 1 mL (50 mcg total) into the vein every 2 (two) hours as needed (to maintain RASS goal.). 01/15/15   Donita Brooks, NP  furosemide (LASIX) 10 MG/ML injection Inject 4 mLs (40 mg total) into the vein daily. 01/15/15   Donita Brooks, NP  heparin 5000 UNIT/ML injection Inject 1 mL (5,000 Units total) into the skin every 8 (eight) hours. 01/15/15   Donita Brooks, NP  insulin aspart (NOVOLOG) 100 UNIT/ML injection  CBG < 70: implement hypoglycemia protocol CBG 70 - 120: 0 units CBG 121 - 150: 1 unit CBG 151 - 200: 2 units CBG 201 - 250: 3 units CBG 251 - 300: 5 units CBG 301 - 350: 7 units CBG 351 - 400: 9 units CBG > 400: call MD and obtain STAT lab verification 01/15/15   Donita Brooks, NP  iron polysaccharides (NIFEREX) 150 MG capsule Place 1 capsule (150 mg total) into feeding tube daily.  01/15/15   Donita Brooks, NP  labetalol (NORMODYNE,TRANDATE) 5 MG/ML injection Inject 4 mLs (20 mg total) into the vein every 10 (ten) minutes as needed (Until SBP < 170 mmHg). 01/15/15   Donita Brooks, NP  levothyroxine (SYNTHROID, LEVOTHROID) 150 MCG tablet Place 1 tablet (150 mcg total) into feeding tube every morning. 01/15/15   Donita Brooks, NP  midazolam (VERSED) 2 MG/2ML SOLN injection Inject 1 mL (1 mg total) into the vein every 2 (two) hours as needed for agitation (to maintain RASS goal.). 01/15/15   Donita Brooks, NP  Nutritional Supplements (FEEDING SUPPLEMENT, JEVITY 1.2 CAL,) LIQD Infuse at 55 ml/hr via feeding tube 01/15/15   Donita Brooks, NP  pantoprazole sodium (PROTONIX) 40 mg/20 mL PACK Place 20 mLs (40 mg total) into feeding tube daily. 01/15/15   Donita Brooks, NP  potassium chloride 20 MEQ/15ML (10%) SOLN Place 30 mLs (40 mEq total) into feeding tube daily. 01/15/15   Donita Brooks, NP  senna-docusate (SENOKOT-S) 8.6-50 MG tablet Take 1 tablet by mouth at bedtime as needed for moderate constipation. 01/15/15   Donita Brooks, NP  sodium chloride 0.9 % infusion 20 ml/hr 01/15/15   Donita Brooks, NP  Vancomycin (VANCOCIN) 750 MG/150ML SOLN Inject 150 mLs (750 mg total) into the vein every 12 (twelve) hours. 01/15/15 01/20/15  Donita Brooks, NP  Water For Irrigation, Sterile (FREE WATER) SOLN Place 300 mLs into feeding tube every 4 (four) hours. 01/15/15   Donita Brooks, NP   Allergies  Allergen Reactions  . Simvastatin     REACTION: muscle aches    FAMILY HISTORY:  family history is not on file. SOCIAL HISTORY:  reports that he quit smoking about 35 years ago. His smoking use included Cigarettes. He has never used smokeless tobacco. He reports that he does not drink alcohol or use illicit drugs.  REVIEW OF SYSTEMS:   Unable to obtain, patient is aphasic.  SUBJECTIVE: No events overnight.  VITAL SIGNS: Pulse Rate:  [58-65] 58 (12/01 1500) Resp:  [15-22] 15 (12/01  1500) BP: (105-122)/(52-64) 105/52 mmHg (12/01 1500) SpO2:  [96 %-97 %] 97 % (12/01 1500)  PHYSICAL EXAMINATION: General:  Chronically ill appearing male in NAD. Neuro:  Arousable but not following commands. HEENT:  [place]/AT, PERRL, EOM-I and MMM.  Trach in good position. Cardiovascular:  RRR, Nl S1/S2, -M/R/G. Lungs:  Coarse BS diffusely. Abdomen:  Soft, NT, ND and +BS. Musculoskeletal:  -edema and -tenderness. Skin:  Scattered bruises but otherwise intact.   Recent Labs Lab 01/13/15 0213 01/14/15 0357 01/15/15 0428  NA 148* 143 142  K 4.0 4.9 3.5  CL 114* 110 107  CO2 28 26 27   BUN 48* 50* 57*  CREATININE 1.59* 1.45* 1.65*  GLUCOSE 144* 154* 149*    Recent Labs Lab 01/13/15 0213 01/14/15 0357 01/15/15 0428 01/16/15 0811  HGB 7.2* 7.6* 6.4* 7.9*  HCT 25.2* 26.3* 22.0* 27.3*  WBC 11.2* 10.6* 8.7  --   PLT  213 192 169  --    Dg Chest Port 1 View  01/16/2015  CLINICAL DATA:  Aspiration pneumonia.  Respiratory failure. EXAM: PORTABLE CHEST 1 VIEW COMPARISON:  01/14/2015. FINDINGS: Tracheostomy tube in stable position. Prior CABG. Stable cardiomegaly. Diffuse unchanged dense airspace disease noted. No pleural effusion or pneumothorax. Surgical clips upper chest. IMPRESSION: 1. Tracheostomy tube in stable position. 2. Prior CABG.  Stable cardiomegaly. 3. Persistent unchanged dense bilateral airspace disease. Electronically Signed   By: Marcello Moores  Register   On: 01/16/2015 07:32   Dg Abd Portable 1v  01/15/2015  CLINICAL DATA:  81 year old male status put peg tube placement. History of renal insufficiency, coronary artery disease and diverticulosis. EXAM: PORTABLE ABDOMEN - 1 VIEW COMPARISON:  01/12/2015. FINDINGS: The retention balloon of percutaneous gastrostomy tube projects over the distal body of the stomach. Gas and stool are noted throughout the colon. No pneumoperitoneum identified on this single supine view. No pathologic dilatation of small bowel. Limited visualization of  the lower thorax demonstrates median sternotomy wires and surgical clips, presumably from prior CABG. IMPRESSION: 1. Tip of percutaneous gastrostomy tube projects over the distal body of the stomach. Electronically Signed   By: Vinnie Langton M.D.   On: 01/15/2015 19:21   I reviewed CXR myself, trach in good position, pulmonary edema.  Discussion:  81 y/o male with acute left internal capsule infarct with baseline old R MCA stroke admitted on 11/6 and developed confusion and respiratory failure from aspiration/ecoli pneumonia. Tracheostomy placed 11/18, has been slow to wean since. Not LTAC candidate until 21 days on ventilator have passed.  Has ARDS, slow to improve. Discussed with bedside RN and RT.  ASSESSMENT / PLAN:  PULMONARY A: ARDS due to HCAP, PEEP at 5 and FiO2 of 40%. Fluid overload. Tracheostomy site bleeding >> resolved  P:  Continue weaning as able per [facility] protocol, high PS trials. Diureses as renal function allows. Abx for PNA as below.  CARDIOVASCULAR A:  Intermittent VTach resolved Labile BP Hx of HTN, HLD, CAD s/p CABG, PAD Troponin rising.  P:  Tele monitroing. PRN labetalol. Lipitor daily. Restart ASA.  RENAL A:  Hypernatremia> worse. Chronic kidney disease, Baseline Cr ~1.7-2.0.  P:  Decrease free water since Na has improved. Diureses as ordered. Monitor BMET and UOP. Replace electrolytes as needed.  GASTROINTESTINAL A:  Protein calorie malnutrition Dysphagia related to vent, stroke  P:  PEG placed 11/28. Protonix for SUP. Continue TF via PEG.  HEMATOLOGIC A:  Bleeding from tracheostomy >> improved 11/25 Anemia from blood loss, and critical illness  P:  Hold ASA, Plavix, sub cutaneous heparin restart 11/29 after PEG placement. F/u CBC   INFECTIOUS A:  CULTURES: Tracheal Aspirate (11/6): Oral Flora 11/18 resp >> Ecoli amp resistant HCAP> E Coli Febrile and hypotensive overnight  P:   ANTIBIOTICS: Unasyn 11/6>>11/16 Ceftriaxone 11/20 >> LAST DAY 01/11/15 Ceftaz 11/28>>>12/2 Vanc 11/29>>>12/2 Imipenem 12/2>>>  ENDOCRINE A:  Hyperglycemia P:  Sub cutaneous insulin, sliding scale  NEUROLOGIC A:  Acute left internal capsule and corona radiata stroke > per neurology he may be able to ambulate with support with rehab Baseline old R MCA stroke Normopressure hydrocephalus (changes on MRI/CT) Encephalopathy> ICU related, follows commands but does not answer CAM-ICU questions appropriately  P:  RASS goal: 0 PRN fentanyl  Discussed with SSH-MD.  Rush Farmer, M.D. [facility] Pulmonary/Critical Care Medicine. Pager: [phone]. After hours pager: [phone].  01/16/2015, 1:55 PM

## 2015-01-17 LAB — URINALYSIS, ROUTINE W REFLEX MICROSCOPIC
Bilirubin Urine: NEGATIVE
GLUCOSE, UA: NEGATIVE mg/dL
Ketones, ur: NEGATIVE mg/dL
Nitrite: NEGATIVE
PROTEIN: 30 mg/dL — AB
Specific Gravity, Urine: 1.021 (ref 1.005–1.030)
pH: 5.5 (ref 5.0–8.0)

## 2015-01-17 LAB — COMPREHENSIVE METABOLIC PANEL
ALBUMIN: 1.8 g/dL — AB (ref 3.5–5.0)
ALT: 54 U/L (ref 17–63)
ANION GAP: 11 (ref 5–15)
AST: 109 U/L — ABNORMAL HIGH (ref 15–41)
Alkaline Phosphatase: 54 U/L (ref 38–126)
BILIRUBIN TOTAL: 0.7 mg/dL (ref 0.3–1.2)
BUN: 56 mg/dL — ABNORMAL HIGH (ref 6–20)
CO2: 24 mmol/L (ref 22–32)
Calcium: 8.4 mg/dL — ABNORMAL LOW (ref 8.9–10.3)
Chloride: 107 mmol/L (ref 101–111)
Creatinine, Ser: 1.48 mg/dL — ABNORMAL HIGH (ref 0.61–1.24)
GFR calc Af Amer: 49 mL/min — ABNORMAL LOW (ref >60)
GFR, EST NON AFRICAN AMERICAN: 43 mL/min — AB (ref >60)
GLUCOSE: 118 mg/dL — AB (ref 65–99)
POTASSIUM: 4.8 mmol/L (ref 3.5–5.1)
Sodium: 142 mmol/L (ref 135–145)
TOTAL PROTEIN: 5.4 g/dL — AB (ref 6.5–8.1)

## 2015-01-17 LAB — URINE MICROSCOPIC-ADD ON

## 2015-01-17 LAB — CBC
HEMATOCRIT: 25 % — AB (ref 39.0–52.0)
Hemoglobin: 7.3 g/dL — ABNORMAL LOW (ref 13.0–17.0)
MCH: 28.7 pg (ref 26.0–34.0)
MCHC: 29.2 g/dL — AB (ref 30.0–36.0)
MCV: 98.4 fL (ref 78.0–100.0)
Platelets: 171 10*3/uL (ref 150–400)
RBC: 2.54 MIL/uL — ABNORMAL LOW (ref 4.22–5.81)
RDW: 21 % — AB (ref 11.5–15.5)
WBC: 8.7 10*3/uL (ref 4.0–10.5)

## 2015-01-18 LAB — CULTURE, BLOOD (ROUTINE X 2)
CULTURE: NO GROWTH
CULTURE: NO GROWTH

## 2015-01-19 DIAGNOSIS — Z9911 Dependence on respirator [ventilator] status: Secondary | ICD-10-CM

## 2015-01-19 DIAGNOSIS — J155 Pneumonia due to Escherichia coli: Secondary | ICD-10-CM

## 2015-01-19 DIAGNOSIS — Z930 Tracheostomy status: Secondary | ICD-10-CM

## 2015-01-19 DIAGNOSIS — J80 Acute respiratory distress syndrome: Secondary | ICD-10-CM

## 2015-01-19 DIAGNOSIS — J9620 Acute and chronic respiratory failure, unspecified whether with hypoxia or hypercapnia: Secondary | ICD-10-CM

## 2015-01-19 NOTE — Consult Note (Signed)
   Name: HERSEL DAGLE MRN: [id number] DOB: Apr 03, 1931    ADMISSION DATE:  01/15/2015 CONSULTATION DATE:  01/16/2015  REFERRING MD :  SSH-MD  CHIEF COMPLAINT:  Respiratory failure and vent management.  SUBJECTIVE:  Tolerating pressure support  VITAL SIGNS: T 97.1, HR 62, RR 15, BP 174/68, SpO2 95%  PHYSICAL EXAMINATION: General: sitting in chair Neuro: alert, follows commands HEENT: trach site clean Cardiovascular: regular, no murmur Lungs: faint b/l crackles Abdomen:  Soft, non tender Musculoskeletal: ankle edema Skin: no rashes   CMP Latest Ref Rng 01/17/2015 01/15/2015 01/14/2015  Glucose 65 - 99 mg/dL 118(H) 149(H) 154(H)  BUN 6 - 20 mg/dL 56(H) 57(H) 50(H)  Creatinine 0.61 - 1.24 mg/dL 1.48(H) 1.65(H) 1.45(H)  Sodium 135 - 145 mmol/L 142 142 143  Potassium 3.5 - 5.1 mmol/L 4.8 3.5 4.9  Chloride 101 - 111 mmol/L 107 107 110  CO2 22 - 32 mmol/L 24 27 26   Calcium 8.9 - 10.3 mg/dL 8.4(L) 8.3(L) 8.5(L)  Total Protein 6.5 - 8.1 g/dL 5.4(L) - -  Total Bilirubin 0.3 - 1.2 mg/dL 0.7 - -  Alkaline Phos 38 - 126 U/L 54 - -  AST 15 - 41 U/L 109(H) - -  ALT 17 - 63 U/L 54 - -    CBC Latest Ref Rng 01/17/2015 01/16/2015 01/15/2015  WBC 4.0 - 10.5 K/uL 8.7 - 8.7  Hemoglobin 13.0 - 17.0 g/dL 7.3(L) 7.9(L) 6.4(LL)  Hematocrit 39.0 - 52.0 % 25.0(L) 27.3(L) 22.0(L)  Platelets 150 - 400 K/uL 171 - 169    No results found.   LINES/TUBES: 11/18 Trach (DF) >> 11/28 G tube (IR) >>  CULTURES: 11/18 Sputum >> E coli (resistent to ampicillin/ancef)  STUDIES: 11/07 CT head >> Lt corona radiata infarct 11/07 Echo >> EF 55 to 60%  EVENTS: 12/01 Transfer from [facility] to [facility]  DISCUSSION: 81 yo male with acute Lt internal capsule infarct complicated by HCAP with E coli and aspiration pneumonia with ARDS.  He failed to wean from vent and required tracheostomy.  ASSESSMENT / PLAN:  Acute respiratory failure with ARDS 2nd to HCAP and aspiration PNA. Failure to wean from vent s/p  tracheostomy. Plan: - f/u CXR intermittently - pressure support weaning trials as tolerated >> doing 8 hrs of 12/5 on 12/05 - rocephin per primary team - negative fluid balance as tolerated  Hx of HTN, CAD s/p CABG, PAD, intermittent VT. Plan: - per primary team  Updated pt's wife at bedside.  D/w [facility] primary team.  Chesley Mires, MD [place] 01/19/2015, 3:34 PM Pager:  [phone] After 3pm call: [phone]

## 2015-01-20 LAB — HEMOGLOBIN AND HEMATOCRIT, BLOOD
HEMATOCRIT: 25.2 % — AB (ref 39.0–52.0)
HEMOGLOBIN: 7.1 g/dL — AB (ref 13.0–17.0)

## 2015-01-21 ENCOUNTER — Other Ambulatory Visit (HOSPITAL_COMMUNITY): Payer: Self-pay

## 2015-01-21 LAB — CBC
HEMATOCRIT: 25.2 % — AB (ref 39.0–52.0)
HEMOGLOBIN: 7.2 g/dL — AB (ref 13.0–17.0)
MCH: 28.5 pg (ref 26.0–34.0)
MCHC: 28.6 g/dL — AB (ref 30.0–36.0)
MCV: 99.6 fL (ref 78.0–100.0)
Platelets: 208 10*3/uL (ref 150–400)
RBC: 2.53 MIL/uL — AB (ref 4.22–5.81)
RDW: 21.2 % — ABNORMAL HIGH (ref 11.5–15.5)
WBC: 8.8 10*3/uL (ref 4.0–10.5)

## 2015-01-21 LAB — BASIC METABOLIC PANEL
Anion gap: 9 (ref 5–15)
BUN: 38 mg/dL — AB (ref 6–20)
CHLORIDE: 105 mmol/L (ref 101–111)
CO2: 28 mmol/L (ref 22–32)
CREATININE: 1.36 mg/dL — AB (ref 0.61–1.24)
Calcium: 8.7 mg/dL — ABNORMAL LOW (ref 8.9–10.3)
GFR calc non Af Amer: 47 mL/min — ABNORMAL LOW (ref >60)
GFR, EST AFRICAN AMERICAN: 55 mL/min — AB (ref >60)
Glucose, Bld: 139 mg/dL — ABNORMAL HIGH (ref 65–99)
POTASSIUM: 4.5 mmol/L (ref 3.5–5.1)
SODIUM: 142 mmol/L (ref 135–145)

## 2015-01-22 DIAGNOSIS — J9621 Acute and chronic respiratory failure with hypoxia: Secondary | ICD-10-CM

## 2015-01-22 NOTE — Progress Notes (Signed)
   Name: Darius Barber MRN: [id number] DOB: [birth year]    ADMISSION DATE:  01/15/2015 CONSULTATION DATE:  01/16/2015  REFERRING MD :  SSH-MD  CHIEF COMPLAINT:  Respiratory failure and vent management.  SUBJECTIVE:  Denies chest pain.  Breathing okay.  VITAL SIGNS: T 97.9, HR 82, RR 21, BP 154/62, SpO2 96%  PHYSICAL EXAMINATION: General: laying in bed Neuro: alert, follows commands HEENT: trach site clean Cardiovascular: regular, no murmur Lungs: no wheeze Abdomen:  Soft, non tender Musculoskeletal: ankle edema, Rt arm in wrap Skin: no rashes   CMP Latest Ref Rng 01/21/2015 01/17/2015 01/15/2015  Glucose 65 - 99 mg/dL 139(H) 118(H) 149(H)  BUN 6 - 20 mg/dL 38(H) 56(H) 57(H)  Creatinine 0.61 - 1.24 mg/dL 1.36(H) 1.48(H) 1.65(H)  Sodium 135 - 145 mmol/L 142 142 142  Potassium 3.5 - 5.1 mmol/L 4.5 4.8 3.5  Chloride 101 - 111 mmol/L 105 107 107  CO2 22 - 32 mmol/L 28 24 27   Calcium 8.9 - 10.3 mg/dL 8.7(L) 8.4(L) 8.3(L)  Total Protein 6.5 - 8.1 g/dL - 5.4(L) -  Total Bilirubin 0.3 - 1.2 mg/dL - 0.7 -  Alkaline Phos 38 - 126 U/L - 54 -  AST 15 - 41 U/L - 109(H) -  ALT 17 - 63 U/L - 54 -    CBC Latest Ref Rng 01/21/2015 01/20/2015 01/17/2015  WBC 4.0 - 10.5 K/uL 8.8 - 8.7  Hemoglobin 13.0 - 17.0 g/dL 7.2(L) 7.1(L) 7.3(L)  Hematocrit 39.0 - 52.0 % 25.2(L) 25.2(L) 25.0(L)  Platelets 150 - 400 K/uL 208 - 171    Dg Chest Port 1 View  01/21/2015  CLINICAL DATA:  Ventilator dependent respiratory failure, ARDS EXAM: PORTABLE CHEST 1 VIEW COMPARISON:  Portable chest x-ray of January 16, 2015 FINDINGS: The lungs are adequately inflated. Diffusely increased interstitial lung markings with areas of alveolar opacity persist. The left hemidiaphragm remains partially obscured. There is no pneumothorax. The cardiac silhouette remains enlarged. The pulmonary vascularity is indistinct. There are post CABG changes. The tracheostomy appliance tip lies approximately 5 mm above superior margin of the  clavicular heads. IMPRESSION: Allowing for differences in positioning, there has not been significant interval change in the appearance of the bilateral interstitial and alveolar opacities consistent with ARDS and/or pneumonia. Electronically Signed   By: David  Martinique M.D.   On: 01/21/2015 07:45     LINES/TUBES: 11/18 Trach (DF) >> 11/28 G tube (IR) >>  CULTURES: 11/18 Sputum >> E coli (resistent to ampicillin/ancef)  STUDIES: 11/07 CT head >> Lt corona radiata infarct 11/07 Echo >> EF 55 to 60%  EVENTS: 12/01 Transfer from [facility] to [facility]  DISCUSSION: 81 yo male with acute Lt internal capsule infarct complicated by HCAP with E coli and aspiration pneumonia with ARDS.  He failed to wean from vent and required tracheostomy.  ASSESSMENT / PLAN:  Acute respiratory failure with ARDS 2nd to HCAP and aspiration PNA. Failure to wean from vent s/p tracheostomy. Plan: - f/u CXR intermittently - pressure support weaning trials as tolerated per [facility] protocol - rocephin per primary team - negative fluid balance as tolerated  Hx of HTN, CAD s/p CABG, PAD, intermittent VT. Plan: - per primary team  D[facility]w [facility] primary team.  Chesley Mires, MD [place] 01/22/2015, 9:13 AM Pager:  [phone] After 3pm call: [phone]

## 2015-01-23 LAB — MAGNESIUM: MAGNESIUM: 2.2 mg/dL (ref 1.7–2.4)

## 2015-01-23 LAB — BASIC METABOLIC PANEL
Anion gap: 6 (ref 5–15)
BUN: 51 mg/dL — AB (ref 6–20)
CALCIUM: 8.5 mg/dL — AB (ref 8.9–10.3)
CO2: 31 mmol/L (ref 22–32)
CREATININE: 1.68 mg/dL — AB (ref 0.61–1.24)
Chloride: 103 mmol/L (ref 101–111)
GFR calc Af Amer: 42 mL/min — ABNORMAL LOW (ref >60)
GFR, EST NON AFRICAN AMERICAN: 37 mL/min — AB (ref >60)
Glucose, Bld: 127 mg/dL — ABNORMAL HIGH (ref 65–99)
Potassium: 5.3 mmol/L — ABNORMAL HIGH (ref 3.5–5.1)
SODIUM: 140 mmol/L (ref 135–145)

## 2015-01-23 LAB — CBC
HCT: 21.2 % — ABNORMAL LOW (ref 39.0–52.0)
HCT: 23.4 % — ABNORMAL LOW (ref 39.0–52.0)
Hemoglobin: 6.2 g/dL — CL (ref 13.0–17.0)
Hemoglobin: 6.9 g/dL — CL (ref 13.0–17.0)
MCH: 28.8 pg (ref 26.0–34.0)
MCH: 29.1 pg (ref 26.0–34.0)
MCHC: 29.2 g/dL — AB (ref 30.0–36.0)
MCHC: 29.5 g/dL — AB (ref 30.0–36.0)
MCV: 98.6 fL (ref 78.0–100.0)
MCV: 98.7 fL (ref 78.0–100.0)
PLATELETS: 184 10*3/uL (ref 150–400)
Platelets: 212 10*3/uL (ref 150–400)
RBC: 2.15 MIL/uL — ABNORMAL LOW (ref 4.22–5.81)
RBC: 2.37 MIL/uL — ABNORMAL LOW (ref 4.22–5.81)
RDW: 20.7 % — ABNORMAL HIGH (ref 11.5–15.5)
RDW: 20.9 % — AB (ref 11.5–15.5)
WBC: 9.4 10*3/uL (ref 4.0–10.5)
WBC: 11.5 10*3/uL — ABNORMAL HIGH (ref 4.0–10.5)

## 2015-01-23 LAB — PREPARE RBC (CROSSMATCH)

## 2015-01-24 LAB — TYPE AND SCREEN
ABO/RH(D): O POS
ANTIBODY SCREEN: NEGATIVE
UNIT DIVISION: 0

## 2015-01-24 LAB — BASIC METABOLIC PANEL
ANION GAP: 9 (ref 5–15)
BUN: 46 mg/dL — ABNORMAL HIGH (ref 6–20)
CO2: 29 mmol/L (ref 22–32)
Calcium: 8.8 mg/dL — ABNORMAL LOW (ref 8.9–10.3)
Chloride: 102 mmol/L (ref 101–111)
Creatinine, Ser: 1.3 mg/dL — ABNORMAL HIGH (ref 0.61–1.24)
GFR calc Af Amer: 58 mL/min — ABNORMAL LOW (ref >60)
GFR, EST NON AFRICAN AMERICAN: 50 mL/min — AB (ref >60)
GLUCOSE: 154 mg/dL — AB (ref 65–99)
POTASSIUM: 4.8 mmol/L (ref 3.5–5.1)
Sodium: 140 mmol/L (ref 135–145)

## 2015-01-24 LAB — CBC
HEMATOCRIT: 28.5 % — AB (ref 39.0–52.0)
HEMOGLOBIN: 8.4 g/dL — AB (ref 13.0–17.0)
MCH: 28.3 pg (ref 26.0–34.0)
MCHC: 29.5 g/dL — ABNORMAL LOW (ref 30.0–36.0)
MCV: 96 fL (ref 78.0–100.0)
PLATELETS: 234 10*3/uL (ref 150–400)
RBC: 2.97 MIL/uL — AB (ref 4.22–5.81)
RDW: 20.1 % — ABNORMAL HIGH (ref 11.5–15.5)
WBC: 12.5 10*3/uL — AB (ref 4.0–10.5)

## 2015-01-25 ENCOUNTER — Institutional Professional Consult (permissible substitution) (HOSPITAL_COMMUNITY): Payer: Self-pay

## 2015-01-25 LAB — HEMOGLOBIN AND HEMATOCRIT, BLOOD
HEMATOCRIT: 26 % — AB (ref 39.0–52.0)
HEMOGLOBIN: 7.8 g/dL — AB (ref 13.0–17.0)

## 2015-01-26 ENCOUNTER — Other Ambulatory Visit (HOSPITAL_COMMUNITY): Payer: Self-pay

## 2015-01-26 NOTE — Progress Notes (Signed)
Name: Darius Barber MRN: [id number] DOB: 02/24/1931    ADMISSION DATE:  01/15/2015 CONSULTATION DATE:  01/16/2015  REFERRING MD :  SSH-MD  CHIEF COMPLAINT:  Respiratory failure and vent management.  SUBJECTIVE:  Has been weaning well on PS last couple of days. 16 and 12 hours respectively. Has currently been on PS for 5 hours. Seems to be tolerating well.   VITAL SIGNS: T 98, HR 85, RR 19, BP 149/67, SpO2 100%  PHYSICAL EXAMINATION: General: laying in bed Neuro: alert, follows commands HEENT: trach site clean Cardiovascular: regular, no murmur Lungs: coarse bilateral breath sounds Abdomen:  Soft, non tender Musculoskeletal: ankle edema, Rt arm in wrap Skin: no rashes   CMP Latest Ref Rng 01/24/2015 01/23/2015 01/21/2015  Glucose 65 - 99 mg/dL 154(H) 127(H) 139(H)  BUN 6 - 20 mg/dL 46(H) 51(H) 38(H)  Creatinine 0.61 - 1.24 mg/dL 1.30(H) 1.68(H) 1.36(H)  Sodium 135 - 145 mmol/L 140 140 142  Potassium 3.5 - 5.1 mmol/L 4.8 5.3(H) 4.5  Chloride 101 - 111 mmol/L 102 103 105  CO2 22 - 32 mmol/L 29 31 28   Calcium 8.9 - 10.3 mg/dL 8.8(L) 8.5(L) 8.7(L)  Total Protein 6.5 - 8.1 g/dL - - -  Total Bilirubin 0.3 - 1.2 mg/dL - - -  Alkaline Phos 38 - 126 U/L - - -  AST 15 - 41 U/L - - -  ALT 17 - 63 U/L - - -    CBC Latest Ref Rng 01/25/2015 01/24/2015 01/23/2015  WBC 4.0 - 10.5 K/uL - 12.5(H) 11.5(H)  Hemoglobin 13.0 - 17.0 g/dL 7.8(L) 8.4(L) 6.9(LL)  Hematocrit 39.0 - 52.0 % 26.0(L) 28.5(L) 23.4(L)  Platelets 150 - 400 K/uL - 234 212    Dg Chest Port 1 View  01/26/2015  CLINICAL DATA:  Respiratory failure EXAM: PORTABLE CHEST - 1 VIEW COMPARISON:  01/25/2015 FINDINGS: Tracheostomy tube is noted in place. Cardiac shadow is stable. Postoperative changes are again seen. Diffuse bilateral alveolar opacities are again seen and stable. No new focal abnormality is noted. No bony abnormality is seen. IMPRESSION: No change from the prior exam. Diffuse alveolar opacities are again seen.  Electronically Signed   By: Inez Catalina M.D.   On: 01/26/2015 09:46   Dg Chest Port 1 View  01/25/2015  CLINICAL DATA:  Respiratory failure.  Colon carcinoma EXAM: PORTABLE CHEST 1 VIEW COMPARISON:  January 21, 2015 FINDINGS: Tracheostomy catheter tip is 5.8 cm above the carina. No pneumothorax. There is widespread interstitial alveolar opacity, stable. Heart is mildly enlarged with pulmonary venous hypertension. There are surgical clips in the left upper thoracic region. Patient is also status post coronary artery bypass grafting. IMPRESSION: No pneumothorax. Widespread interstitial alveolar opacity. Suspect a degree of ARDS. There may well be superimposed congestive heart failure and/ or pneumonia. Within 1 of these entities may exist concurrently. The overall appearance is essentially stable compared to most recent prior study. Electronically Signed   By: Lowella Grip III M.D.   On: 01/25/2015 08:25     LINES/TUBES: 11/18 Trach (DF) >> 11/28 G tube (IR) >>  CULTURES: 11/18 Sputum >> E coli (resistent to ampicillin/ancef)  STUDIES: 11/07 CT head >> Lt corona radiata infarct 11/07 Echo >> EF 55 to 60%  EVENTS: 12/01 Transfer from [facility] to [facility]  DISCUSSION: 81 yo male with acute Lt internal capsule infarct complicated by HCAP with E coli and aspiration pneumonia with ARDS.  He failed to wean from vent and required tracheostomy.  ASSESSMENT / PLAN:  Acute respiratory failure with ARDS 2nd to HCAP and aspiration PNA. Failure to wean from vent s/p tracheostomy. Plan: - f/u CXR intermittently - Wean per [facility] protocol, going to try ATC for first time today. Has been tolerating PSV well.  - rocephin per primary team - negative fluid balance as tolerated - Will add lasix IV 20 mg BID - BMP in AM, monitor renal function  Hx of HTN, CAD s/p CABG, PAD, intermittent VT. Plan: - per primary team  Georgann Housekeeper, AGACNP-BC [place] Pulmonology/Critical Care Pager [phone] or [phone]  01/26/2015 11:45 AM   STAFF NOTE: Linwood Dibbles, MD FACP have personally reviewed patient's available data, including medical history, events of note, physical examination and test results as part of my evaluation. I have discussed with resident/NP and other care providers such as pharmacist, RN and RRT. In addition, I personally evaluated patient and elicited key findings of: pcxr is horrible, severe ards, ronchi on examination, PS 12 still with rr elevation, unable to reduce PS further as of now, maximize neg balance with lasix, would push PS 12/5 to goal 12 hours when able and not reduce PS or trach collar as of now  Lavon Paganini. Titus Mould, MD, [place] Pgr: [place] [facility] & Critical [facility] 01/26/2015 4:52 PM

## 2015-01-27 LAB — BASIC METABOLIC PANEL
ANION GAP: 9 (ref 5–15)
BUN: 50 mg/dL — AB (ref 6–20)
CHLORIDE: 97 mmol/L — AB (ref 101–111)
CO2: 33 mmol/L — ABNORMAL HIGH (ref 22–32)
Calcium: 9 mg/dL (ref 8.9–10.3)
Creatinine, Ser: 1.47 mg/dL — ABNORMAL HIGH (ref 0.61–1.24)
GFR, EST AFRICAN AMERICAN: 50 mL/min — AB (ref >60)
GFR, EST NON AFRICAN AMERICAN: 43 mL/min — AB (ref >60)
Glucose, Bld: 132 mg/dL — ABNORMAL HIGH (ref 65–99)
POTASSIUM: 4.2 mmol/L (ref 3.5–5.1)
SODIUM: 139 mmol/L (ref 135–145)

## 2015-01-30 NOTE — Progress Notes (Signed)
   Name: Darius Barber MRN: [id number] DOB: [birth year]    ADMISSION DATE:  01/15/2015 CONSULTATION DATE:  01/16/2015  REFERRING MD :  SSH-MD  CHIEF COMPLAINT:  Respiratory failure and vent management.  SUBJECTIVE:  In chair TC required higher O2  VITAL SIGNS: T 98, HR 70, RR 14, BP 135/60, SpO2 100%  PHYSICAL EXAMINATION: General: laying in bed Neuro: alert, follows commands simple HEENT: trach site clean Cardiovascular: regular, no murmur s1 s2  Lungs: coarse bilateral breath sounds unchanged, rr wnl Abdomen:  Soft, non tender Musculoskeletal: ankle edema Skin: no rashes   CMP Latest Ref Rng 01/27/2015 01/24/2015 01/23/2015  Glucose 65 - 99 mg/dL 132(H) 154(H) 127(H)  BUN 6 - 20 mg/dL 50(H) 46(H) 51(H)  Creatinine 0.61 - 1.24 mg/dL 1.47(H) 1.30(H) 1.68(H)  Sodium 135 - 145 mmol/L 139 140 140  Potassium 3.5 - 5.1 mmol/L 4.2 4.8 5.3(H)  Chloride 101 - 111 mmol/L 97(L) 102 103  CO2 22 - 32 mmol/L 33(H) 29 31  Calcium 8.9 - 10.3 mg/dL 9.0 8.8(L) 8.5(L)  Total Protein 6.5 - 8.1 g/dL - - -  Total Bilirubin 0.3 - 1.2 mg/dL - - -  Alkaline Phos 38 - 126 U/L - - -  AST 15 - 41 U/L - - -  ALT 17 - 63 U/L - - -    CBC Latest Ref Rng 01/25/2015 01/24/2015 01/23/2015  WBC 4.0 - 10.5 K/uL - 12.5(H) 11.5(H)  Hemoglobin 13.0 - 17.0 g/dL 7.8(L) 8.4(L) 6.9(LL)  Hematocrit 39.0 - 52.0 % 26.0(L) 28.5(L) 23.4(L)  Platelets 150 - 400 K/uL - 234 212    No results found.   LINES/TUBES: 11/18 Trach (DF) >> 11/28 G tube (IR) >>  CULTURES: 11/18 Sputum >> E coli (resistent to ampicillin/ancef)  STUDIES: 11/07 CT head >> Lt corona radiata infarct 11/07 Echo >> EF 55 to 60%  EVENTS: 12/01 Transfer from [facility] to [facility]  DISCUSSION: 81 yo male with acute Lt internal capsule infarct complicated by HCAP with E coli and aspiration pneumonia with ARDS.  He failed to wean from vent and required tracheostomy.  ASSESSMENT / PLAN:  Acute respiratory failure with ARDS 2nd to HCAP and  aspiration PNA. Failure to wean from vent s/p tracheostomy. Plan: - amazing improvements since Monday Now in a chair on TC with rr 16 good even BS i agree with RT, she has increased to 80% O2 to faciliate improved trach collar success as hypoxia from ARDS Now he is to 50% O2, as long as his work of breathing stays normal as it is now we can continued this approach I worry about residual ARDS fibrosis, so will need follo wup pcxr this week coming Update family at bedside Goal 12 hrs TC   Lavon Paganini. Titus Mould, MD, [place] Pgr: [place] [facility] Critical [facility] 01/30/2015 4:17 PM

## 2015-01-31 ENCOUNTER — Other Ambulatory Visit (HOSPITAL_COMMUNITY): Payer: Self-pay

## 2015-02-01 LAB — CBC
HCT: 25.5 % — ABNORMAL LOW (ref 39.0–52.0)
HEMOGLOBIN: 7.4 g/dL — AB (ref 13.0–17.0)
MCH: 28.5 pg (ref 26.0–34.0)
MCHC: 29 g/dL — AB (ref 30.0–36.0)
MCV: 98.1 fL (ref 78.0–100.0)
Platelets: 249 10*3/uL (ref 150–400)
RBC: 2.6 MIL/uL — AB (ref 4.22–5.81)
RDW: 18.9 % — ABNORMAL HIGH (ref 11.5–15.5)
WBC: 11.6 10*3/uL — ABNORMAL HIGH (ref 4.0–10.5)

## 2015-02-01 LAB — BASIC METABOLIC PANEL
Anion gap: 10 (ref 5–15)
BUN: 40 mg/dL — AB (ref 6–20)
CHLORIDE: 97 mmol/L — AB (ref 101–111)
CO2: 32 mmol/L (ref 22–32)
Calcium: 9.2 mg/dL (ref 8.9–10.3)
Creatinine, Ser: 1.29 mg/dL — ABNORMAL HIGH (ref 0.61–1.24)
GFR calc Af Amer: 58 mL/min — ABNORMAL LOW (ref >60)
GFR calc non Af Amer: 50 mL/min — ABNORMAL LOW (ref >60)
GLUCOSE: 146 mg/dL — AB (ref 65–99)
POTASSIUM: 4.1 mmol/L (ref 3.5–5.1)
Sodium: 139 mmol/L (ref 135–145)

## 2015-02-01 LAB — MAGNESIUM: Magnesium: 2.2 mg/dL (ref 1.7–2.4)

## 2015-02-02 NOTE — Progress Notes (Addendum)
Name: Darius Barber MRN: [id number] DOB: 12-13-1931    ADMISSION DATE:  01/15/2015 CONSULTATION DATE:  01/16/2015  REFERRING MD :  SSH-MD  CHIEF COMPLAINT:  Respiratory failure and vent management.  SUBJECTIVE:  Up in chair   VITAL SIGNS: Afebrile HR 70 bp 150/73 rr 19 sats 99% on 40%  PHYSICAL EXAMINATION: General: up in chair. Opens eyes, follows simple commands.  Neuro: right sided hemiparesis  HEENT: trach site clean Cardiovascular: regular, no murmur s1 s2  Lungs: coarse bilateral breath sounds unchanged, rr wnl Abdomen:  Soft, non tender Musculoskeletal: ankle edema Skin: no rashes   CMP Latest Ref Rng 02/01/2015 01/27/2015 01/24/2015  Glucose 65 - 99 mg/dL 146(H) 132(H) 154(H)  BUN 6 - 20 mg/dL 40(H) 50(H) 46(H)  Creatinine 0.61 - 1.24 mg/dL 1.29(H) 1.47(H) 1.30(H)  Sodium 135 - 145 mmol/L 139 139 140  Potassium 3.5 - 5.1 mmol/L 4.1 4.2 4.8  Chloride 101 - 111 mmol/L 97(L) 97(L) 102  CO2 22 - 32 mmol/L 32 33(H) 29  Calcium 8.9 - 10.3 mg/dL 9.2 9.0 8.8(L)  Total Protein 6.5 - 8.1 g/dL - - -  Total Bilirubin 0.3 - 1.2 mg/dL - - -  Alkaline Phos 38 - 126 U/L - - -  AST 15 - 41 U/L - - -  ALT 17 - 63 U/L - - -    CBC Latest Ref Rng 02/01/2015 01/25/2015 01/24/2015  WBC 4.0 - 10.5 K/uL 11.6(H) - 12.5(H)  Hemoglobin 13.0 - 17.0 g/dL 7.4(L) 7.8(L) 8.4(L)  Hematocrit 39.0 - 52.0 % 25.5(L) 26.0(L) 28.5(L)  Platelets 150 - 400 K/uL 249 - 234    No results found.   LINES/TUBES: 11/18 Trach (DF) >> 11/28 G tube (IR) >>  CULTURES: 11/18 Sputum >> E coli (resistent to ampicillin/ancef)  STUDIES: 11/07 CT head >> Lt corona radiata infarct 11/07 Echo >> EF 55 to 60%  EVENTS: 12/01 Transfer from [facility] to [facility]  DISCUSSION: 81 yo male with acute Lt internal capsule infarct complicated by HCAP with E coli and aspiration pneumonia with ARDS.  He failed to wean from vent and required tracheostomy. Now on ATC. Mental status and debilitation s/p CVA are limiting  factors to decannulation   ASSESSMENT / PLAN:  Acute respiratory failure with ARDS 2nd to HCAP and aspiration PNA. Failure to wean from vent s/p tracheostomy. Cont ATC as tolerated Cont rehab efforts No decannulation at this point We will see weekly  Erick Colace ACNP-BC [place] Pager # [phone] OR # [phone] if no answer  02/02/2015 9:23 AM    STAFF NOTE: I, Dr Ann Lions have personally reviewed patient's available data, including medical history, events of note, physical examination and test results as part of my evaluation. I have discussed with resident/NP and other care providers such as pharmacist, RN and RRT.  In addition,  I personally evaluated patient and elicited key findings of   S: RT says he is weaning 16h on ATC. Wife at bedside - feels he has come a long way  O: On ATC Deconditioned looking  A: acute on chronci resp faioure  P: wean as tolerated No decannulatin   .  Rest per NP/medical resident whose note is outlined above and that I agree with     Dr. Brand Males, M.D., [facility].C.P Pulmonary and Critical Care Medicine Staff Physician [place] [facility] Pager: [phone], If no answer or between  15:00h - 7:00h: call [phone]  [phone]  02/02/2015 3:37 PM

## 2015-02-09 DIAGNOSIS — J9620 Acute and chronic respiratory failure, unspecified whether with hypoxia or hypercapnia: Secondary | ICD-10-CM

## 2015-02-09 DIAGNOSIS — Z9911 Dependence on respirator [ventilator] status: Secondary | ICD-10-CM

## 2015-02-09 DIAGNOSIS — Z930 Tracheostomy status: Secondary | ICD-10-CM

## 2015-02-09 NOTE — Progress Notes (Signed)
   Name: Darius Barber MRN: [id number] DOB: [birth year]    ADMISSION DATE:  01/15/2015 CONSULTATION DATE:  01/16/2015  REFERRING MD :  SSH-MD  CHIEF COMPLAINT:  Respiratory failure and vent management.  SUBJECTIVE:  Up in chair   VITAL SIGNS: Afebrile HR 70 bp 150/73 rr 19 sats 99% on 40%  PHYSICAL EXAMINATION: General: up in chair. Opens eyes, follows simple commands.  Neuro: right sided hemiparesis  HEENT: trach site clean Cardiovascular: regular, no murmur s1 s2  Lungs: coarse bilateral breath sounds unchanged, rr wnl Abdomen:  Soft, non tender Musculoskeletal: ankle edema Skin: no rashes   CMP Latest Ref Rng 02/01/2015 01/27/2015 01/24/2015  Glucose 65 - 99 mg/dL 146(H) 132(H) 154(H)  BUN 6 - 20 mg/dL 40(H) 50(H) 46(H)  Creatinine 0.61 - 1.24 mg/dL 1.29(H) 1.47(H) 1.30(H)  Sodium 135 - 145 mmol/L 139 139 140  Potassium 3.5 - 5.1 mmol/L 4.1 4.2 4.8  Chloride 101 - 111 mmol/L 97(L) 97(L) 102  CO2 22 - 32 mmol/L 32 33(H) 29  Calcium 8.9 - 10.3 mg/dL 9.2 9.0 8.8(L)  Total Protein 6.5 - 8.1 g/dL - - -  Total Bilirubin 0.3 - 1.2 mg/dL - - -  Alkaline Phos 38 - 126 U/L - - -  AST 15 - 41 U/L - - -  ALT 17 - 63 U/L - - -    CBC Latest Ref Rng 02/01/2015 01/25/2015 01/24/2015  WBC 4.0 - 10.5 K/uL 11.6(H) - 12.5(H)  Hemoglobin 13.0 - 17.0 g/dL 7.4(L) 7.8(L) 8.4(L)  Hematocrit 39.0 - 52.0 % 25.5(L) 26.0(L) 28.5(L)  Platelets 150 - 400 K/uL 249 - 234    No results found.   LINES/TUBES: 11/18 Trach (DF) >> 11/28 G tube (IR) >>  CULTURES: 11/18 Sputum >> E coli (resistent to ampicillin/ancef)  STUDIES: 11/07 CT head >> Lt corona radiata infarct 11/07 Echo >> EF 55 to 60%  EVENTS: 12/01 Transfer from [facility] to [facility]  DISCUSSION: 81 yo male with acute Lt internal capsule infarct complicated by HCAP with E coli and aspiration pneumonia with ARDS.  He failed to wean from vent and required tracheostomy. Now on ATC. Mental status and debilitation s/p CVA are limiting  factors to decannulation   ASSESSMENT / PLAN:  Acute respiratory failure with ARDS 2nd to HCAP and aspiration PNA. Failure to wean from vent s/p tracheostomy. Cont ATC as tolerated Cont rehab efforts No decannulation at this point We will see weekly  Erick Colace ACNP-BC [place] Pager # [phone] OR # [phone] if no answer  02/09/2015 2:19 PM     02/09/2015 2:19 PM

## 2015-02-09 NOTE — Progress Notes (Signed)
   Name: ASLAM DAUN MRN: [id number] DOB: [birth year]    ADMISSION DATE:  01/15/2015 CONSULTATION DATE:  01/16/2015  REFERRING MD :  SSH-MD  CHIEF COMPLAINT:  Respiratory failure and vent management.  SUBJECTIVE:  Up in bed.  On Trach collar.  VITAL SIGNS: BP 142/78, RR 26, HR 82, SpO2 97%  PHYSICAL EXAMINATION: General: pleasant Neuro: right sided hemiparesis  HEENT: trach site clean Cardiovascular: regular, no murmur s1 s2  Lungs: no wheeze Abdomen:  Soft, non tender Musculoskeletal: ankle edema Skin: no rashes   CMP Latest Ref Rng 02/01/2015 01/27/2015 01/24/2015  Glucose 65 - 99 mg/dL 146(H) 132(H) 154(H)  BUN 6 - 20 mg/dL 40(H) 50(H) 46(H)  Creatinine 0.61 - 1.24 mg/dL 1.29(H) 1.47(H) 1.30(H)  Sodium 135 - 145 mmol/L 139 139 140  Potassium 3.5 - 5.1 mmol/L 4.1 4.2 4.8  Chloride 101 - 111 mmol/L 97(L) 97(L) 102  CO2 22 - 32 mmol/L 32 33(H) 29  Calcium 8.9 - 10.3 mg/dL 9.2 9.0 8.8(L)  Total Protein 6.5 - 8.1 g/dL - - -  Total Bilirubin 0.3 - 1.2 mg/dL - - -  Alkaline Phos 38 - 126 U/L - - -  AST 15 - 41 U/L - - -  ALT 17 - 63 U/L - - -    CBC Latest Ref Rng 02/01/2015 01/25/2015 01/24/2015  WBC 4.0 - 10.5 K/uL 11.6(H) - 12.5(H)  Hemoglobin 13.0 - 17.0 g/dL 7.4(L) 7.8(L) 8.4(L)  Hematocrit 39.0 - 52.0 % 25.5(L) 26.0(L) 28.5(L)  Platelets 150 - 400 K/uL 249 - 234    No results found.   LINES/TUBES: 11/18 Trach (DF) >> 11/28 G tube (IR) >>  CULTURES: 11/18 Sputum >> E coli (resistent to ampicillin/ancef)  STUDIES: 11/07 CT head >> Lt corona radiata infarct 11/07 Echo >> EF 55 to 60%  EVENTS: 12/01 Transfer from [facility] to [facility]  DISCUSSION: 81 yo male with acute Lt internal capsule infarct complicated by HCAP with E coli and aspiration pneumonia with ARDS.  He failed to wean from vent and required tracheostomy. Now on ATC. Mental status and debilitation s/p CVA are limiting factors to decannulation   ASSESSMENT / PLAN:  Acute respiratory failure with  ARDS 2nd to HCAP and aspiration PNA. Failure to wean from vent s/p tracheostomy. Plan: - try trach collar for 24 hrs on 12/26 - might still need intermittent vent support at night  Updated pt's wife at bedside.  D/w Dr. Laren Everts.  Chesley Mires, MD [facility] Pulmonary/Critical Care 02/09/2015, 3:31 PM Pager:  [phone] After 3pm call: [phone]

## 2015-02-11 LAB — COMPREHENSIVE METABOLIC PANEL
ALK PHOS: 66 U/L (ref 38–126)
ALT: 22 U/L (ref 17–63)
AST: 41 U/L (ref 15–41)
Albumin: 2.6 g/dL — ABNORMAL LOW (ref 3.5–5.0)
Anion gap: 10 (ref 5–15)
BILIRUBIN TOTAL: 0.9 mg/dL (ref 0.3–1.2)
BUN: 69 mg/dL — ABNORMAL HIGH (ref 6–20)
CALCIUM: 9.3 mg/dL (ref 8.9–10.3)
CO2: 33 mmol/L — AB (ref 22–32)
CREATININE: 1.84 mg/dL — AB (ref 0.61–1.24)
Chloride: 97 mmol/L — ABNORMAL LOW (ref 101–111)
GFR calc non Af Amer: 33 mL/min — ABNORMAL LOW (ref >60)
GFR, EST AFRICAN AMERICAN: 38 mL/min — AB (ref >60)
Glucose, Bld: 165 mg/dL — ABNORMAL HIGH (ref 65–99)
Potassium: 4 mmol/L (ref 3.5–5.1)
SODIUM: 140 mmol/L (ref 135–145)
Total Protein: 6.8 g/dL (ref 6.5–8.1)

## 2015-02-11 LAB — CBC WITH DIFFERENTIAL/PLATELET
Basophils Absolute: 0.1 10*3/uL (ref 0.0–0.1)
Basophils Relative: 1 %
EOS ABS: 0.1 10*3/uL (ref 0.0–0.7)
Eosinophils Relative: 1 %
HEMATOCRIT: 23 % — AB (ref 39.0–52.0)
HEMOGLOBIN: 6.7 g/dL — AB (ref 13.0–17.0)
LYMPHS ABS: 0.9 10*3/uL (ref 0.7–4.0)
LYMPHS PCT: 9 %
MCH: 28.4 pg (ref 26.0–34.0)
MCHC: 29.1 g/dL — ABNORMAL LOW (ref 30.0–36.0)
MCV: 97.5 fL (ref 78.0–100.0)
Monocytes Absolute: 1.4 10*3/uL — ABNORMAL HIGH (ref 0.1–1.0)
Monocytes Relative: 14 %
NEUTROS ABS: 8 10*3/uL — AB (ref 1.7–7.7)
NEUTROS PCT: 76 %
Platelets: 246 10*3/uL (ref 150–400)
RBC: 2.36 MIL/uL — AB (ref 4.22–5.81)
RDW: 19.2 % — ABNORMAL HIGH (ref 11.5–15.5)
WBC: 10.4 10*3/uL (ref 4.0–10.5)

## 2015-02-11 LAB — PREPARE RBC (CROSSMATCH)

## 2015-02-12 ENCOUNTER — Other Ambulatory Visit (HOSPITAL_COMMUNITY): Payer: Self-pay

## 2015-02-12 LAB — TYPE AND SCREEN
ABO/RH(D): O POS
ANTIBODY SCREEN: NEGATIVE
UNIT DIVISION: 0

## 2015-02-14 LAB — CULTURE, RESPIRATORY W GRAM STAIN

## 2015-02-14 LAB — CULTURE, RESPIRATORY

## 2015-02-17 ENCOUNTER — Other Ambulatory Visit (HOSPITAL_COMMUNITY): Payer: Self-pay

## 2015-02-17 LAB — BASIC METABOLIC PANEL
ANION GAP: 10 (ref 5–15)
BUN: 66 mg/dL — ABNORMAL HIGH (ref 6–20)
CALCIUM: 9 mg/dL (ref 8.9–10.3)
CHLORIDE: 100 mmol/L — AB (ref 101–111)
CO2: 33 mmol/L — AB (ref 22–32)
CREATININE: 1.51 mg/dL — AB (ref 0.61–1.24)
GFR calc non Af Amer: 42 mL/min — ABNORMAL LOW (ref >60)
GFR, EST AFRICAN AMERICAN: 48 mL/min — AB (ref >60)
Glucose, Bld: 151 mg/dL — ABNORMAL HIGH (ref 65–99)
Potassium: 4.1 mmol/L (ref 3.5–5.1)
SODIUM: 143 mmol/L (ref 135–145)

## 2015-02-17 LAB — CBC
HEMATOCRIT: 24.4 % — AB (ref 39.0–52.0)
Hemoglobin: 7 g/dL — ABNORMAL LOW (ref 13.0–17.0)
MCH: 27.5 pg (ref 26.0–34.0)
MCHC: 28.7 g/dL — ABNORMAL LOW (ref 30.0–36.0)
MCV: 95.7 fL (ref 78.0–100.0)
Platelets: 267 10*3/uL (ref 150–400)
RBC: 2.55 MIL/uL — AB (ref 4.22–5.81)
RDW: 19 % — AB (ref 11.5–15.5)
WBC: 11.8 10*3/uL — AB (ref 4.0–10.5)

## 2015-02-17 NOTE — Progress Notes (Signed)
   Name: Darius Barber MRN: [id number] DOB: [birth year]    ADMISSION DATE:  01/15/2015 CONSULTATION DATE:  01/16/2015  REFERRING MD :  SSH-MD  CHIEF COMPLAINT:  Respiratory failure and vent management.  SUBJECTIVE:  Up in bed.  On Trach collar.  VITAL SIGNS: BP Vital signs reviewed. Abnormal values will appear under impression plan section.    PHYSICAL EXAMINATION: General: pleasant Neuro: right sided hemiparesis  HEENT: trach site clean Cardiovascular: regular, no murmur s1 s2  Lungs: no wheeze, abd / c paradoxus on t collar Abdomen:  Soft, non tender Musculoskeletal: ankle edema Skin: no rashes   CMP Latest Ref Rng 02/17/2015 02/11/2015 02/01/2015  Glucose 65 - 99 mg/dL 151(H) 165(H) 146(H)  BUN 6 - 20 mg/dL 66(H) 69(H) 40(H)  Creatinine 0.61 - 1.24 mg/dL 1.51(H) 1.84(H) 1.29(H)  Sodium 135 - 145 mmol/L 143 140 139  Potassium 3.5 - 5.1 mmol/L 4.1 4.0 4.1  Chloride 101 - 111 mmol/L 100(L) 97(L) 97(L)  CO2 22 - 32 mmol/L 33(H) 33(H) 32  Calcium 8.9 - 10.3 mg/dL 9.0 9.3 9.2  Total Protein 6.5 - 8.1 g/dL - 6.8 -  Total Bilirubin 0.3 - 1.2 mg/dL - 0.9 -  Alkaline Phos 38 - 126 U/L - 66 -  AST 15 - 41 U/L - 41 -  ALT 17 - 63 U/L - 22 -    CBC Latest Ref Rng 02/17/2015 02/11/2015 02/01/2015  WBC 4.0 - 10.5 K/uL 11.8(H) 10.4 11.6(H)  Hemoglobin 13.0 - 17.0 g/dL 7.0(L) 6.7(LL) 7.4(L)  Hematocrit 39.0 - 52.0 % 24.4(L) 23.0(L) 25.5(L)  Platelets 150 - 400 K/uL 267 246 249    Dg Chest Port 1 View  02/17/2015  CLINICAL DATA:  Respiratory failure EXAM: PORTABLE CHEST 1 VIEW COMPARISON:  February 12, 2015 FINDINGS: Tracheostomy catheter tip is 6.4 cm above the carina. No pneumothorax. Mild perihilar interstitial edema remains without change. No airspace consolidation. Heart is mildly enlarged with mild pulmonary venous hypertension. Patient is status post coronary artery bypass grafting. No adenopathy. There are surgical clips at the left cervical -thoracic junction, stable.  IMPRESSION: Evidence of a degree of congestive heart failure with mild perihilar interstitial edema. No airspace consolidation. No change in cardiac silhouette. Electronically Signed   By: Lowella Grip III M.D.   On: 02/17/2015 07:37     LINES/TUBES: 11/18 Trach (DF) >> 11/28 G tube (IR) >>  CULTURES: 11/18 Sputum >> E coli (resistent to ampicillin/ancef)  STUDIES: 11/07 CT head >> Lt corona radiata infarct 11/07 Echo >> EF 55 to 60%  EVENTS: 12/01 Transfer from [facility] to [facility]  DISCUSSION: 82 yo male with acute Lt internal capsule infarct complicated by HCAP with E coli and aspiration pneumonia with ARDS.  He failed to wean from vent and required tracheostomy. Now on ATC. Mental status and debilitation s/p CVA are limiting factors to decannulation   ASSESSMENT / PLAN:  Acute respiratory failure with ARDS 2nd to HCAP and aspiration PNA. Failure to wean from vent s/p tracheostomy. Plan: - try trach collar for 24 hrs on 1/3 - Most likely will need intermittent vent support at night. On t collar he has abd/cw paradoxus coupled with stroke sxs means he will nocturnal vent support for now.  [name] [name] ACNP [name] PCCM Pager [phone] till 3 pm If no answer page [phone] 02/17/2015, 8:53 AM

## 2015-02-22 ENCOUNTER — Other Ambulatory Visit (HOSPITAL_COMMUNITY): Payer: Self-pay

## 2015-02-22 LAB — CBC
HEMATOCRIT: 31.9 % — AB (ref 39.0–52.0)
Hemoglobin: 8.4 g/dL — ABNORMAL LOW (ref 13.0–17.0)
MCH: 25.5 pg — ABNORMAL LOW (ref 26.0–34.0)
MCHC: 26.3 g/dL — ABNORMAL LOW (ref 30.0–36.0)
MCV: 96.7 fL (ref 78.0–100.0)
Platelets: 339 10*3/uL (ref 150–400)
RBC: 3.3 MIL/uL — AB (ref 4.22–5.81)
RDW: 18 % — ABNORMAL HIGH (ref 11.5–15.5)
WBC: 16.1 10*3/uL — AB (ref 4.0–10.5)

## 2015-02-22 LAB — BASIC METABOLIC PANEL
ANION GAP: 12 (ref 5–15)
BUN: 70 mg/dL — ABNORMAL HIGH (ref 6–20)
CHLORIDE: 101 mmol/L (ref 101–111)
CO2: 34 mmol/L — AB (ref 22–32)
Calcium: 9.8 mg/dL (ref 8.9–10.3)
Creatinine, Ser: 1.4 mg/dL — ABNORMAL HIGH (ref 0.61–1.24)
GFR calc Af Amer: 53 mL/min — ABNORMAL LOW (ref >60)
GFR calc non Af Amer: 46 mL/min — ABNORMAL LOW (ref >60)
GLUCOSE: 144 mg/dL — AB (ref 65–99)
POTASSIUM: 4.5 mmol/L (ref 3.5–5.1)
Sodium: 147 mmol/L — ABNORMAL HIGH (ref 135–145)

## 2015-02-22 LAB — PHOSPHORUS: PHOSPHORUS: 4.7 mg/dL — AB (ref 2.5–4.6)

## 2015-02-22 LAB — MAGNESIUM: MAGNESIUM: 2.8 mg/dL — AB (ref 1.7–2.4)

## 2015-02-23 ENCOUNTER — Other Ambulatory Visit (HOSPITAL_COMMUNITY): Payer: Medicare HMO

## 2015-02-24 LAB — HEPATITIS C ANTIBODY: HCV Ab: <0.1 s/co ratio (ref 0.0–0.9)

## 2015-02-24 LAB — HEPATITIS B CORE ANTIBODY, TOTAL: HEP B C TOTAL AB: NEGATIVE

## 2015-02-26 NOTE — Progress Notes (Signed)
   Name: Darius Barber MRN: [id number] DOB: 1931-04-09    ADMISSION DATE:  01/15/2015 CONSULTATION DATE:  01/16/2015  REFERRING MD :  SSH-MD  CHIEF COMPLAINT:  Respiratory failure and vent management.  SUBJECTIVE:  Sitting up in chair.  VITAL SIGNS: T 98.6, HR 86, RR 22, BP 125/62, SpO2 99%  PHYSICAL EXAMINATION: General: pleasant Neuro: right sided hemiparesis  HEENT: trach site clean Cardiovascular: regular, no murmur s1 s2  Lungs: no wheeze Abdomen:  Soft, non tender Musculoskeletal: ankle edema Skin: no rashes   CMP Latest Ref Rng 02/22/2015 02/17/2015 02/11/2015  Glucose 65 - 99 mg/dL 144(H) 151(H) 165(H)  BUN 6 - 20 mg/dL 70(H) 66(H) 69(H)  Creatinine 0.61 - 1.24 mg/dL 1.40(H) 1.51(H) 1.84(H)  Sodium 135 - 145 mmol/L 147(H) 143 140  Potassium 3.5 - 5.1 mmol/L 4.5 4.1 4.0  Chloride 101 - 111 mmol/L 101 100(L) 97(L)  CO2 22 - 32 mmol/L 34(H) 33(H) 33(H)  Calcium 8.9 - 10.3 mg/dL 9.8 9.0 9.3  Total Protein 6.5 - 8.1 g/dL - - 6.8  Total Bilirubin 0.3 - 1.2 mg/dL - - 0.9  Alkaline Phos 38 - 126 U/L - - 66  AST 15 - 41 U/L - - 41  ALT 17 - 63 U/L - - 22    CBC Latest Ref Rng 02/22/2015 02/17/2015 02/11/2015  WBC 4.0 - 10.5 K/uL 16.1(H) 11.8(H) 10.4  Hemoglobin 13.0 - 17.0 g/dL 8.4(L) 7.0(L) 6.7(LL)  Hematocrit 39.0 - 52.0 % 31.9(L) 24.4(L) 23.0(L)  Platelets 150 - 400 K/uL 339 267 246    No results found.   LINES/TUBES: 11/18 Trach (DF) >> 11/28 G tube (IR) >>  CULTURES: 11/18 Sputum >> E coli (resistent to ampicillin/ancef)  STUDIES: 11/07 CT head >> Lt corona radiata infarct 11/07 Echo >> EF 55 to 60%  EVENTS: 12/01 Transfer from [facility]  DISCUSSION: 82 yo male with acute Lt internal capsule infarct complicated by HCAP with E coli and aspiration pneumonia with ARDS.  He failed to wean from vent and required tracheostomy. Now on ATC. Mental status and debilitation s/p CVA are limiting factors to decannulation   ASSESSMENT / PLAN:  Acute respiratory  failure with ARDS 2nd to HCAP and aspiration PNA. Failure to wean from vent s/p tracheostomy. Plan: - tray collar as tolerated >> not candidate for decannulation due to CVA, deconditioning, and difficulty controlling secretions - add robinul 1/12  Updated pt's family at bedside.  Chesley Mires, MD [facility] Pulmonary/Critical Care 02/26/2015, 12:29 PM Pager:  [phone] After 3pm call: [phone]

## 2015-03-01 LAB — CBC
HCT: 41.5 % (ref 39.0–52.0)
HEMOGLOBIN: 11.1 g/dL — AB (ref 13.0–17.0)
MCH: 25.6 pg — AB (ref 26.0–34.0)
MCHC: 26.7 g/dL — AB (ref 30.0–36.0)
MCV: 95.8 fL (ref 78.0–100.0)
PLATELETS: 238 10*3/uL (ref 150–400)
RBC: 4.33 MIL/uL (ref 4.22–5.81)
RDW: 17.7 % — AB (ref 11.5–15.5)
WBC: 10.8 10*3/uL — ABNORMAL HIGH (ref 4.0–10.5)

## 2015-03-01 LAB — BASIC METABOLIC PANEL
Anion gap: 13 (ref 5–15)
BUN: 97 mg/dL — AB (ref 6–20)
CALCIUM: 10.2 mg/dL (ref 8.9–10.3)
CHLORIDE: 108 mmol/L (ref 101–111)
CO2: 35 mmol/L — ABNORMAL HIGH (ref 22–32)
CREATININE: 1.57 mg/dL — AB (ref 0.61–1.24)
GFR calc non Af Amer: 40 mL/min — ABNORMAL LOW (ref >60)
GFR, EST AFRICAN AMERICAN: 46 mL/min — AB (ref >60)
Glucose, Bld: 170 mg/dL — ABNORMAL HIGH (ref 65–99)
Potassium: 4 mmol/L (ref 3.5–5.1)
SODIUM: 156 mmol/L — AB (ref 135–145)

## 2015-03-02 DIAGNOSIS — R0902 Hypoxemia: Secondary | ICD-10-CM

## 2015-03-02 LAB — BASIC METABOLIC PANEL WITH GFR
Anion gap: 11 (ref 5–15)
BUN: 102 mg/dL — ABNORMAL HIGH (ref 6–20)
CO2: 38 mmol/L — ABNORMAL HIGH (ref 22–32)
Calcium: 10.1 mg/dL (ref 8.9–10.3)
Chloride: 107 mmol/L (ref 101–111)
Creatinine, Ser: 1.79 mg/dL — ABNORMAL HIGH (ref 0.61–1.24)
GFR calc Af Amer: 39 mL/min — ABNORMAL LOW
GFR calc non Af Amer: 34 mL/min — ABNORMAL LOW
Glucose, Bld: 181 mg/dL — ABNORMAL HIGH (ref 65–99)
Potassium: 4.1 mmol/L (ref 3.5–5.1)
Sodium: 156 mmol/L — ABNORMAL HIGH (ref 135–145)

## 2015-03-02 NOTE — Progress Notes (Signed)
   Name: Darius Barber MRN: [id number] DOB: 10-Nov-1931    ADMISSION DATE:  01/15/2015 CONSULTATION DATE:  01/16/2015  REFERRING MD :  SSH-MD  CHIEF COMPLAINT:  Respiratory failure and vent management.  SUBJECTIVE:  Sitting up in chair. 28% O2 via ATC tolerating well. Still trouble with secretions.   VITAL SIGNS: T 98.8, HR 86, RR 20, BP 138/76, SpO2 99%  PHYSICAL EXAMINATION: General: pleasant Neuro: right sided hemiparesis  HEENT: trach site clean Cardiovascular: regular, no murmur s1 s2  Lungs: no wheeze Abdomen:  Soft, non tender Musculoskeletal: ankle edema Skin: no rashes   CMP Latest Ref Rng 03/02/2015 03/01/2015 02/22/2015  Glucose 65 - 99 mg/dL 181(H) 170(H) 144(H)  BUN 6 - 20 mg/dL 102(H) 97(H) 70(H)  Creatinine 0.61 - 1.24 mg/dL 1.79(H) 1.57(H) 1.40(H)  Sodium 135 - 145 mmol/L 156(H) 156(H) 147(H)  Potassium 3.5 - 5.1 mmol/L 4.1 4.0 4.5  Chloride 101 - 111 mmol/L 107 108 101  CO2 22 - 32 mmol/L 38(H) 35(H) 34(H)  Calcium 8.9 - 10.3 mg/dL 10.1 10.2 9.8  Total Protein 6.5 - 8.1 g/dL - - -  Total Bilirubin 0.3 - 1.2 mg/dL - - -  Alkaline Phos 38 - 126 U/L - - -  AST 15 - 41 U/L - - -  ALT 17 - 63 U/L - - -    CBC Latest Ref Rng 03/01/2015 02/22/2015 02/17/2015  WBC 4.0 - 10.5 K/uL 10.8(H) 16.1(H) 11.8(H)  Hemoglobin 13.0 - 17.0 g/dL 11.1(L) 8.4(L) 7.0(L)  Hematocrit 39.0 - 52.0 % 41.5 31.9(L) 24.4(L)  Platelets 150 - 400 K/uL 238 339 267    No results found.   LINES/TUBES: 11/18 Trach (DF) >> 11/28 G tube (IR) >>  CULTURES: 11/18 Sputum >> E coli (resistent to ampicillin/ancef)  STUDIES: 11/07 CT head >> Lt corona radiata infarct 11/07 Echo >> EF 55 to 60%  EVENTS: 12/01 Transfer from [facility] to [facility]  DISCUSSION: 82 yo male with acute Lt internal capsule infarct complicated by HCAP with E coli and aspiration pneumonia with ARDS.  He failed to wean from vent and required tracheostomy. Now on ATC. Mental status and debilitation s/p CVA are limiting factors to  decannulation   ASSESSMENT / PLAN:  Acute respiratory failure with ARDS 2nd to HCAP and aspiration PNA. Failure to wean from vent s/p tracheostomy. Plan: - Continue trach collar 28%. - Not candidate for decannulation due to CVA, deconditioning, and difficulty controlling secretions - Continue robinul - Chest PT to help with secretions  Georgann Housekeeper, AGACNP-BC [place] Pulmonology/Critical Care Pager [phone] or [phone]  03/02/2015 3:29 PM  Attending Note:  82 year old male with acute ICH, aspirated and was intubated, trach in place.  Patient is tolerating TC 24/7 at this point.  Given neuro status on exam I would not recommend decannulation.  Lungs with coarse BS on exam.  I reviewed CXR myself, trach is in good position.  Discussed with RT and PCCM-NP.    Respiratory failure due to neuro status at this point.  - Titrate O2 for sat of 88-92%. - Begin looking for placement.  Tracheostomy status: - No decannulation given neuro status. - May change to a cuffless 6.  Increased secretion: - Robinul. - Chest PT. - Keep dry.  PCCM will sign off, please call back if needed.  Patient seen and examined, agree with above note.  I dictated the care and orders written for this patient under my direction.  Rush Farmer, MD [phone]

## 2015-03-03 LAB — BASIC METABOLIC PANEL
Anion gap: 14 (ref 5–15)
BUN: 115 mg/dL — AB (ref 6–20)
CALCIUM: 9.6 mg/dL (ref 8.9–10.3)
CO2: 32 mmol/L (ref 22–32)
CREATININE: 1.97 mg/dL — AB (ref 0.61–1.24)
Chloride: 103 mmol/L (ref 101–111)
GFR calc Af Amer: 35 mL/min — ABNORMAL LOW (ref >60)
GFR, EST NON AFRICAN AMERICAN: 30 mL/min — AB (ref >60)
Glucose, Bld: 156 mg/dL — ABNORMAL HIGH (ref 65–99)
POTASSIUM: 4.1 mmol/L (ref 3.5–5.1)
SODIUM: 149 mmol/L — AB (ref 135–145)

## 2015-03-04 LAB — BASIC METABOLIC PANEL
Anion gap: 10 (ref 5–15)
BUN: 104 mg/dL — ABNORMAL HIGH (ref 6–20)
CHLORIDE: 101 mmol/L (ref 101–111)
CO2: 32 mmol/L (ref 22–32)
CREATININE: 1.77 mg/dL — AB (ref 0.61–1.24)
Calcium: 9.1 mg/dL (ref 8.9–10.3)
GFR calc Af Amer: 40 mL/min — ABNORMAL LOW (ref >60)
GFR calc non Af Amer: 34 mL/min — ABNORMAL LOW (ref >60)
Glucose, Bld: 142 mg/dL — ABNORMAL HIGH (ref 65–99)
Potassium: 4 mmol/L (ref 3.5–5.1)
SODIUM: 143 mmol/L (ref 135–145)

## 2015-03-08 LAB — BASIC METABOLIC PANEL
Anion gap: 7 (ref 5–15)
BUN: 58 mg/dL — AB (ref 6–20)
CALCIUM: 8.6 mg/dL — AB (ref 8.9–10.3)
CO2: 35 mmol/L — AB (ref 22–32)
CREATININE: 1.41 mg/dL — AB (ref 0.61–1.24)
Chloride: 98 mmol/L — ABNORMAL LOW (ref 101–111)
GFR calc non Af Amer: 45 mL/min — ABNORMAL LOW (ref >60)
GFR, EST AFRICAN AMERICAN: 52 mL/min — AB (ref >60)
GLUCOSE: 135 mg/dL — AB (ref 65–99)
Potassium: 4 mmol/L (ref 3.5–5.1)
Sodium: 140 mmol/L (ref 135–145)

## 2015-03-09 LAB — CULTURE, RESPIRATORY W GRAM STAIN

## 2015-03-09 LAB — CULTURE, RESPIRATORY

## 2015-04-15 DEATH — deceased

## 2015-10-27 ENCOUNTER — Encounter (HOSPITAL_COMMUNITY): Payer: Medicare HMO

## 2015-10-27 ENCOUNTER — Ambulatory Visit: Payer: Medicare HMO | Admitting: Family

## 2016-10-26 IMAGING — CR DG CHEST 1V PORT
1 series · 1 of 1 positions shown · non-contrast
Comparison: 12/22/2014 and CT chest 12/21/2014.

CLINICAL DATA: Respiratory distress, patient on ventilator.
Respiratory failure.

EXAM:
PORTABLE CHEST 1 VIEW

[AP]
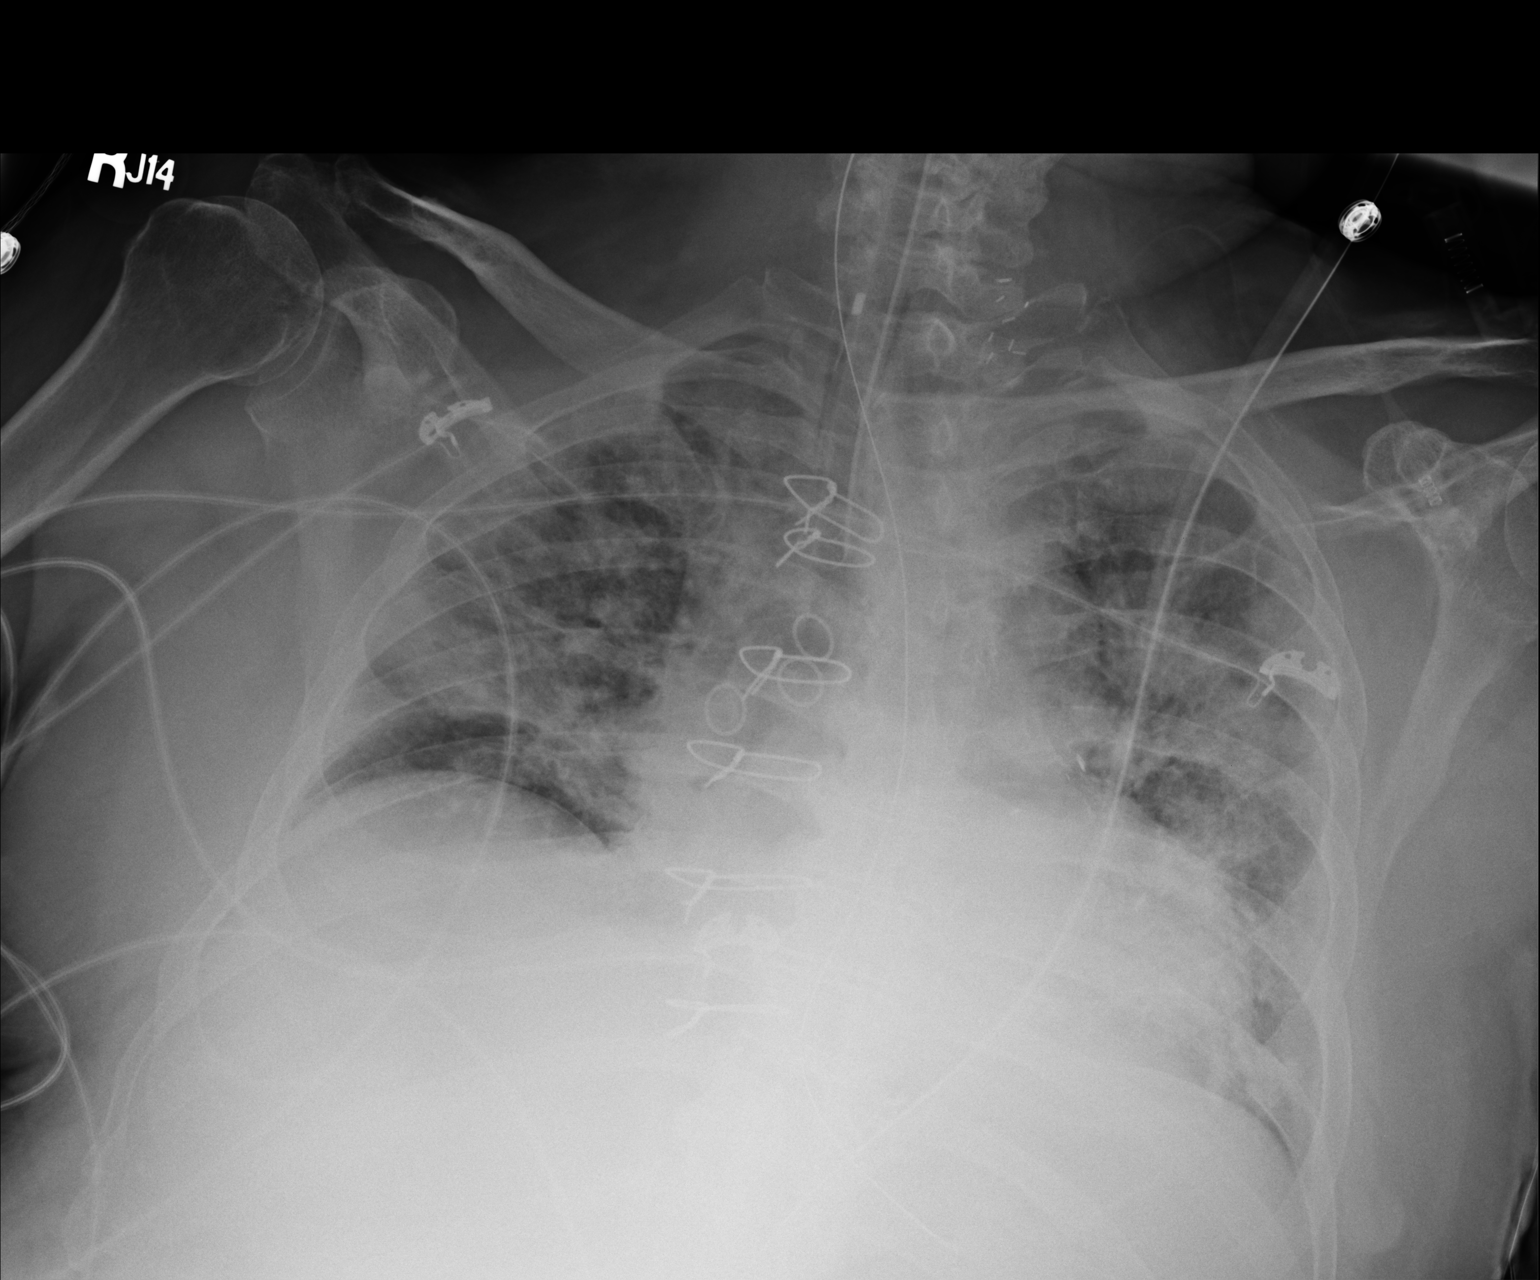

[1 of 1 positions shown; findings below may reference images not displayed]

FINDINGS: Endotracheal tube terminates 2.7 cm above the carina. Nasogastric
tube is followed into the stomach. Heart size is grossly stable.
Lungs are low in volume with worsening bilateral airspace
opacification. No definite pleural fluid.
IMPRESSION: Worsening bilateral airspace opacification may be due to edema.
Pneumonia is not excluded.

## 2016-10-28 IMAGING — CR DG CHEST 1V PORT
1 series · 1 of 1 positions shown · non-contrast
Comparison: 12/25/2014

CLINICAL DATA: Evaluate course of aspiration pneumonia. intubated.
NG tube reposition.

EXAM:
PORTABLE CHEST 1 VIEW

[AP]
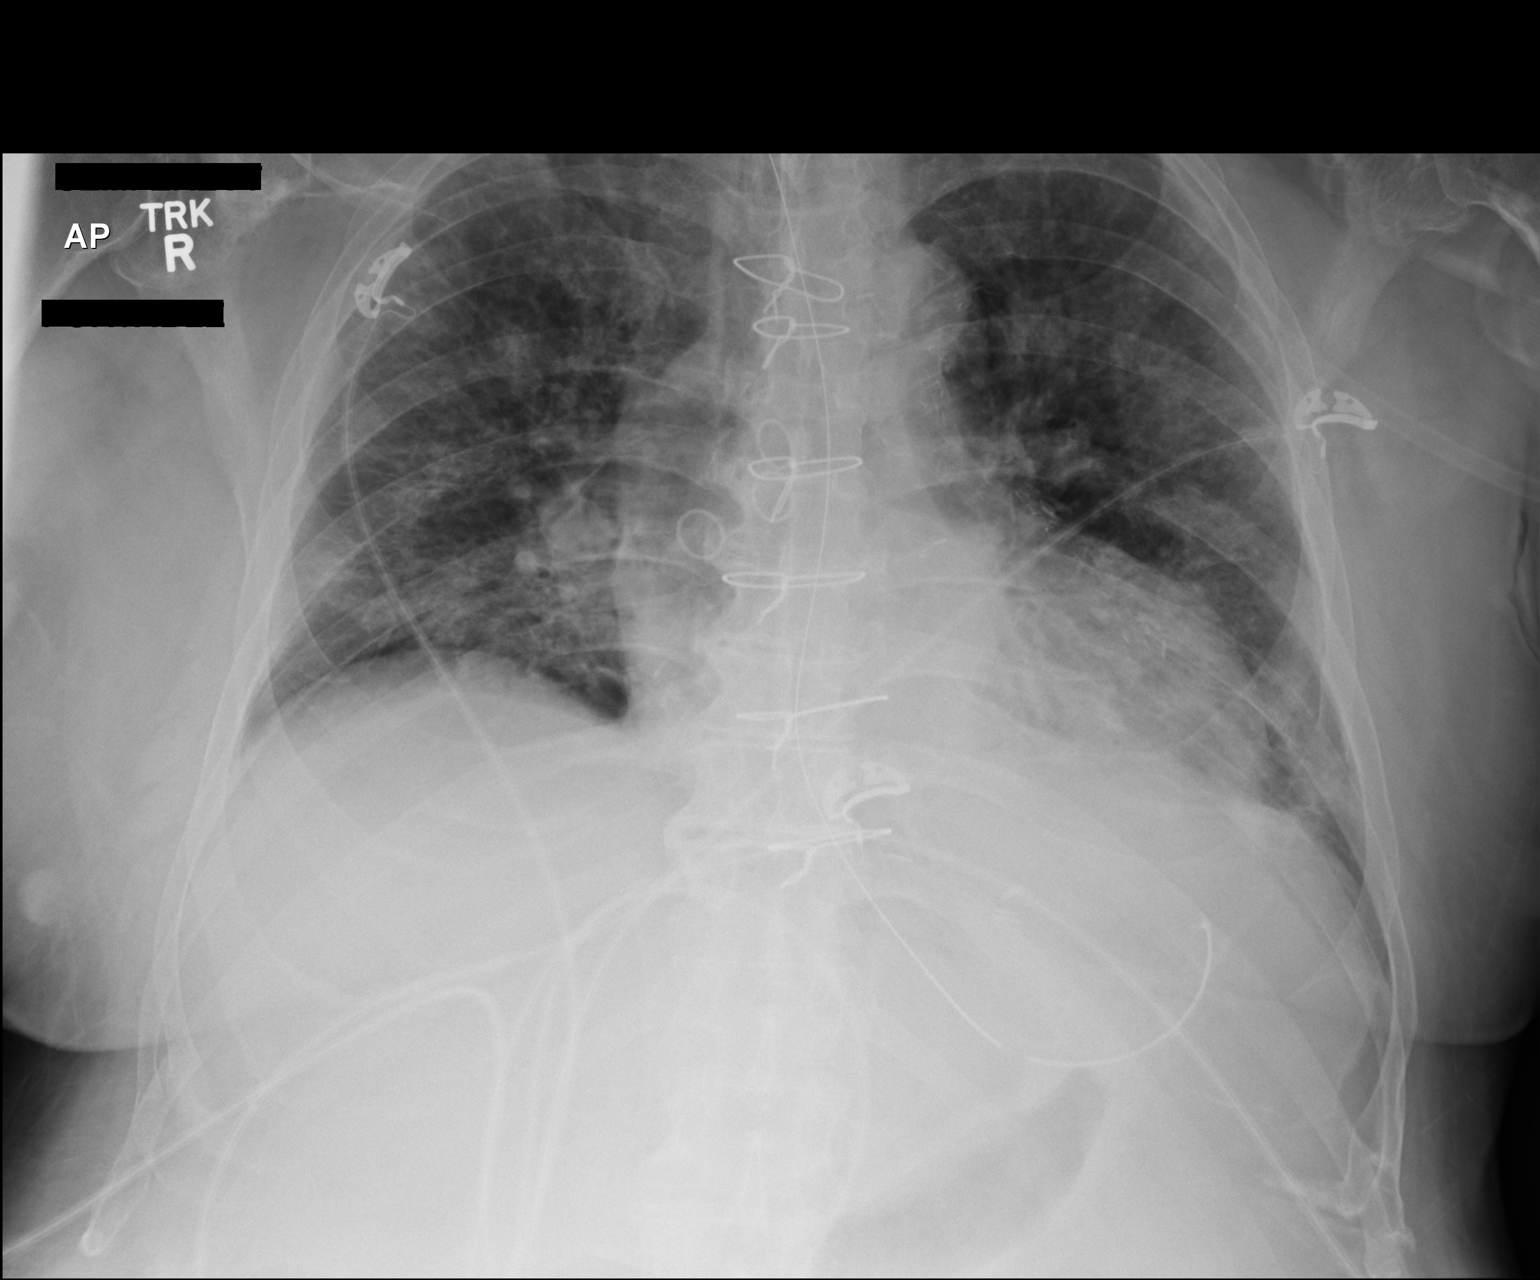

[1 of 1 positions shown; findings below may reference images not displayed]

FINDINGS: NG tube has been advanced into the proximal stomach. Endotracheal
soon is 5 cm above the carina. Mild cardiomegaly. Vascular
congestion. Patchy bilateral airspace opacities are slightly
improved since prior study. No visible effusions.
IMPRESSION: Endotracheal tube in stable position. A NG tube advanced into the
proximal stomach.

Slight improvement in patchy bilateral airspace disease and lung
volumes.

## 2016-10-30 IMAGING — CR DG CHEST 1V PORT
1 series · 1 of 1 positions shown · non-contrast
Comparison: 12/27/2014

CLINICAL DATA: Respiratory failure.  Subsequent encounter.

EXAM:
PORTABLE CHEST 1 VIEW

[AP]
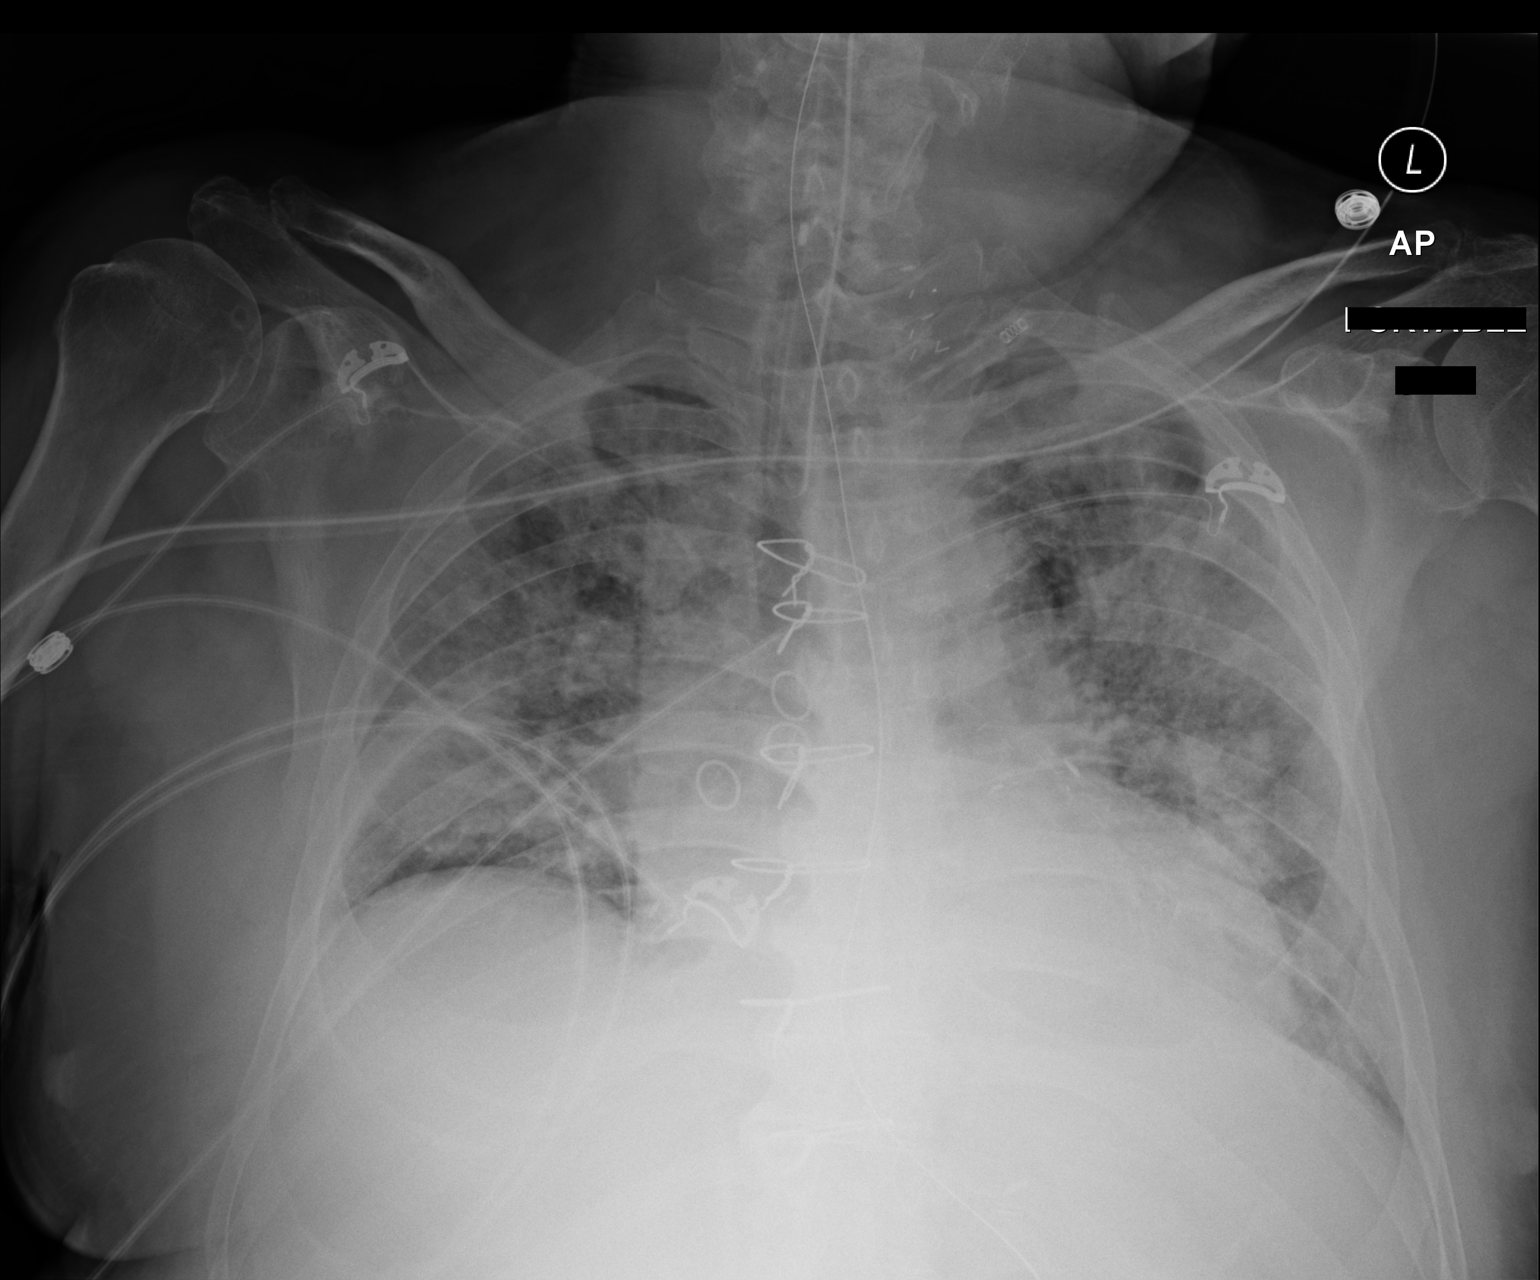

[1 of 1 positions shown; findings below may reference images not displayed]

FINDINGS: Bilateral patchy areas of airspace opacity are without significant
change from the previous day's study. Lung volumes remain low.

Changes from CABG surgery are stable.

Endotracheal tube and orogastric tube are stable and well
positioned.

No pneumothorax.
IMPRESSION: 1. No change from the previous day's study
2. Persistent bilateral airspace opacities most likely due to
multifocal pneumonia. Asymmetric edema is possible.
3. Support apparatus is stable and well positioned.

## 2016-11-09 IMAGING — CR DG ABD PORTABLE 1V
1 series · 1 of 1 positions shown · non-contrast
Comparison: 01/04/2015 abdominal radiograph

CLINICAL DATA: Enteric tube placement

EXAM:
PORTABLE ABDOMEN - 1 VIEW

[supine ap]
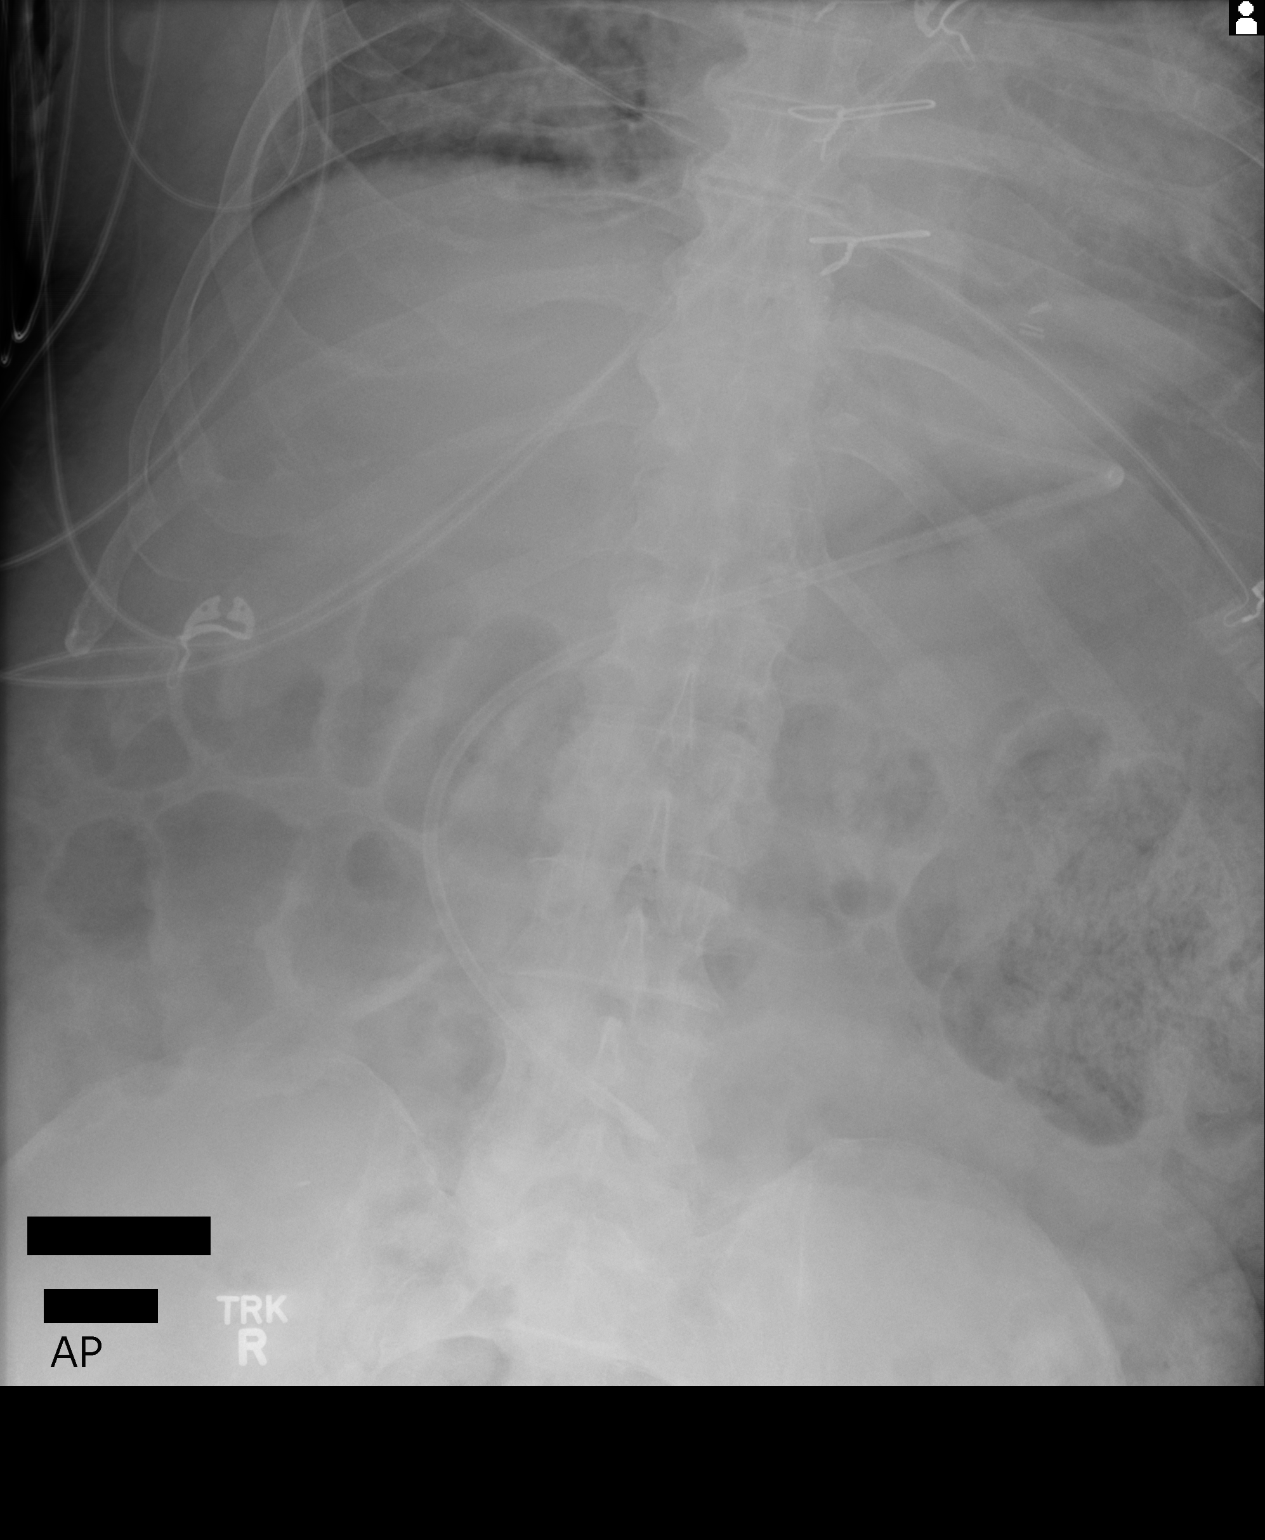

[1 of 1 positions shown; findings below may reference images not displayed]

FINDINGS: Enteric tube courses through the stomach into the duodenum and
terminates near the junction of the third and fourth portions of the
duodenum. No dilated small bowel loops. Prominent colonic stool. No
evidence of pneumatosis or pneumoperitoneum. Visualized sternotomy
wires appear intact.
IMPRESSION: Enteric tube terminates near the junction of the third and fourth
portions of the duodenum.

## 2016-11-11 IMAGING — CR DG CHEST 1V PORT
1 series · 1 of 1 positions shown · non-contrast
Comparison: 01/07/2015.

CLINICAL DATA: Pneumonia.

EXAM:
PORTABLE CHEST 1 VIEW

[AP]
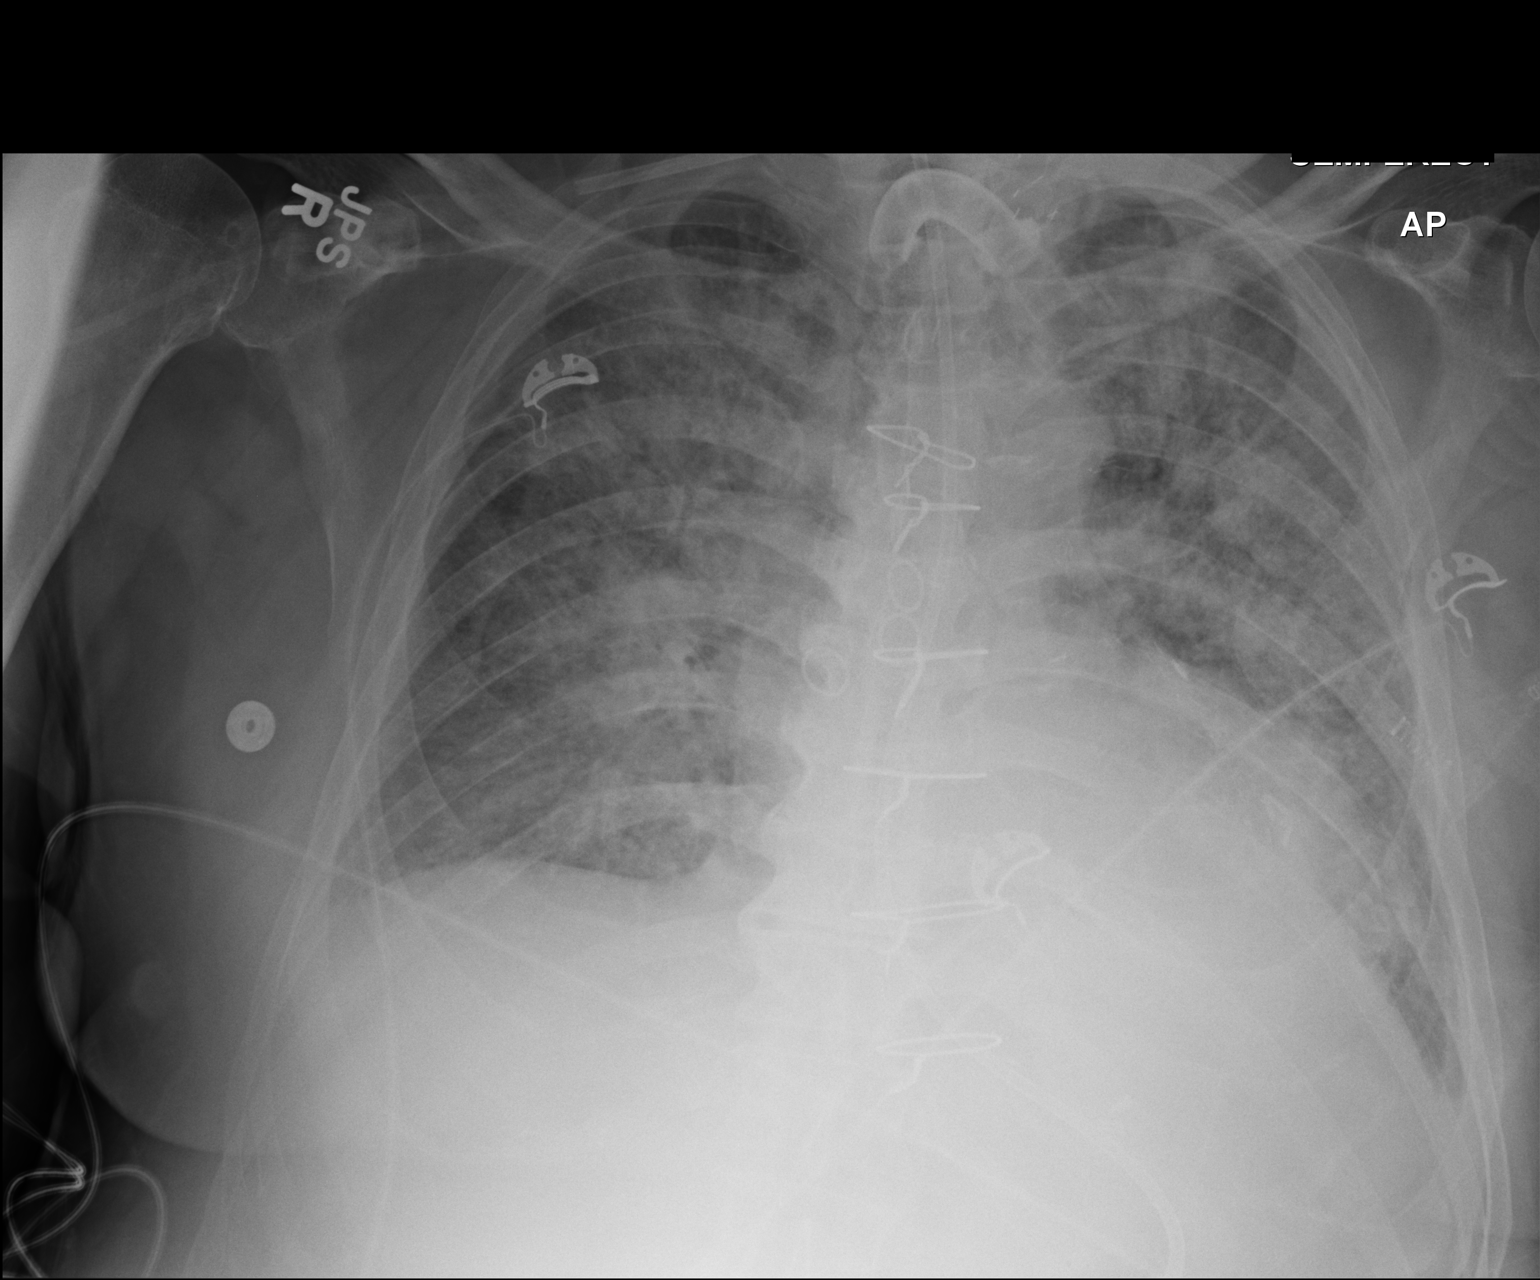

[1 of 1 positions shown; findings below may reference images not displayed]

FINDINGS: Tracheostomy tube in stable position. Feeding tube noted with tip
below left hemidiaphragm. Prior CABG. Stable cardiomegaly. Diffuse
dense bilateral pulmonary infiltrates again noted. No interim
change. No prominent pleural effusion. No pneumothorax.
IMPRESSION: 1. Tracheostomy tube in stable position. Feeding tube noted with tip
below left hemidiaphragm .

2.  Prior CABG.  Stable cardiomegaly.

3.  Persistent diffuse bilateral airspace disease.  No change.
